# Patient Record
Sex: Female | Born: 1980 | Hispanic: No | Smoking: Current every day smoker
Health system: Southern US, Community
[De-identification: ages and names within clinical notes are randomized; demographics above are authoritative.]

## PROBLEM LIST (undated history)

## (undated) DIAGNOSIS — N6489 Other specified disorders of breast: Secondary | ICD-10-CM

## (undated) DIAGNOSIS — M5030 Other cervical disc degeneration, unspecified cervical region: Secondary | ICD-10-CM

## (undated) DIAGNOSIS — IMO0002 Reserved for concepts with insufficient information to code with codable children: Secondary | ICD-10-CM

## (undated) DIAGNOSIS — K219 Gastro-esophageal reflux disease without esophagitis: Secondary | ICD-10-CM

## (undated) DIAGNOSIS — B373 Candidiasis of vulva and vagina: Secondary | ICD-10-CM

## (undated) DIAGNOSIS — Z889 Allergy status to unspecified drugs, medicaments and biological substances status: Secondary | ICD-10-CM

## (undated) DIAGNOSIS — R519 Headache, unspecified: Secondary | ICD-10-CM

## (undated) DIAGNOSIS — N870 Mild cervical dysplasia: Secondary | ICD-10-CM

## (undated) DIAGNOSIS — R51 Headache: Secondary | ICD-10-CM

## (undated) DIAGNOSIS — Z87440 Personal history of urinary (tract) infections: Secondary | ICD-10-CM

## (undated) DIAGNOSIS — R638 Other symptoms and signs concerning food and fluid intake: Secondary | ICD-10-CM

## (undated) DIAGNOSIS — G35D Multiple sclerosis, unspecified: Secondary | ICD-10-CM

## (undated) DIAGNOSIS — G4730 Sleep apnea, unspecified: Secondary | ICD-10-CM

## (undated) DIAGNOSIS — N926 Irregular menstruation, unspecified: Secondary | ICD-10-CM

## (undated) DIAGNOSIS — E079 Disorder of thyroid, unspecified: Secondary | ICD-10-CM

## (undated) DIAGNOSIS — H539 Unspecified visual disturbance: Secondary | ICD-10-CM

## (undated) DIAGNOSIS — L732 Hidradenitis suppurativa: Secondary | ICD-10-CM

## (undated) DIAGNOSIS — F411 Generalized anxiety disorder: Secondary | ICD-10-CM

## (undated) DIAGNOSIS — F909 Attention-deficit hyperactivity disorder, unspecified type: Secondary | ICD-10-CM

## (undated) DIAGNOSIS — M51369 Other intervertebral disc degeneration, lumbar region without mention of lumbar back pain or lower extremity pain: Secondary | ICD-10-CM

## (undated) DIAGNOSIS — M459 Ankylosing spondylitis of unspecified sites in spine: Secondary | ICD-10-CM

## (undated) DIAGNOSIS — N9089 Other specified noninflammatory disorders of vulva and perineum: Secondary | ICD-10-CM

## (undated) DIAGNOSIS — G90A Postural orthostatic tachycardia syndrome (POTS): Secondary | ICD-10-CM

## (undated) DIAGNOSIS — T4145XA Adverse effect of unspecified anesthetic, initial encounter: Secondary | ICD-10-CM

## (undated) DIAGNOSIS — R5383 Other fatigue: Secondary | ICD-10-CM

## (undated) DIAGNOSIS — T8859XA Other complications of anesthesia, initial encounter: Secondary | ICD-10-CM

## (undated) DIAGNOSIS — E010 Iodine-deficiency related diffuse (endemic) goiter: Secondary | ICD-10-CM

## (undated) DIAGNOSIS — G35 Multiple sclerosis: Secondary | ICD-10-CM

## (undated) DIAGNOSIS — N6019 Diffuse cystic mastopathy of unspecified breast: Secondary | ICD-10-CM

## (undated) DIAGNOSIS — Z8619 Personal history of other infectious and parasitic diseases: Secondary | ICD-10-CM

## (undated) HISTORY — DX: Disorder of thyroid, unspecified: E07.9

## (undated) HISTORY — DX: Mild cervical dysplasia: N87.0

## (undated) HISTORY — DX: Personal history of urinary (tract) infections: Z87.440

## (undated) HISTORY — DX: Personal history of other infectious and parasitic diseases: Z86.19

## (undated) HISTORY — DX: Candidiasis of vulva and vagina: B37.3

## (undated) HISTORY — DX: Other specified noninflammatory disorders of vulva and perineum: N90.89

## (undated) HISTORY — DX: Irregular menstruation, unspecified: N92.6

## (undated) HISTORY — DX: Iodine-deficiency related diffuse (endemic) goiter: E01.0

## (undated) HISTORY — DX: Generalized anxiety disorder: F41.1

## (undated) HISTORY — DX: Ankylosing spondylitis of unspecified sites in spine: M45.9

## (undated) HISTORY — DX: Other cervical disc degeneration, unspecified cervical region: M50.30

## (undated) HISTORY — DX: Gastro-esophageal reflux disease without esophagitis: K21.9

## (undated) HISTORY — DX: Headache, unspecified: R51.9

## (undated) HISTORY — DX: Other fatigue: R53.83

## (undated) HISTORY — DX: Reserved for concepts with insufficient information to code with codable children: IMO0002

## (undated) HISTORY — DX: Other symptoms and signs concerning food and fluid intake: R63.8

## (undated) HISTORY — DX: Adverse effect of unspecified anesthetic, initial encounter: T41.45XA

## (undated) HISTORY — DX: Other complications of anesthesia, initial encounter: T88.59XA

## (undated) HISTORY — DX: Other intervertebral disc degeneration, lumbar region without mention of lumbar back pain or lower extremity pain: M51.369

## (undated) HISTORY — DX: Diffuse cystic mastopathy of unspecified breast: N60.19

## (undated) HISTORY — DX: Other specified disorders of breast: N64.89

## (undated) HISTORY — DX: Allergy status to unspecified drugs, medicaments and biological substances: Z88.9

## (undated) HISTORY — DX: Sleep apnea, unspecified: G47.30

## (undated) HISTORY — DX: Headache: R51

## (undated) HISTORY — DX: Hidradenitis suppurativa: L73.2

## (undated) HISTORY — DX: Attention-deficit hyperactivity disorder, unspecified type: F90.9

## (undated) HISTORY — DX: Postural orthostatic tachycardia syndrome (POTS): G90.A

## (undated) HISTORY — DX: Unspecified visual disturbance: H53.9

---
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
## 1999-04-12 HISTORY — PX: WISDOM TOOTH EXTRACTION: SHX21

## 2000-12-05 ENCOUNTER — Encounter: Payer: Self-pay | Admitting: Internal Medicine

## 2000-12-05 ENCOUNTER — Emergency Department (HOSPITAL_COMMUNITY): Admission: EM | Admit: 2000-12-05 | Discharge: 2000-12-05 | Payer: Self-pay | Admitting: Emergency Medicine

## 2001-08-07 ENCOUNTER — Emergency Department (HOSPITAL_COMMUNITY): Admission: EM | Admit: 2001-08-07 | Discharge: 2001-08-08 | Payer: Self-pay | Admitting: Emergency Medicine

## 2005-04-11 DIAGNOSIS — N6019 Diffuse cystic mastopathy of unspecified breast: Secondary | ICD-10-CM

## 2005-04-11 DIAGNOSIS — IMO0002 Reserved for concepts with insufficient information to code with codable children: Secondary | ICD-10-CM

## 2005-04-11 DIAGNOSIS — R87619 Unspecified abnormal cytological findings in specimens from cervix uteri: Secondary | ICD-10-CM

## 2005-04-11 HISTORY — DX: Unspecified abnormal cytological findings in specimens from cervix uteri: R87.619

## 2005-04-11 HISTORY — DX: Diffuse cystic mastopathy of unspecified breast: N60.19

## 2005-04-11 HISTORY — DX: Reserved for concepts with insufficient information to code with codable children: IMO0002

## 2005-05-09 ENCOUNTER — Other Ambulatory Visit: Admission: RE | Admit: 2005-05-09 | Discharge: 2005-05-09 | Payer: Self-pay | Admitting: Obstetrics and Gynecology

## 2005-05-12 DIAGNOSIS — IMO0002 Reserved for concepts with insufficient information to code with codable children: Secondary | ICD-10-CM

## 2005-05-12 HISTORY — DX: Reserved for concepts with insufficient information to code with codable children: IMO0002

## 2005-06-09 DIAGNOSIS — N870 Mild cervical dysplasia: Secondary | ICD-10-CM

## 2005-06-09 HISTORY — DX: Mild cervical dysplasia: N87.0

## 2005-08-24 ENCOUNTER — Encounter: Admission: RE | Admit: 2005-08-24 | Discharge: 2005-08-24 | Payer: Self-pay | Admitting: Gastroenterology

## 2005-08-31 ENCOUNTER — Encounter: Admission: RE | Admit: 2005-08-31 | Discharge: 2005-08-31 | Payer: Self-pay | Admitting: Gastroenterology

## 2005-10-09 DIAGNOSIS — N926 Irregular menstruation, unspecified: Secondary | ICD-10-CM

## 2005-10-09 HISTORY — DX: Irregular menstruation, unspecified: N92.6

## 2005-10-27 ENCOUNTER — Ambulatory Visit (HOSPITAL_COMMUNITY): Admission: RE | Admit: 2005-10-27 | Discharge: 2005-10-28 | Payer: Self-pay | Admitting: General Surgery

## 2005-10-27 ENCOUNTER — Encounter (INDEPENDENT_AMBULATORY_CARE_PROVIDER_SITE_OTHER): Payer: Self-pay | Admitting: Specialist

## 2005-11-09 DIAGNOSIS — E010 Iodine-deficiency related diffuse (endemic) goiter: Secondary | ICD-10-CM

## 2005-11-09 HISTORY — DX: Iodine-deficiency related diffuse (endemic) goiter: E01.0

## 2005-12-06 ENCOUNTER — Other Ambulatory Visit: Admission: RE | Admit: 2005-12-06 | Discharge: 2005-12-06 | Payer: Self-pay | Admitting: Obstetrics and Gynecology

## 2005-12-28 ENCOUNTER — Encounter: Admission: RE | Admit: 2005-12-28 | Discharge: 2005-12-28 | Payer: Self-pay | Admitting: Obstetrics and Gynecology

## 2006-05-22 ENCOUNTER — Encounter: Admission: RE | Admit: 2006-05-22 | Discharge: 2006-05-22 | Payer: Self-pay | Admitting: Endocrinology

## 2006-07-11 DIAGNOSIS — N9089 Other specified noninflammatory disorders of vulva and perineum: Secondary | ICD-10-CM

## 2006-07-11 DIAGNOSIS — B373 Candidiasis of vulva and vagina: Secondary | ICD-10-CM

## 2006-07-11 DIAGNOSIS — B3731 Acute candidiasis of vulva and vagina: Secondary | ICD-10-CM

## 2006-07-11 HISTORY — DX: Other specified noninflammatory disorders of vulva and perineum: N90.89

## 2006-07-11 HISTORY — DX: Candidiasis of vulva and vagina: B37.3

## 2006-07-11 HISTORY — DX: Acute candidiasis of vulva and vagina: B37.31

## 2007-01-10 DIAGNOSIS — N6489 Other specified disorders of breast: Secondary | ICD-10-CM

## 2007-01-10 HISTORY — DX: Other specified disorders of breast: N64.89

## 2007-01-17 ENCOUNTER — Encounter: Admission: RE | Admit: 2007-01-17 | Discharge: 2007-01-17 | Payer: Self-pay | Admitting: Obstetrics and Gynecology

## 2009-04-11 HISTORY — PX: DILATION AND CURETTAGE OF UTERUS: SHX78

## 2009-09-03 ENCOUNTER — Inpatient Hospital Stay (HOSPITAL_COMMUNITY): Admission: AD | Admit: 2009-09-03 | Discharge: 2009-09-06 | Payer: Self-pay | Admitting: Obstetrics and Gynecology

## 2009-09-23 ENCOUNTER — Ambulatory Visit (HOSPITAL_COMMUNITY): Admission: AD | Admit: 2009-09-23 | Discharge: 2009-09-24 | Payer: Self-pay | Admitting: Obstetrics and Gynecology

## 2009-09-23 ENCOUNTER — Encounter (INDEPENDENT_AMBULATORY_CARE_PROVIDER_SITE_OTHER): Payer: Self-pay | Admitting: Obstetrics and Gynecology

## 2010-05-02 ENCOUNTER — Encounter: Payer: Self-pay | Admitting: Endocrinology

## 2010-06-27 LAB — CBC
HCT: 31.2 % — ABNORMAL LOW (ref 36.0–46.0)
Hemoglobin: 10.9 g/dL — ABNORMAL LOW (ref 12.0–15.0)
MCHC: 34.1 g/dL (ref 30.0–36.0)
MCV: 93.1 fL (ref 78.0–100.0)
MCV: 93.6 fL (ref 78.0–100.0)
RBC: 3.35 MIL/uL — ABNORMAL LOW (ref 3.87–5.11)
WBC: 8 10*3/uL (ref 4.0–10.5)

## 2010-06-27 LAB — DIFFERENTIAL
Basophils Absolute: 0 10*3/uL (ref 0.0–0.1)
Basophils Absolute: 0 10*3/uL (ref 0.0–0.1)
Basophils Relative: 0 % (ref 0–1)
Basophils Relative: 0 % (ref 0–1)
Eosinophils Absolute: 0 10*3/uL (ref 0.0–0.7)
Eosinophils Absolute: 0 10*3/uL (ref 0.0–0.7)
Lymphocytes Relative: 10 % — ABNORMAL LOW (ref 12–46)
Lymphocytes Relative: 9 % — ABNORMAL LOW (ref 12–46)
Lymphs Abs: 1 10*3/uL (ref 0.7–4.0)
Neutro Abs: 7.2 10*3/uL (ref 1.7–7.7)
Neutro Abs: 9.2 10*3/uL — ABNORMAL HIGH (ref 1.7–7.7)
Neutrophils Relative %: 89 % — ABNORMAL HIGH (ref 43–77)

## 2010-06-27 LAB — URINE MICROSCOPIC-ADD ON

## 2010-06-27 LAB — URINALYSIS, ROUTINE W REFLEX MICROSCOPIC
Bilirubin Urine: NEGATIVE
Leukocytes, UA: NEGATIVE
Specific Gravity, Urine: >1.03 — ABNORMAL HIGH (ref 1.005–1.030)

## 2010-06-28 LAB — CBC
Hemoglobin: 10.5 g/dL — ABNORMAL LOW (ref 12.0–15.0)
Hemoglobin: 8.4 g/dL — ABNORMAL LOW (ref 12.0–15.0)
MCHC: 35.2 g/dL (ref 30.0–36.0)
MCHC: 35.5 g/dL (ref 30.0–36.0)
MCV: 94.1 fL (ref 78.0–100.0)
Platelets: 197 10*3/uL (ref 150–400)
Platelets: 240 10*3/uL (ref 150–400)
RBC: 2.53 MIL/uL — ABNORMAL LOW (ref 3.87–5.11)
RBC: 3.19 MIL/uL — ABNORMAL LOW (ref 3.87–5.11)
WBC: 18.1 10*3/uL — ABNORMAL HIGH (ref 4.0–10.5)
WBC: 26.2 10*3/uL — ABNORMAL HIGH (ref 4.0–10.5)

## 2010-08-27 NOTE — Op Note (Signed)
NAME:  Brittney Harrison, Brittney Harrison NO.:  [id number]   MEDICAL RECORD NO.:  [id number]          PATIENT TYPE:  OIB   LOCATION:  6735                         FACILITY:  [facility]   PHYSICIAN:  Cherylynn Ridges, M.D.    DATE OF BIRTH:  04/18/1980   DATE OF PROCEDURE:  10/27/2005  DATE OF DISCHARGE:                                 OPERATIVE REPORT   PREOPERATIVE DIAGNOSIS:  Symptomatic biliary dyskinesia.   POSTOPERATIVE DIAGNOSIS:  Symptomatic biliary dyskinesia.   PROCEDURE:  Laparoscopic cholecystectomy with cholangiogram.   SURGEON:  Dr. Lindie Spruce.   ANESTHESIA:  General endotracheal.   ESTIMATED BLOOD LOSS:  Less than 20 mL.   COMPLICATIONS:  None.   CONDITION:  Stable.   INDICATIONS FOR OPERATION:  The patient is a 30 year old with symptomatic  gallbladder disease and biliary dyskinesia, who now comes in for  laparoscopic cholecystectomy.   FINDINGS:  The patient had some adhesions to the gallbladder from the  omentum.  No evidence of acute cholecystitis with chronic cholecystitis.   OPERATION:  The patient was taken to the operating room, placed on table in  supine position.  After an adequate general anesthetic was administered, she  was prepped and draped in usual sterile manner, exposing the midline and the  right upper quadrant.   A supraumbilical curvilinear incision was made, using #11 blade, and taken  down to the midline fascia.  It was at this level that the fascia was  incised, using #15 blade, and a Kocher clamp placed on each edge of the  incised fascia.  We then bluntly dissected down into the peritoneal cavity.  Once this was done, pursestring suture of 0 Vicryl was passed into the  fascia.  This used to secure in the Emily cannula which was subsequent  passed into the peritoneal cavity.  We insufflated carbon dioxide gas up to  maximal intra-abdominal pressure of 15 mmHg, after the pursestring suture  was in place.  Two right costal margin 5-mm cannulas  in the subxiphoid, the  11/12 mm cannula passed under direct vision.  Once all cannulas were in  place, the patient was placed reverse Trendelenburg.  The gallbladder was  inspected.  We put her left-side down and the dissection begun.   A ratcheted grasper was passed on the gallbladder from the lateral-most  cannula site.  We used it to retract the gallbladder towards the anterior  abdominal wall and the right upper quadrant.  We placed the second grasper  on the infundibulum of the gallbladder and then dissected out the peritoneum  overlying the triangle of Calot and the hepatic duodenal triangle.  We  dissected out the cystic duct, placed a proximal clip along the gallbladder  side, and then made a cholecystodochotomy using laparoscopic scissors.  Once  cholecystodochotomy was made, a Cook catheter was passed through the  anterior abdominal wall and passed into the cholecystodochotomy where it was  secured in place with an Endoclip.  Cholangiogram showed good flow into the  duodenum, good proximal filling, no evidence of filling defects and no  dilatation, and there was  good flow into the duodenum.  Once this was  completed, we removed the clip, removed the cholangiocatheter, and then Endo  clipped the distal cystic duct with minimal difficulty.  We transected the  cystic duct, dissected out the cystic artery, clipped it proximally and  distally, and then transected it.  We dissected out the gallbladder from its  bed with minimal difficulty.  We retrieved it from the supraumbilical site  using a large grasper.  All counts were correct.  We aspirated and irrigated  with saline solution and then removed all cannulas.   The supraumbilical fascial site was closed using the pursestring suture  which was left in place.  The skin at all sites were closed using running  subcuticular stitch of 4-0 Vicryl. Steri-Strips were applied, along with  sterile dressings, and 0.25% Marcaine with epi was  injected at all sites.  All needle, sponge counts and instrument counts were correct.      Cherylynn Ridges, M.D.  Electronically Signed     JOW/MEDQ  D:  10/27/2005  T:  10/27/2005  Job:  [id number]

## 2011-04-12 NOTE — L&D Delivery Note (Signed)
Delivery Note At 4:09 AM a viable female, "Brittney Harrison", was delivered via Vaginal, Spontaneous Delivery (Presentation: ; Occiput Anterior).  APGAR: 8, 9; weight .   Placenta status: Intact, Spontaneous.  Cord: 3 vessels with the following complications: None.  Cord pH: NA Uterus boggy right after placental delivery.  Firmed up with massage, and Cytotech 1000 mcg placed in the rectum for prophylaxis. Methergine 0.2 mg IM given as adjunct treatment.  Uterus firm after that, and bleeding overall WNL.  Anesthesia: Local  Episiotomy: None Lacerations: 2nd degree Suture Repair: 3.0 monocryl Est. Blood Loss (mL): 400 cc  Mom to postpartum.  Baby to skin to skin.[name]  Nigel Bridgeman 11/19/2011, 4:52 AM

## 2011-04-20 LAB — OB RESULTS CONSOLE GC/CHLAMYDIA
Chlamydia: NEGATIVE
Gonorrhea: NEGATIVE

## 2011-06-07 ENCOUNTER — Encounter (INDEPENDENT_AMBULATORY_CARE_PROVIDER_SITE_OTHER): Payer: BC Managed Care – PPO | Admitting: Obstetrics and Gynecology

## 2011-06-07 DIAGNOSIS — Z331 Pregnant state, incidental: Secondary | ICD-10-CM

## 2011-06-29 ENCOUNTER — Other Ambulatory Visit: Payer: Self-pay | Admitting: Obstetrics and Gynecology

## 2011-06-29 ENCOUNTER — Encounter (INDEPENDENT_AMBULATORY_CARE_PROVIDER_SITE_OTHER): Payer: BC Managed Care – PPO | Admitting: Obstetrics and Gynecology

## 2011-06-29 ENCOUNTER — Other Ambulatory Visit: Payer: BC Managed Care – PPO

## 2011-06-29 DIAGNOSIS — Z1389 Encounter for screening for other disorder: Secondary | ICD-10-CM

## 2011-06-29 DIAGNOSIS — Z3480 Encounter for supervision of other normal pregnancy, unspecified trimester: Secondary | ICD-10-CM

## 2011-06-30 ENCOUNTER — Ambulatory Visit (HOSPITAL_COMMUNITY): Admission: RE | Admit: 2011-06-30 | Payer: Self-pay | Source: Ambulatory Visit

## 2011-07-01 ENCOUNTER — Ambulatory Visit (HOSPITAL_COMMUNITY)
Admission: RE | Admit: 2011-07-01 | Discharge: 2011-07-01 | Disposition: A | Discharge status: Home / Self Care | Payer: BC Managed Care – PPO | Source: Ambulatory Visit | Attending: Obstetrics and Gynecology | Admitting: Obstetrics and Gynecology

## 2011-07-01 DIAGNOSIS — O358XX0 Maternal care for other (suspected) fetal abnormality and damage, not applicable or unspecified: Secondary | ICD-10-CM | POA: Insufficient documentation

## 2011-07-01 DIAGNOSIS — Z1389 Encounter for screening for other disorder: Secondary | ICD-10-CM | POA: Insufficient documentation

## 2011-07-01 DIAGNOSIS — Z363 Encounter for antenatal screening for malformations: Secondary | ICD-10-CM | POA: Insufficient documentation

## 2011-07-20 LAB — THYROID PANEL WITH TSH
Free T4: 0.68
TSH: 0.43

## 2011-07-20 LAB — T4, FREE: T4,Free (Direct): 0.68

## 2011-07-21 ENCOUNTER — Other Ambulatory Visit: Payer: Self-pay | Admitting: Obstetrics and Gynecology

## 2011-07-22 LAB — GLUCOSE TOLERANCE, 3 HOURS
Glucose Tolerance, 1 hour: 193 mg/dL — ABNORMAL HIGH (ref 70–189)
Glucose, GTT - 3 Hour: 91 mg/dL (ref 70–144)

## 2011-07-22 NOTE — Progress Notes (Signed)
Quick Note:  Pt barely based her 3hr GTT. Please refer her for nutrition mgmt for diabetic diet and I rec u/s q 4wks in 3rd tri for EFW. Thanks ______

## 2011-07-27 ENCOUNTER — Encounter: Payer: Self-pay | Admitting: Obstetrics and Gynecology

## 2011-07-27 ENCOUNTER — Ambulatory Visit (INDEPENDENT_AMBULATORY_CARE_PROVIDER_SITE_OTHER): Payer: BC Managed Care – PPO | Admitting: Obstetrics and Gynecology

## 2011-07-27 ENCOUNTER — Encounter: Payer: BC Managed Care – PPO | Admitting: Obstetrics and Gynecology

## 2011-07-27 VITALS — BP 108/70 | Ht 68.0 in | Wt 223.0 lb

## 2011-07-27 DIAGNOSIS — G43909 Migraine, unspecified, not intractable, without status migrainosus: Secondary | ICD-10-CM

## 2011-07-27 DIAGNOSIS — K589 Irritable bowel syndrome without diarrhea: Secondary | ICD-10-CM | POA: Insufficient documentation

## 2011-07-27 DIAGNOSIS — Z720 Tobacco use: Secondary | ICD-10-CM | POA: Insufficient documentation

## 2011-07-27 DIAGNOSIS — K219 Gastro-esophageal reflux disease without esophagitis: Secondary | ICD-10-CM

## 2011-07-27 DIAGNOSIS — J45909 Unspecified asthma, uncomplicated: Secondary | ICD-10-CM | POA: Insufficient documentation

## 2011-07-27 DIAGNOSIS — E0590 Thyrotoxicosis, unspecified without thyrotoxic crisis or storm: Secondary | ICD-10-CM | POA: Insufficient documentation

## 2011-07-27 DIAGNOSIS — O09299 Supervision of pregnancy with other poor reproductive or obstetric history, unspecified trimester: Secondary | ICD-10-CM

## 2011-07-27 DIAGNOSIS — Z331 Pregnant state, incidental: Secondary | ICD-10-CM

## 2011-07-27 DIAGNOSIS — E079 Disorder of thyroid, unspecified: Secondary | ICD-10-CM

## 2011-07-27 MED ORDER — HYDROXYZINE PAMOATE 25 MG PO CAPS: 25.0000 mg | ORAL_CAPSULE | Freq: Every evening | ORAL | Status: AC | PRN

## 2011-07-27 NOTE — Patient Instructions (Signed)
Diabetes Meal Planning Guide The diabetes meal planning guide is a tool to help you plan your meals and snacks. It is important for people with diabetes to manage their blood glucose (sugar) levels. Choosing the right foods and the right amounts throughout your day will help control your blood glucose. Eating right can even help you improve your blood pressure and reach or maintain a healthy weight. CARBOHYDRATE COUNTING MADE EASY When you eat carbohydrates, they turn to sugar. This raises your blood glucose level. Counting carbohydrates can help you control this level so you feel better. When you plan your meals by counting carbohydrates, you can have more flexibility in what you eat and balance your medicine with your food intake. Carbohydrate counting simply means adding up the total amount of carbohydrate grams in your meals and snacks. Try to eat about the same amount at each meal. Foods with carbohydrates are listed below. Each portion below is 1 carbohydrate serving or 15 grams of carbohydrates. Ask your dietician how many grams of carbohydrates you should eat at each meal or snack. Grains and Starches  1 slice bread.    English muffin or hotdog/hamburger bun.    cup cold cereal (unsweetened).   ? cup cooked pasta or rice.    cup starchy vegetables (corn, potatoes, peas, beans, winter squash).   1 tortilla (6 inches).    bagel.   1 waffle or pancake (size of a CD).    cup cooked cereal.   4 to 6 small crackers.  *Whole grain is recommended. Fruit  1 cup fresh unsweetened berries, melon, papaya, pineapple.   1 small fresh fruit.    banana or mango.    cup fruit juice (4 oz unsweetened).    cup canned fruit in natural juice or water.   2 tbs dried fruit.   12 to 15 grapes or cherries.  Milk and Yogurt  1 cup fat-free or 1% milk.   1 cup soy milk.   6 oz light yogurt with sugar-free sweetener.   6 oz low-fat soy yogurt.   6 oz plain yogurt.    Vegetables  1 cup raw or  cup cooked is counted as 0 carbohydrates or a "free" food.   If you eat 3 or more servings at 1 meal, count them as 1 carbohydrate serving.  Other Carbohydrates   oz chips or pretzels.    cup ice cream or frozen yogurt.    cup sherbet or sorbet.   2 inch square cake, no frosting.   1 tbs honey, sugar, jam, jelly, or syrup.   2 small cookies.   3 squares of graham crackers.   3 cups popcorn.   6 crackers.   1 cup broth-based soup.   Count 1 cup casserole or other mixed foods as 2 carbohydrate servings.   Foods with less than 20 calories in a serving may be counted as 0 carbohydrates or a "free" food.  You may want to purchase a book or computer software that lists the carbohydrate gram counts of different foods. In addition, the nutrition facts panel on the labels of the foods you eat are a good source of this information. The label will tell you how big the serving size is and the total number of carbohydrate grams you will be eating per serving. Divide this number by 15 to obtain the number of carbohydrate servings in a portion. Remember, 1 carbohydrate serving equals 15 grams of carbohydrate. SERVING SIZES Measuring foods and serving sizes  helps you make sure you are getting the right amount of food. The list below tells how big or small some common serving sizes are.  1 oz.........4 stacked dice.   3 oz........Marland KitchenDeck of cards.   1 tsp.......Marland KitchenTip of little finger.   1 tbs......Marland KitchenMarland KitchenThumb.   2 tbs.......Marland KitchenGolf ball.    cup......Marland KitchenHalf of a fist.   1 cup.......Marland KitchenA fist.  SAMPLE DIABETES MEAL PLAN Below is a sample meal plan that includes foods from the grain and starches, dairy, vegetable, fruit, and meat groups. A dietician can individualize a meal plan to fit your calorie needs and tell you the number of servings needed from each food group. However, controlling the total amount of carbohydrates in your meal or snack is more important than  making sure you include all of the food groups at every meal. You may interchange carbohydrate containing foods (dairy, starches, and fruits). The meal plan below is an example of a 2000 calorie diet using carbohydrate counting. This meal plan has 17 carbohydrate servings. Breakfast  1 cup oatmeal (2 carb servings).    cup light yogurt (1 carb serving).   1 cup blueberries (1 carb serving).    cup almonds.  Snack  1 large apple (2 carb servings).   1 low-fat string cheese stick.  Lunch  Chicken breast salad.   1 cup spinach.    cup chopped tomatoes.   2 oz chicken breast, sliced.   2 tbs low-fat [place] dressing.   12 whole-wheat crackers (2 carb servings).   12 to 15 grapes (1 carb serving).   1 cup low-fat milk (1 carb serving).  Snack  1 cup carrots.    cup hummus (1 carb serving).  Dinner  3 oz broiled salmon.   1 cup brown rice (3 carb servings).  Snack  1  cups steamed broccoli (1 carb serving) drizzled with 1 tsp olive oil and lemon juice.   1 cup light pudding (2 carb servings).  DIABETES MEAL PLANNING WORKSHEET Your dietician can use this worksheet to help you decide how many servings of foods and what types of foods are right for you.  BREAKFAST Food Group and Servings / Carb Servings Grain/Starches __________________________________ Dairy __________________________________________ Vegetable ______________________________________ Fruit ___________________________________________ Meat __________________________________________ Fat ____________________________________________ LUNCH Food Group and Servings / Carb Servings Grain/Starches ___________________________________ Dairy ___________________________________________ Fruit ____________________________________________ Meat ___________________________________________ Fat _____________________________________________ Laural Golden Food Group and Servings / Carb Servings Grain/Starches  ___________________________________ Dairy ___________________________________________ Fruit ____________________________________________ Meat ___________________________________________ Fat _____________________________________________ SNACKS Food Group and Servings / Carb Servings Grain/Starches ___________________________________ Dairy ___________________________________________ Vegetable _______________________________________ Fruit ____________________________________________ Meat ___________________________________________ Fat _____________________________________________ DAILY TOTALS Starches _________________________ Vegetable ________________________ Fruit ____________________________ Dairy ____________________________ Meat ____________________________ Fat ______________________________ Document Released: 12/23/2004 Document Revised: 03/17/2011 Document Reviewed: 11/03/2008 [employer] Patient Information 2012 [place], [place].High Protein Diet A high protein diet means that high protein foods are added to your diet. Getting more protein in the diet is important for a number of reasons. Protein helps the body to build tissue, muscle, and to repair damage. People who have had surgery, injuries such as broken bones, infections, and burns, or illnesses such as cancer, may need more protein in their diet.  SERVING SIZES Measuring foods and serving sizes helps to make sure you are getting the right amount of food. The list below tells how big or small some common serving sizes are.   1 oz.........4 stacked dice.   3 oz........Marland KitchenDeck of cards.   1 tsp.......Marland KitchenTip of little finger.   1 tbs......Marland KitchenMarland KitchenThumb.   2 tbs.......Marland KitchenGolf ball.  cup......Marland KitchenHalf of a fist.   1 cup.......Marland KitchenA fist.  FOOD SOURCES OF PROTEIN Listed below are some food sources of protein and the amount of protein they contain. Your Registered Dietitian can calculate how many grams of protein you need for your medical  condition. High protein foods can be added to the diet at mealtime or as snacks. Be sure to have at least 1 protein-containing food at each meal and snack to ensure adequate intake.  Meats and Meat Substitutes / Protein (g)  3 oz poultry (chicken, [place]) / 26 g   3 oz tuna, canned in water / 26 g   3 oz fish (cod) / 21 g   3 oz red meat (beef, pork) / 21 g   4 oz tofu / 9 g   1 egg / 6 g    cup egg substitute / 5 g   1 cup dried beans / 15 g   1 cup soy milk / 4 g  Dairy / Protein (g)  1 cup milk (skim, 1%, 2%, whole) / 8 g    cup evaporated milk / 9 g   1 cup buttermilk / 8 g   1 cup low-fat plain yogurt / 11 g   1 cup regular plain yogurt / 9 g    cup cottage cheese / 14 g   1 oz cheddar cheese / 7 g  Nuts / Protein (g)  2 tbs peanut butter / 8 g   1 oz peanuts / 7 g   2 tbs cashews / 5 g   2 tbs almonds / 5 g  Document Released: 03/28/2005 Document Revised: 03/17/2011 Document Reviewed: 12/29/2006 [facility] Patient Information 2012 [place], [place].

## 2011-07-27 NOTE — Progress Notes (Signed)
Addended by: Malissa Hippo. on: 07/27/2011 03:14 PM   Modules accepted: Orders

## 2011-07-27 NOTE — Progress Notes (Signed)
Assessment and plan reviewed. Suggest referral to [facility] now.

## 2011-07-27 NOTE — Progress Notes (Signed)
Rv'd 3hr gtt results Fast - 104 1hr=193 elevated 2hr = 162 normal 3hr - 91 normal  Random CBG today=157   Discussed diabetic diet and increase exercise Will repeat 3 hr gtt at 28 weeks Pt tearful secondary to not sleeping well, increase stress from work   A: [redacted]w[redacted]d tra glucosuria  Hx LGA Elevated early 1hr gtt - 3hr -1 value elvated Per pt had thyroid labs done at [facility] endocrine Dr Lucianne Muss - will get results faxed  P: watch FH,  Repeat 3hr gtt at 28wks Pt to see Dr Lucianne Muss at 6wks PP  Rv'd PTL s/s  Rx vistaril

## 2011-07-28 ENCOUNTER — Telehealth: Payer: Self-pay | Admitting: Obstetrics and Gynecology

## 2011-07-28 NOTE — Telephone Encounter (Signed)
Message copied by Mason Jim on Thu Jul 28, 2011  8:45 AM ------      Message from: Malissa Hippo.      Created: Wed Jul 27, 2011  3:14 PM      Regarding: getting thyroid labs       Pt had labs drawn at [facility], Dr Lucianne Muss on 07-22-11      Please get results for chart

## 2011-07-28 NOTE — Telephone Encounter (Signed)
TC to Dr. Remus Blake office.  Requested results from most recent thyroid testing.  Will fax.

## 2011-08-05 ENCOUNTER — Telehealth: Payer: Self-pay

## 2011-08-05 ENCOUNTER — Other Ambulatory Visit: Payer: Self-pay

## 2011-08-05 DIAGNOSIS — O24419 Gestational diabetes mellitus in pregnancy, unspecified control: Secondary | ICD-10-CM

## 2011-08-05 NOTE — Telephone Encounter (Signed)
Order for Nutrition Diabetes diet management was sent thru epic. Pt will be contacted thru Nutrition center. PG CMA

## 2011-08-24 ENCOUNTER — Encounter: Payer: Self-pay | Admitting: Obstetrics and Gynecology

## 2011-08-24 ENCOUNTER — Ambulatory Visit (INDEPENDENT_AMBULATORY_CARE_PROVIDER_SITE_OTHER): Payer: BC Managed Care – PPO | Admitting: Obstetrics and Gynecology

## 2011-08-24 ENCOUNTER — Other Ambulatory Visit: Payer: Self-pay | Admitting: Obstetrics and Gynecology

## 2011-08-24 VITALS — BP 110/78 | Wt 230.0 lb

## 2011-08-24 DIAGNOSIS — Z3490 Encounter for supervision of normal pregnancy, unspecified, unspecified trimester: Secondary | ICD-10-CM

## 2011-08-24 DIAGNOSIS — O368190 Decreased fetal movements, unspecified trimester, not applicable or unspecified: Secondary | ICD-10-CM

## 2011-08-24 NOTE — Progress Notes (Signed)
C/o some days the baby doesn't move at all

## 2011-08-24 NOTE — Progress Notes (Signed)
Decreased FM some days. Repeated 3 hr GTT today--patient not willing to do diabetic testing, etc., unless verified as GDM by 3 hour GTT.   Reviewed recommendation for biweekly NST or weekly BPP for recurrent episodes of decreased FM.  Patient will review cost of each option and Decide which is more acceptable to her.  NST today = reactive, no contractions. After review, patient advises she has been taking Melatonin most nights to help with sleep, with decreased movement noted since beginning that regimen. Recommended to stop Melatonin and observe movement. If issue resolves, no antenatal testing needed. Patient will monitor movement and advise [place] if still notes decrease from previous level.

## 2011-08-25 LAB — GLUCOSE TOLERANCE, 3 HOURS: Glucose Tolerance, 2 hour: 149 mg/dL (ref 70–164)

## 2011-09-02 DIAGNOSIS — Z720 Tobacco use: Secondary | ICD-10-CM

## 2011-09-06 ENCOUNTER — Ambulatory Visit (INDEPENDENT_AMBULATORY_CARE_PROVIDER_SITE_OTHER): Payer: BC Managed Care – PPO | Admitting: Obstetrics and Gynecology

## 2011-09-06 ENCOUNTER — Encounter: Payer: Self-pay | Admitting: Obstetrics and Gynecology

## 2011-09-06 VITALS — BP 128/68 | Wt 230.0 lb

## 2011-09-06 DIAGNOSIS — Z331 Pregnant state, incidental: Secondary | ICD-10-CM

## 2011-09-06 NOTE — Progress Notes (Signed)
Pt has no complaints today / JM

## 2011-09-06 NOTE — Progress Notes (Signed)
Glucola, RPR, hemoglobin today. Return to office in 2 weeks. 3 hour GTT within normal limits. Lots of stress at work.  Support offered.

## 2011-09-13 ENCOUNTER — Other Ambulatory Visit: Payer: Self-pay | Admitting: Obstetrics and Gynecology

## 2011-09-13 ENCOUNTER — Inpatient Hospital Stay (HOSPITAL_COMMUNITY)
Admission: AD | Admit: 2011-09-13 | Discharge: 2011-09-13 | Disposition: A | Discharge status: Home / Self Care | Payer: BC Managed Care – PPO | Source: Ambulatory Visit | Attending: Obstetrics and Gynecology | Admitting: Obstetrics and Gynecology

## 2011-09-13 ENCOUNTER — Telehealth: Payer: Self-pay | Admitting: Obstetrics and Gynecology

## 2011-09-13 ENCOUNTER — Encounter (HOSPITAL_COMMUNITY): Payer: Self-pay | Admitting: *Deleted

## 2011-09-13 DIAGNOSIS — R0602 Shortness of breath: Secondary | ICD-10-CM | POA: Insufficient documentation

## 2011-09-13 DIAGNOSIS — O99891 Other specified diseases and conditions complicating pregnancy: Secondary | ICD-10-CM | POA: Insufficient documentation

## 2011-09-13 DIAGNOSIS — Z331 Pregnant state, incidental: Secondary | ICD-10-CM

## 2011-09-13 DIAGNOSIS — J45901 Unspecified asthma with (acute) exacerbation: Secondary | ICD-10-CM

## 2011-09-13 DIAGNOSIS — J45909 Unspecified asthma, uncomplicated: Secondary | ICD-10-CM | POA: Insufficient documentation

## 2011-09-13 MED ORDER — ALBUTEROL SULFATE (2.5 MG/3ML) 0.083% IN NEBU
2.5000 mg | INHALATION_SOLUTION | RESPIRATORY_TRACT | Status: DC | PRN
  Filled 2011-09-13: qty 0.5
  Filled 2011-09-13: qty 3

## 2011-09-13 MED ORDER — ALBUTEROL SULFATE (5 MG/ML) 0.5% IN NEBU
2.5000 mg | INHALATION_SOLUTION | Freq: Once | RESPIRATORY_TRACT | Status: AC
  Administered 2011-09-13: 2.5 mg via RESPIRATORY_TRACT

## 2011-09-13 MED ORDER — FLUTICASONE-SALMETEROL 100-50 MCG/DOSE IN AEPB: 1.0000 | INHALATION_SPRAY | Freq: Two times a day (BID) | RESPIRATORY_TRACT | Status: DC

## 2011-09-13 MED ORDER — IPRATROPIUM BROMIDE 0.02 % IN SOLN
0.5000 mg | RESPIRATORY_TRACT | Status: DC | PRN
  Administered 2011-09-13 (×2): 0.5 mg via RESPIRATORY_TRACT
  Filled 2011-09-13 (×2): qty 2.5

## 2011-09-13 MED ORDER — ALBUTEROL SULFATE (5 MG/ML) 0.5% IN NEBU
INHALATION_SOLUTION | RESPIRATORY_TRACT | Status: AC
  Filled 2011-09-13: qty 0.5

## 2011-09-13 MED ORDER — PREDNISONE 50 MG PO TABS
60.0000 mg | ORAL_TABLET | Freq: Once | ORAL | Status: AC
  Administered 2011-09-13: 60 mg via ORAL
  Filled 2011-09-13: qty 1

## 2011-09-13 NOTE — MAU Provider Note (Signed)
History     CSN: [id number]  Arrival date & time 09/13/11  1519   None     Chief Complaint  Patient presents with  . Shortness of Breath    HPI  15:32 pm Brittney Harrison is a 31 y.o. female @ [redacted]w[redacted]d gestation who presents to MAU for asthma attack. She contributes the attack this morning to dust where her husband was moving and cleaning things. The attack started approximately 12 noon. She used her albuterol inhaler 3 puffs and it helped the wheezing a little but continues to feel short of breath. The history was provided by the patient.   Past Medical History  Diagnosis Date  . Thyroid disease     Fluxuate between hyper to hypo  . Asthma   . H/O seasonal allergies   . Hx: UTI (urinary tract infection)   . GERD (gastroesophageal reflux disease)   . Increased BMI   . Headache     migraines   . Complication of anesthesia     severe vomiting  . H/O varicella   . Fibrocystic breast 2007  . Fatigue   . 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 Irregular bleeding 10/2005  . 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 Yeast vaginitis 07/2006  . Clitoral irritation 07/2006  . Fullness of breast 01/2007    right    Past Surgical History  Procedure Date  . Cholecystectomy 09/11/91  . Dilation and curettage of uterus   . Tonsillectomy 10/28/97    Family History  Problem Relation Age of Onset  . Heart disease Father   . Alcohol abuse Father   . Cancer Maternal Aunt     Thyroid & breast  . Cancer Paternal Uncle     Esophageal  . Diabetes Maternal Grandmother   . Cancer Maternal Grandmother     Kidney  . Cancer Paternal Grandmother     History  Substance Use Topics  . Smoking status: Former Smoker -- 1.0 packs/day for 10 years    Types: Cigarettes    Quit date: 02/13/2011  . Smokeless tobacco: Never Used  . Alcohol Use: No    OB History    Grav Para Term Preterm Abortions TAB SAB Ect Mult Living   3 1 1  1 1    1       Review of  Systems  Constitutional: Negative for fever, chills, diaphoresis and fatigue.  HENT: Negative for ear pain, congestion, sore throat, facial swelling, neck pain, neck stiffness, dental problem and sinus pressure.   Eyes: Negative for photophobia, pain and discharge.  Respiratory: Positive for cough, chest tightness, shortness of breath and wheezing.   Gastrointestinal: Negative for nausea, vomiting, abdominal pain, diarrhea, constipation and abdominal distention.  Genitourinary: Negative for dysuria, frequency, flank pain, vaginal bleeding, vaginal discharge, difficulty urinating and pelvic pain.  Musculoskeletal: Negative for myalgias, back pain and gait problem.  Skin: Negative for color change and rash.  Neurological: Negative for dizziness, speech difficulty, weakness, light-headedness, numbness and headaches.  Psychiatric/Behavioral: Negative for confusion and agitation. The patient is not nervous/anxious.     Allergies  Codeine and Eye-sed ophthalmic  Home Medications  No current outpatient prescriptions on file.  Breastfeeding? Unknown  Physical Exam  Nursing note and vitals reviewed. Constitutional: She is oriented to person, place, and time. She appears well-developed and well-nourished. No distress.  HENT:  Head: Normocephalic.  Eyes: EOM are normal.  Neck: Normal range of motion. Neck  supple.  Cardiovascular: Normal rate.   Pulmonary/Chest: No respiratory distress.       Breath sound slightly decreased bilateral, no wheezing hear. Prolonged expirations.  Abdominal: Soft. There is no tenderness.       Gravid consistent with dates.  Musculoskeletal: Normal range of motion.  Neurological: She is alert and oriented to person, place, and time. No cranial nerve deficit.  Skin: Skin is warm and dry.  Psychiatric: She has a normal mood and affect. Her behavior is normal. Judgment and thought content normal.   Albuterol and Atrovent neb treatment given by Respiratory  therapy  Nigel Bridgeman, CNM here to assume care and complete evaluation of the patient.    ED Course  Procedures  MDM  Nigel Bridgeman, CNM, assuming care of the patient at 4:20pm  Patient feeling better after initial nebulizer treatment with Albuterol and Ipatroprium, but still "tight" in chest. Breath sounds present bilaterally, but still slightly diminished.  No wheezes. FHR reactive, no decels No UCs.  Filed Vitals:   09/13/11 1536  BP: 125/72  Pulse: 98  Temp: 97.1 F (36.2 C)  TempSrc: Oral  Resp: 18  SpO2: 99%   Just received Prednisone 60 mg po per order.  Will repeat nebulizer treatment now. Patient has allergist, Dr. Merlyn Lot, who has treated her asthma prior to pregnancy--has not had any attack since before pregnancy, has been on no meds except allergy med and inhaler for prn use.  Was previously on inhaled steroid. Anticipate d/c home, with plan to follow-up with allergist ASAP for asthma management. Will start her on Advair for daily use in anticipation of her appointment with her allergist.  Nigel Bridgeman, CNM, [place] 09/13/11 4:50pm.  Addendum: Feeling much better after 2nd nebulizer treatment. Wants to go home. Will d/c home with Rx for Advair, and to follow-up ASAP with Dr. Merlyn Lot. Patient agreeable with plan.  Nigel Bridgeman, CNM, [place] 09/13/11 5:30pm

## 2011-09-13 NOTE — Telephone Encounter (Signed)
Pt called and stated that she had an asthma attack around 12:15 today. Pt has taken 3 puffs of her Albuterol inhaler and was slightly better yet she is gasping for air at the end of a sentence. Pt wanted to know if she could increase med or go to ER. [name] advised pt to got to ER. CNM [name] was on call and notified that pt was coming to MAU. Pt voice understanding. Brittney Harrison

## 2011-09-13 NOTE — Telephone Encounter (Signed)
Pam/epic 

## 2011-09-13 NOTE — MAU Note (Signed)
Respiratory therapy at  Bedside administering a treatment

## 2011-09-13 NOTE — MAU Note (Signed)
Pt states she felt like she was having an asthma attack 1215

## 2011-09-13 NOTE — Discharge Instructions (Signed)
Asthma Attack Prevention HOW CAN ASTHMA BE PREVENTED? Currently, there is no way to prevent asthma from starting. However, you can take steps to control the disease and prevent its symptoms after you have been diagnosed. Learn about your asthma and how to control it. Take an active role to control your asthma by working with your caregiver to create and follow an asthma action plan. An asthma action plan guides you in taking your medicines properly, avoiding factors that make your asthma worse, tracking your level of asthma control, responding to worsening asthma, and seeking emergency care when needed. To track your asthma, keep records of your symptoms, check your peak flow number using a peak flow meter (handheld device that shows how well air moves out of your lungs), and get regular asthma checkups.  Other ways to prevent asthma attacks include:  Use medicines as your caregiver directs.   Identify and avoid things that make your asthma worse (as much as you can).   Keep track of your asthma symptoms and level of control.   Get regular checkups for your asthma.   With your caregiver, write a detailed plan for taking medicines and managing an asthma attack. Then be sure to follow your action plan. Asthma is an ongoing condition that needs regular monitoring and treatment.   Identify and avoid asthma triggers. A number of outdoor allergens and irritants (pollen, mold, cold air, air pollution) can trigger asthma attacks. Find out what causes or makes your asthma worse, and take steps to avoid those triggers (see below).   Monitor your breathing. Learn to recognize warning signs of an attack, such as slight coughing, wheezing or shortness of breath. However, your lung function may already decrease before you notice any signs or symptoms, so regularly measure and record your peak airflow with a home peak flow meter.   Identify and treat attacks early. If you act quickly, you're less likely to have  a severe attack. You will also need less medicine to control your symptoms. When your peak flow measurements decrease and alert you to an upcoming attack, take your medicine as instructed, and immediately stop any activity that may have triggered the attack. If your symptoms do not improve, get medical help.   Pay attention to increasing quick-relief inhaler use. If you find yourself relying on your quick-relief inhaler (such as albuterol), your asthma is not under control. See your caregiver about adjusting your treatment.  IDENTIFY AND CONTROL FACTORS THAT MAKE YOUR ASTHMA WORSE A number of common things can set off or make your asthma symptoms worse (asthma triggers). Keep track of your asthma symptoms for several weeks, detailing all the environmental and emotional factors that are linked with your asthma. When you have an asthma attack, go back to your asthma diary to see which factor, or combination of factors, might have contributed to it. Once you know what these factors are, you can take steps to control many of them.  Allergies: If you have allergies and asthma, it is important to take asthma prevention steps at home. Asthma attacks (worsening of asthma symptoms) can be triggered by allergies, which can cause temporary increased inflammation of your airways. Minimizing contact with the substance to which you are allergic will help prevent an asthma attack. Animal Dander:   Some people are allergic to the flakes of skin or dried saliva from animals with fur or feathers. Keep these pets out of your home.   If you can't keep a pet outdoors, keep the   pet out of your bedroom and other sleeping areas at all times, and keep the door closed.   Remove carpets and furniture covered with cloth from your home. If that is not possible, keep the pet away from fabric-covered furniture and carpets.  Dust Mites:  Many people with asthma are allergic to dust mites. Dust mites are tiny bugs that are found in  every home, in mattresses, pillows, carpets, fabric-covered furniture, bedcovers, clothes, stuffed toys, fabric, and other fabric-covered items.   Cover your mattress in a special dust-proof cover.   Cover your pillow in a special dust-proof cover, or wash the pillow each week in hot water. Water must be hotter than 130 F to kill dust mites. Cold or warm water used with detergent and bleach can also be effective.   Wash the sheets and blankets on your bed each week in hot water.   Try not to sleep or lie on cloth-covered cushions.   Call ahead when traveling and ask for a smoke-free hotel room. Bring your own bedding and pillows, in case the hotel only supplies feather pillows and down comforters, which may contain dust mites and cause asthma symptoms.   Remove carpets from your bedroom and those laid on concrete, if you can.   Keep stuffed toys out of the bed, or wash the toys weekly in hot water or cooler water with detergent and bleach.  Cockroaches:  Many people with asthma are allergic to the droppings and remains of cockroaches.   Keep food and garbage in closed containers. Never leave food out.   Use poison baits, traps, powders, gels, or paste (for example, boric acid).   If a spray is used to kill cockroaches, stay out of the room until the odor goes away.  Indoor Mold:  Fix leaky faucets, pipes, or other sources of water that have mold around them.   Clean moldy surfaces with a cleaner that has bleach in it.  Pollen and Outdoor Mold:  When pollen or mold spore counts are high, try to keep your windows closed.   Stay indoors with windows closed from late morning to afternoon, if you can. Pollen and some mold spore counts are highest at that time.   Ask your caregiver whether you need to take or increase anti-inflammatory medicine before your allergy season starts.  Irritants:   Tobacco smoke is an irritant. If you smoke, ask your caregiver how you can quit. Ask family  members to quit smoking, too. Do not allow smoking in your home or car.   If possible, do not use a wood-burning stove, kerosene heater, or fireplace. Minimize exposure to all sources of smoke, including incense, candles, fires, and fireworks.   Try to stay away from strong odors and sprays, such as perfume, talcum powder, hair spray, and paints.   Decrease humidity in your home and use an indoor air cleaning device. Reduce indoor humidity to below 60 percent. Dehumidifiers or central air conditioners can do this.   Try to have someone else vacuum for you once or twice a week, if you can. Stay out of rooms while they are being vacuumed and for a short while afterward.   If you vacuum, use a dust mask from a hardware store, a double-layered or microfilter vacuum cleaner bag, or a vacuum cleaner with a HEPA filter.   Sulfites in foods and beverages can be irritants. Do not drink beer or wine, or eat dried fruit, processed potatoes, or shrimp if they cause asthma   symptoms.   Cold air can trigger an asthma attack. Cover your nose and mouth with a scarf on cold or windy days.   Several health conditions can make asthma more difficult to manage, including runny nose, sinus infections, reflux disease, psychological stress, and sleep apnea. Your caregiver will treat these conditions, as well.   Avoid close contact with people who have a cold or the flu, since your asthma symptoms may get worse if you catch the infection from them. Wash your hands thoroughly after touching items that may have been handled by people with a respiratory infection.   Get a flu shot every year to protect against the flu virus, which often makes asthma worse for days or weeks. Also get a pneumonia shot once every five to 10 years.  Drugs:  Aspirin and other painkillers can cause asthma attacks. 10% to 20% of people with asthma have sensitivity to aspirin or a group of painkillers called non-steroidal anti-inflammatory drugs  (NSAIDS), such as ibuprofen and naproxen. These drugs are used to treat pain and reduce fevers. Asthma attacks caused by any of these medicines can be severe and even fatal. These drugs must be avoided in people who have known aspirin sensitive asthma. Products with acetaminophen are considered safe for people who have asthma. It is important that people with aspirin sensitivity read labels of all over-the-counter drugs used to treat pain, colds, coughs, and fever.   Beta blockers and ACE inhibitors are other drugs which you should discuss with your caregiver, in relation to your asthma.  ALLERGY SKIN TESTING  Ask your asthma caregiver about allergy skin testing or blood testing (RAST test) to identify the allergens to which you are sensitive. If you are found to have allergies, allergy shots (immunotherapy) for asthma may help prevent future allergies and asthma. With allergy shots, small doses of allergens (substances to which you are allergic) are injected under your skin on a regular schedule. Over a period of time, your body may become used to the allergen and less responsive with asthma symptoms. You can also take measures to minimize your exposure to those allergens. EXERCISE  If you have exercise-induced asthma, or are planning vigorous exercise, or exercise in cold, humid, or dry environments, prevent exercise-induced asthma by following your caregiver's advice regarding asthma treatment before exercising. Document Released: 03/16/2009 Document Revised: 03/17/2011 Document Reviewed: 03/16/2009 [employer] Patient Information 2012 [employer]. 

## 2011-09-20 ENCOUNTER — Ambulatory Visit (INDEPENDENT_AMBULATORY_CARE_PROVIDER_SITE_OTHER): Payer: BC Managed Care – PPO | Admitting: Obstetrics and Gynecology

## 2011-09-20 VITALS — BP 116/70 | Wt 235.0 lb

## 2011-09-20 DIAGNOSIS — Z331 Pregnant state, incidental: Secondary | ICD-10-CM

## 2011-09-20 NOTE — Progress Notes (Signed)
Pt states she has no concerns ?

## 2011-09-20 NOTE — Progress Notes (Signed)
Patient ID: Brittney Harrison, female   DOB: 11-28-1980, 31 y.o.   MRN: [id number] Reviewed s/s preterm labor, srom, vag bleeding, kick counts to report, enc 8 water daily and frequent voids [name], CNM

## 2011-10-06 ENCOUNTER — Ambulatory Visit (INDEPENDENT_AMBULATORY_CARE_PROVIDER_SITE_OTHER): Payer: BC Managed Care – PPO | Admitting: Obstetrics and Gynecology

## 2011-10-06 ENCOUNTER — Encounter: Payer: Self-pay | Admitting: Obstetrics and Gynecology

## 2011-10-06 VITALS — BP 120/80 | Wt 238.0 lb

## 2011-10-06 DIAGNOSIS — E079 Disorder of thyroid, unspecified: Secondary | ICD-10-CM

## 2011-10-06 DIAGNOSIS — R7309 Other abnormal glucose: Secondary | ICD-10-CM

## 2011-10-06 DIAGNOSIS — Z331 Pregnant state, incidental: Secondary | ICD-10-CM

## 2011-10-06 NOTE — Progress Notes (Signed)
C/o vag pinching feeling

## 2011-10-06 NOTE — Patient Instructions (Signed)
Preventing Preterm Labor Preterm labor is when a pregnant woman has contractions that cause the cervix to open, shorten, and thin before 37 weeks of pregnancy. You will have regular contractions (tightening) 2 to 3 minutes apart. This usually causes discomfort or pain. HOME CARE  Eat a healthy diet.   Take your vitamins as told by your doctor.   Drink enough fluids to keep your pee (urine) clear or pale yellow every day.   Get rest and sleep.   Do not have sex if you are at high risk for preterm labor.   Follow your doctor's advice about activity, medicines, and tests.   Avoid stress.   Avoid hard labor or exercise that lasts for a long time.   Do not smoke.  GET HELP RIGHT AWAY IF:   You are having contractions.   You have belly (abdominal) pain.   You have bleeding from your vagina.   You have pain when you pee (urinate).   You have abnormal discharge from your vagina.   You have a temperature by mouth above 102 F (38.9 C).  MAKE SURE YOU:  Understand these instructions.   Will watch your condition.   Will get help if you are not doing well or get worse.  Document Released: 06/24/2008 Document Revised: 03/17/2011 Document Reviewed: 06/24/2008 [employer] Patient Information 2012 [employer].Fetal Movement Counts Patient Name: __________________________________________________ Patient Due Date: ____________________ Kick counts is highly recommended in high risk pregnancies, but it is a good idea for every pregnant woman to do. Start counting fetal movements at 28 weeks of the pregnancy. Fetal movements increase after eating a full meal or eating or drinking something sweet (the blood sugar is higher). It is also important to drink plenty of fluids (well hydrated) before doing the count. Lie on your left side because it helps with the circulation or you can sit in a comfortable chair with your arms over your belly (abdomen) with no distractions around you. DOING THE  COUNT  Try to do the count the same time of day each time you do it.   Mark the day and time, then see how long it takes for you to feel 10 movements (kicks, flutters, swishes, rolls). You should have at least 10 movements within 2 hours. You will most likely feel 10 movements in much less than 2 hours. If you do not, wait an hour and count again. After a couple of days you will see a pattern.   What you are looking for is a change in the pattern or not enough counts in 2 hours. Is it taking longer in time to reach 10 movements?  SEEK MEDICAL CARE IF:  You feel less than 10 counts in 2 hours. Tried twice.   No movement in one hour.   The pattern is changing or taking longer each day to reach 10 counts in 2 hours.   You feel the baby is not moving as it usually does.  Date: ____________ Movements: ____________ Start time: ____________ Finish time: ____________  Date: ____________ Movements: ____________ Start time: ____________ Finish time: ____________ Date: ____________ Movements: ____________ Start time: ____________ Finish time: ____________ Date: ____________ Movements: ____________ Start time: ____________ Finish time: ____________ Date: ____________ Movements: ____________ Start time: ____________ Finish time: ____________ Date: ____________ Movements: ____________ Start time: ____________ Finish time: ____________ Date: ____________ Movements: ____________ Start time: ____________ Finish time: ____________ Date: ____________ Movements: ____________ Start time: ____________ Finish time: ____________  Date: ____________ Movements: ____________ Start time: ____________ Finish time:   ____________ Date: ____________ Movements: ____________ Start time: ____________ Finish time: ____________ Date: ____________ Movements: ____________ Start time: ____________ Finish time: ____________ Date: ____________ Movements: ____________ Start time: ____________ Finish time: ____________ Date:  ____________ Movements: ____________ Start time: ____________ Finish time: ____________ Date: ____________ Movements: ____________ Start time: ____________ Finish time: ____________ Date: ____________ Movements: ____________ Start time: ____________ Finish time: ____________  Date: ____________ Movements: ____________ Start time: ____________ Finish time: ____________ Date: ____________ Movements: ____________ Start time: ____________ Finish time: ____________ Date: ____________ Movements: ____________ Start time: ____________ Finish time: ____________ Date: ____________ Movements: ____________ Start time: ____________ Finish time: ____________ Date: ____________ Movements: ____________ Start time: ____________ Finish time: ____________ Date: ____________ Movements: ____________ Start time: ____________ Finish time: ____________ Date: ____________ Movements: ____________ Start time: ____________ Finish time: ____________  Date: ____________ Movements: ____________ Start time: ____________ Finish time: ____________ Date: ____________ Movements: ____________ Start time: ____________ Finish time: ____________ Date: ____________ Movements: ____________ Start time: ____________ Finish time: ____________ Date: ____________ Movements: ____________ Start time: ____________ Finish time: ____________ Date: ____________ Movements: ____________ Start time: ____________ Finish time: ____________ Date: ____________ Movements: ____________ Start time: ____________ Finish time: ____________ Date: ____________ Movements: ____________ Start time: ____________ Finish time: ____________  Date: ____________ Movements: ____________ Start time: ____________ Finish time: ____________ Date: ____________ Movements: ____________ Start time: ____________ Finish time: ____________ Date: ____________ Movements: ____________ Start time: ____________ Finish time: ____________ Date: ____________ Movements: ____________ Start  time: ____________ Finish time: ____________ Date: ____________ Movements: ____________ Start time: ____________ Finish time: ____________ Date: ____________ Movements: ____________ Start time: ____________ Finish time: ____________ Date: ____________ Movements: ____________ Start time: ____________ Finish time: ____________  Date: ____________ Movements: ____________ Start time: ____________ Finish time: ____________ Date: ____________ Movements: ____________ Start time: ____________ Finish time: ____________ Date: ____________ Movements: ____________ Start time: ____________ Finish time: ____________ Date: ____________ Movements: ____________ Start time: ____________ Finish time: ____________ Date: ____________ Movements: ____________ Start time: ____________ Finish time: ____________ Date: ____________ Movements: ____________ Start time: ____________ Finish time: ____________ Date: ____________ Movements: ____________ Start time: ____________ Finish time: ____________  Date: ____________ Movements: ____________ Start time: ____________ Finish time: ____________ Date: ____________ Movements: ____________ Start time: ____________ Finish time: ____________ Date: ____________ Movements: ____________ Start time: ____________ Finish time: ____________ Date: ____________ Movements: ____________ Start time: ____________ Finish time: ____________ Date: ____________ Movements: ____________ Start time: ____________ Finish time: ____________ Date: ____________ Movements: ____________ Start time: ____________ Finish time: ____________ Date: ____________ Movements: ____________ Start time: ____________ Finish time: ____________  Date: ____________ Movements: ____________ Start time: ____________ Finish time: ____________ Date: ____________ Movements: ____________ Start time: ____________ Finish time: ____________ Date: ____________ Movements: ____________ Start time: ____________ Finish time:  ____________ Date: ____________ Movements: ____________ Start time: ____________ Finish time: ____________ Date: ____________ Movements: ____________ Start time: ____________ Finish time: ____________ Date: ____________ Movements: ____________ Start time: ____________ Finish time: ____________ Document Released: 04/27/2006 Document Revised: 03/17/2011 Document Reviewed: 10/28/2008 [employer] Patient Information 2012 [employer]. 

## 2011-10-06 NOTE — Progress Notes (Signed)
[redacted]w[redacted]d Consider growth [place] for hx LGA Doing well, GFM, c/o occ cervical cramping? VE cl/th/high vtx by leopolds GBS NV rvd PTL and FKC

## 2011-10-14 ENCOUNTER — Telehealth: Payer: Self-pay | Admitting: Obstetrics and Gynecology

## 2011-10-14 NOTE — Telephone Encounter (Signed)
TC from pt.   States had 4 contractions in 45 min, 8-12 minutes apart.  +FM although is usual less during the day.   No vag leakage. Has had 32 oz water.  Per SL, advised to do [facility] for 1 hour after eating and drinking cold liquid.  To call if not at least 6 FM in 1 hour.   To monitor cont.  If having > 6/hour x 2 hour to call or call with any concerns about FM, vag leakage. Pt instructed how to reach after hours. Pt verbalizes comprehension of instructions.

## 2011-10-19 ENCOUNTER — Ambulatory Visit (INDEPENDENT_AMBULATORY_CARE_PROVIDER_SITE_OTHER): Payer: BC Managed Care – PPO | Admitting: Obstetrics and Gynecology

## 2011-10-19 ENCOUNTER — Encounter: Payer: Self-pay | Admitting: Obstetrics and Gynecology

## 2011-10-19 VITALS — BP 120/82 | Wt 237.0 lb

## 2011-10-19 DIAGNOSIS — Z1389 Encounter for screening for other disorder: Secondary | ICD-10-CM

## 2011-10-19 DIAGNOSIS — Z3689 Encounter for other specified antenatal screening: Secondary | ICD-10-CM

## 2011-10-19 DIAGNOSIS — Z331 Pregnant state, incidental: Secondary | ICD-10-CM

## 2011-10-19 NOTE — Progress Notes (Signed)
GBS today No concerns per pt

## 2011-10-19 NOTE — Progress Notes (Signed)
Beta strep sent. Ultrasound next visit for presentation.  Patient has history of a large for gestational age infant. Return office in 1 week. Dr. Stefano Gaul

## 2011-10-24 ENCOUNTER — Telehealth: Payer: Self-pay | Admitting: Obstetrics and Gynecology

## 2011-10-24 NOTE — Telephone Encounter (Signed)
TC from patient--37 weeks, yeast infection.  Can she use Monistat? Seen last week by AVS, reports cervix "starting to open".  Notes from AVS=--1/2 cm. OK to use Monistat, insert applicator just inside the vagina.  Recommend the 7 day course. +FM, no significant contractions, no leaking or bleeding.  CTO, call prn.

## 2011-10-27 ENCOUNTER — Ambulatory Visit (INDEPENDENT_AMBULATORY_CARE_PROVIDER_SITE_OTHER): Payer: BC Managed Care – PPO

## 2011-10-27 ENCOUNTER — Ambulatory Visit (INDEPENDENT_AMBULATORY_CARE_PROVIDER_SITE_OTHER): Payer: BC Managed Care – PPO | Admitting: Obstetrics and Gynecology

## 2011-10-27 ENCOUNTER — Other Ambulatory Visit: Payer: Self-pay | Admitting: Obstetrics and Gynecology

## 2011-10-27 ENCOUNTER — Encounter: Payer: Self-pay | Admitting: Obstetrics and Gynecology

## 2011-10-27 VITALS — BP 116/72 | Wt 238.0 lb

## 2011-10-27 DIAGNOSIS — Z3689 Encounter for other specified antenatal screening: Secondary | ICD-10-CM

## 2011-10-27 DIAGNOSIS — O09299 Supervision of pregnancy with other poor reproductive or obstetric history, unspecified trimester: Secondary | ICD-10-CM

## 2011-10-27 DIAGNOSIS — Z1389 Encounter for screening for other disorder: Secondary | ICD-10-CM

## 2011-10-27 NOTE — Progress Notes (Signed)
Pt states no concerns today, requests cervix check.  Ultrasound EFW: 8LB 7OZ >90TH %ILE Fluid:  Nl  GBS neg

## 2011-10-28 LAB — US OB FOLLOW UP

## 2011-11-01 ENCOUNTER — Ambulatory Visit (INDEPENDENT_AMBULATORY_CARE_PROVIDER_SITE_OTHER): Payer: BC Managed Care – PPO | Admitting: Obstetrics and Gynecology

## 2011-11-01 VITALS — BP 120/72 | Wt 241.0 lb

## 2011-11-01 DIAGNOSIS — Z3490 Encounter for supervision of normal pregnancy, unspecified, unspecified trimester: Secondary | ICD-10-CM

## 2011-11-01 DIAGNOSIS — Z331 Pregnant state, incidental: Secondary | ICD-10-CM

## 2011-11-01 NOTE — Progress Notes (Signed)
C/O Back pain, pt declines cervix check today.  Pt has no other concerns today.

## 2011-11-01 NOTE — Progress Notes (Signed)
[redacted]w[redacted]d No change in vaginal secretions, no contractions. Coping well. ROB x 1 wk

## 2011-11-09 ENCOUNTER — Encounter: Payer: Self-pay | Admitting: Obstetrics and Gynecology

## 2011-11-09 ENCOUNTER — Ambulatory Visit (INDEPENDENT_AMBULATORY_CARE_PROVIDER_SITE_OTHER): Payer: BC Managed Care – PPO | Admitting: Obstetrics and Gynecology

## 2011-11-09 VITALS — BP 140/84 | Wt 236.0 lb

## 2011-11-09 DIAGNOSIS — Z331 Pregnant state, incidental: Secondary | ICD-10-CM

## 2011-11-09 NOTE — Progress Notes (Signed)
No complaints. Cx check. 

## 2011-11-10 ENCOUNTER — Encounter: Payer: Self-pay | Admitting: Obstetrics and Gynecology

## 2011-11-10 NOTE — Progress Notes (Signed)
[redacted]w[redacted]d B/P 140/84. Denies any visual changes, H/A's or epigastric pain. Reviewed s/s'x of labor.  UC, SROM, vag bleeding, kick counts daily, Denies headaches, visual spots or blurring, no epigastric pain, no swelling hands, face, ankles.t. F/U 1 week.  [name], CNM

## 2011-11-14 ENCOUNTER — Telehealth: Payer: Self-pay | Admitting: Obstetrics and Gynecology

## 2011-11-14 NOTE — Telephone Encounter (Signed)
TC to pt. States broke her toe last PM.  Has appt with PCP today. +FM.  To call with any concerns.

## 2011-11-14 NOTE — Telephone Encounter (Signed)
TRIAGE/GEN.QUEST. °

## 2011-11-16 ENCOUNTER — Encounter: Payer: Self-pay | Admitting: Obstetrics and Gynecology

## 2011-11-16 ENCOUNTER — Ambulatory Visit (INDEPENDENT_AMBULATORY_CARE_PROVIDER_SITE_OTHER): Payer: BC Managed Care – PPO | Admitting: Obstetrics and Gynecology

## 2011-11-16 VITALS — BP 138/70 | Wt 246.0 lb

## 2011-11-16 DIAGNOSIS — Z331 Pregnant state, incidental: Secondary | ICD-10-CM

## 2011-11-16 NOTE — Progress Notes (Signed)
Patient ID: Brittney Harrison, female   DOB: Apr 23, 1980, 31 y.o.   MRN: [id number] Reviewed s/s uc, srom, vag bleeding, daily fetal kick counts to report, comfort measures. Encouragged 8 water daily and frequent voids. [name], CNM

## 2011-11-16 NOTE — Progress Notes (Signed)
No complaints. cx check. 

## 2011-11-18 ENCOUNTER — Inpatient Hospital Stay (HOSPITAL_COMMUNITY)
Admission: AD | Admit: 2011-11-18 | Discharge: 2011-11-21 | DRG: 374 | Disposition: A | Discharge status: Home / Self Care | Payer: BC Managed Care – PPO | Source: Ambulatory Visit | Attending: Obstetrics and Gynecology | Admitting: Obstetrics and Gynecology

## 2011-11-18 ENCOUNTER — Telehealth: Payer: Self-pay | Admitting: Obstetrics and Gynecology

## 2011-11-18 ENCOUNTER — Encounter (HOSPITAL_COMMUNITY): Payer: Self-pay | Admitting: *Deleted

## 2011-11-18 DIAGNOSIS — O99892 Other specified diseases and conditions complicating childbirth: Secondary | ICD-10-CM | POA: Diagnosis present

## 2011-11-18 DIAGNOSIS — O09299 Supervision of pregnancy with other poor reproductive or obstetric history, unspecified trimester: Secondary | ICD-10-CM

## 2011-11-18 DIAGNOSIS — O3660X0 Maternal care for excessive fetal growth, unspecified trimester, not applicable or unspecified: Secondary | ICD-10-CM | POA: Diagnosis present

## 2011-11-18 DIAGNOSIS — S92919A Unspecified fracture of unspecified toe(s), initial encounter for closed fracture: Secondary | ICD-10-CM | POA: Diagnosis present

## 2011-11-18 DIAGNOSIS — D649 Anemia, unspecified: Secondary | ICD-10-CM | POA: Diagnosis not present

## 2011-11-18 DIAGNOSIS — R7309 Other abnormal glucose: Secondary | ICD-10-CM

## 2011-11-18 DIAGNOSIS — K589 Irritable bowel syndrome without diarrhea: Secondary | ICD-10-CM

## 2011-11-18 DIAGNOSIS — O9903 Anemia complicating the puerperium: Secondary | ICD-10-CM | POA: Diagnosis not present

## 2011-11-18 DIAGNOSIS — G43909 Migraine, unspecified, not intractable, without status migrainosus: Secondary | ICD-10-CM

## 2011-11-18 DIAGNOSIS — D62 Acute posthemorrhagic anemia: Secondary | ICD-10-CM | POA: Diagnosis not present

## 2011-11-18 DIAGNOSIS — O2650 Maternal hypotension syndrome, unspecified trimester: Secondary | ICD-10-CM | POA: Diagnosis not present

## 2011-11-18 DIAGNOSIS — E079 Disorder of thyroid, unspecified: Secondary | ICD-10-CM

## 2011-11-18 DIAGNOSIS — K219 Gastro-esophageal reflux disease without esophagitis: Secondary | ICD-10-CM

## 2011-11-18 LAB — URINALYSIS, ROUTINE W REFLEX MICROSCOPIC
Bilirubin Urine: NEGATIVE
Ketones, ur: NEGATIVE mg/dL
Nitrite: NEGATIVE
Protein, ur: NEGATIVE mg/dL
pH: 6 (ref 5.0–8.0)

## 2011-11-18 LAB — CBC
Hemoglobin: 13.8 g/dL (ref 12.0–15.0)
MCV: 89.2 fL (ref 78.0–100.0)
Platelets: 199 10*3/uL (ref 150–400)
RBC: 4.54 MIL/uL (ref 3.87–5.11)
WBC: 11.5 10*3/uL — ABNORMAL HIGH (ref 4.0–10.5)

## 2011-11-18 LAB — OB RESULTS CONSOLE HEPATITIS B SURFACE ANTIGEN: Hepatitis B Surface Ag: NEGATIVE

## 2011-11-18 LAB — OB RESULTS CONSOLE RUBELLA ANTIBODY, IGM: Rubella: IMMUNE

## 2011-11-18 LAB — URINE MICROSCOPIC-ADD ON

## 2011-11-18 MED ORDER — ONDANSETRON HCL 4 MG/2ML IJ SOLN: 4.0000 mg | Freq: Four times a day (QID) | INTRAMUSCULAR | Status: DC | PRN

## 2011-11-18 MED ORDER — OXYTOCIN 40 UNITS IN LACTATED RINGERS INFUSION - SIMPLE MED
62.5000 mL/h | Freq: Once | INTRAVENOUS | Status: AC
  Administered 2011-11-19: 62.5 mL/h via INTRAVENOUS

## 2011-11-18 MED ORDER — IBUPROFEN 600 MG PO TABS
600.0000 mg | ORAL_TABLET | Freq: Four times a day (QID) | ORAL | Status: DC | PRN
  Administered 2011-11-19: 600 mg via ORAL
  Filled 2011-11-18: qty 1

## 2011-11-18 MED ORDER — TERBUTALINE SULFATE 1 MG/ML IJ SOLN: 0.2500 mg | Freq: Once | INTRAMUSCULAR | Status: AC | PRN

## 2011-11-18 MED ORDER — OXYTOCIN BOLUS FROM INFUSION
250.0000 mL | Freq: Once | INTRAVENOUS | Status: AC
  Administered 2011-11-19: 250 mL via INTRAVENOUS
  Filled 2011-11-18: qty 500

## 2011-11-18 MED ORDER — LACTATED RINGERS IV SOLN
INTRAVENOUS | Status: DC
  Administered 2011-11-18 (×2): via INTRAVENOUS

## 2011-11-18 MED ORDER — LACTATED RINGERS IV SOLN
500.0000 mL | INTRAVENOUS | Status: DC | PRN
  Administered 2011-11-19: 1000 mL via INTRAVENOUS

## 2011-11-18 MED ORDER — FLEET ENEMA 7-19 GM/118ML RE ENEM: 1.0000 | ENEMA | RECTAL | Status: DC | PRN

## 2011-11-18 MED ORDER — OXYTOCIN 40 UNITS IN LACTATED RINGERS INFUSION - SIMPLE MED
1.0000 m[IU]/min | INTRAVENOUS | Status: DC
  Administered 2011-11-18: 1 m[IU]/min via INTRAVENOUS
  Administered 2011-11-19: 666 m[IU]/min via INTRAVENOUS
  Administered 2011-11-19: 83.333 m[IU]/min via INTRAVENOUS
  Filled 2011-11-18 (×3): qty 1000

## 2011-11-18 MED ORDER — CITRIC ACID-SODIUM CITRATE 334-500 MG/5ML PO SOLN
30.0000 mL | ORAL | Status: DC | PRN
  Administered 2011-11-19: 30 mL via ORAL
  Filled 2011-11-18: qty 15

## 2011-11-18 MED ORDER — LIDOCAINE HCL (PF) 1 % IJ SOLN
30.0000 mL | INTRAMUSCULAR | Status: DC | PRN
  Administered 2011-11-19: 30 mL via SUBCUTANEOUS
  Filled 2011-11-18: qty 30

## 2011-11-18 NOTE — Telephone Encounter (Signed)
Pt called, is 40 wks 4 days, states had awakened @ 0700 today feeling wet.  Went to [facility] noticed mucusy d/c and spotting and had some diarrhea and noticed water in her underwear that was not soaked in.  Ctxs are not regular at this time.  Per SL have pt go to [facility] for AmniSure test, pt voices understanding, MK made aware.

## 2011-11-18 NOTE — H&P (Signed)
Brittney Harrison is a 31 y.o. female presenting for srom leaking started at 1200, with uc, no vag bleeding, with diarrhea x 5 then took imodium, no N,V. Plans natural labor agrees to IV pitocin. History OB History    Grav Para Term Preterm Abortions TAB SAB Ect Mult Living   3 1 1  1 1    1     08/2009 38 6/7 week IUP SVD 9#10 9 hour labor retained placenta Past Medical History  Diagnosis Date  . Thyroid disease     Fluxuate between hyper to hypo  . Asthma   . H/O seasonal allergies   . Hx: UTI (urinary tract infection)   . GERD (gastroesophageal reflux disease)   . Increased BMI   . Headache     migraines   . Complication of anesthesia     severe vomiting  . H/O varicella   . Fibrocystic breast 2007  . Fatigue   . 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 Irregular bleeding 10/2005  . 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 Yeast vaginitis 07/2006  . Clitoral irritation 07/2006  . Fullness of breast 01/2007    right   Past Surgical History  Procedure Date  . Cholecystectomy 09/11/91  . Dilation and curettage of uterus   . Tonsillectomy 10/28/97  . Wisdom tooth extraction 2001   Family History: family history includes Alcohol abuse in her father; Cancer in her maternal aunt, maternal grandmother, paternal grandmother, and paternal uncle; Diabetes in her maternal grandmother; Heart disease in her father; and Hypothyroidism in her mother. Social History:  reports that she quit smoking about 9 months ago. Her smoking use included Cigarettes. She has a 10 pack-year smoking history. She has never used smokeless tobacco. She reports that she does not drink alcohol or use illicit drugs.   Prenatal Transfer Tool  Maternal Diabetes: No Genetic Screening: Declined Maternal Ultrasounds/Referrals: Normal US anatomy 20 weeks WNL anterior placenta Fetal Ultrasounds or other Referrals:  None Maternal Substance Abuse:  No Significant Maternal Medications:   Meds include: Other: see prenatal record for asthma Significant Maternal Lab Results:  None Other Comments:  None  ROS Vag ballotable, bedside US VTX Dilation: 3 Effacement (%): 90 Exam by:: M. Jacquelynn Cree, CNM Blood pressure 101/53, pulse 87, temperature 97.6 F (36.4 C), temperature source Oral, resp. rate 18, height 5\' 8"  (1.727 m), weight 246 lb (111.585 kg), SpO2 99.00%. Exam Physical Exam Calm, no distress, HEENT WNL grossly, lungs clear bilaterally, AP RRR, abd soft nt,no masses, not tympanic bowel sounds active, abdomen nontender, Normal hair distrubition mons pubis,  EGBUS WNL,  sterile speculum exam positive pool clear, +fern vagina pink, moist normal rugae,  normal appearing cervix without discharge or lesions LTC, no cervical motion tenderness, No adnexal masses or tenderness trace edema to lower extremities Fhts 130s LTV mod  uc q 2-6 to irregular mild   Prenatal labs: ABO, Rh:  O positive Antibody:  neg Rubella:  Immune RPR:   NR HBsAg:   Neg HIV:   Neg GBS: NEGATIVE (07/10 1800)   Assessment/Plan: [redacted]w[redacted]d SROM  Routine admission, IV pitocin, collaboration with Dr. Pennie Rushing.   Brittney Harrison 11/18/2011, 3:21 PM

## 2011-11-18 NOTE — Progress Notes (Signed)
  Subjective: Comfortable, aware of contractions but no significant pain.  Objective: BP 128/76  Pulse 82  Temp 98.3 F (36.8 C) (Oral)  Resp 19  Ht 5\' 8"  (1.727 m)  Wt 246 lb (111.585 kg)  BMI 37.40 kg/m2  SpO2 99%      FHT:  Category 1 UC:   q 5 min, mild SVE:   Dilation: 4 Effacement (%): 90 Station: -2;-1 Exam by:: R Simpson RN at 5:40pm Pitocin on 7 mu/min  Labs: Lab Results  Component Value Date   WBC 11.5* 11/18/2011   HGB 13.8 11/18/2011   HCT 40.5 11/18/2011   MCV 89.2 11/18/2011   PLT 199 11/18/2011    Assessment / Plan: SROM at noon, minimal labor GBS negative Possible LGA  Plan: Continue pitocin augmentation Plans non-medicated labor   Honorio Devol 11/18/2011, 8:50 PM

## 2011-11-18 NOTE — Telephone Encounter (Signed)
TRIAGE/OB/WATER BROKE

## 2011-11-18 NOTE — Telephone Encounter (Signed)
Pt called is 40wks 4 days, states

## 2011-11-18 NOTE — MAU Note (Signed)
Pt started having U/C's this am at 0700 and then they stopped.  U/C's restarted at noon with lots of cramping pain, bloody show.  Pt stated she had some clear fluid in her panties requiring to change x2.  Good fetal movement.  Last VE 3/80 per pt. X 2 weeks.

## 2011-11-19 ENCOUNTER — Inpatient Hospital Stay (HOSPITAL_COMMUNITY): Payer: BC Managed Care – PPO | Admitting: Anesthesiology

## 2011-11-19 ENCOUNTER — Encounter (HOSPITAL_COMMUNITY): Payer: Self-pay | Admitting: Anesthesiology

## 2011-11-19 ENCOUNTER — Encounter (HOSPITAL_COMMUNITY): Payer: Self-pay | Admitting: *Deleted

## 2011-11-19 ENCOUNTER — Encounter (HOSPITAL_COMMUNITY)
Admission: AD | Disposition: A | Discharge status: Home / Self Care | Payer: Self-pay | Source: Ambulatory Visit | Attending: Obstetrics and Gynecology

## 2011-11-19 HISTORY — PX: DILATION AND CURETTAGE OF UTERUS: SHX78

## 2011-11-19 LAB — CBC
HCT: 28.8 % — ABNORMAL LOW (ref 36.0–46.0)
Hemoglobin: 10.6 g/dL — ABNORMAL LOW (ref 12.0–15.0)
Hemoglobin: 9.9 g/dL — ABNORMAL LOW (ref 12.0–15.0)
Hemoglobin: 8.7 g/dL — ABNORMAL LOW (ref 12.0–15.0)
MCH: 30.5 pg (ref 26.0–34.0)
MCH: 31.2 pg (ref 26.0–34.0)
MCHC: 33.9 g/dL (ref 30.0–36.0)
MCV: 89.9 fL (ref 78.0–100.0)
MCV: 89.2 fL (ref 78.0–100.0)
RBC: 3.24 MIL/uL — ABNORMAL LOW (ref 3.87–5.11)
RBC: 2.79 MIL/uL — ABNORMAL LOW (ref 3.87–5.11)
WBC: 22.1 10*3/uL — ABNORMAL HIGH (ref 4.0–10.5)
WBC: 21 10*3/uL — ABNORMAL HIGH (ref 4.0–10.5)

## 2011-11-19 LAB — RPR: RPR Ser Ql: NONREACTIVE

## 2011-11-19 SURGERY — DILATION AND CURETTAGE
Anesthesia: Choice | Site: Vagina | Wound class: Clean Contaminated

## 2011-11-19 MED ORDER — CARBOPROST TROMETHAMINE 250 MCG/ML IM SOLN
INTRAMUSCULAR | Status: AC
  Filled 2011-11-19: qty 1

## 2011-11-19 MED ORDER — SIMETHICONE 80 MG PO CHEW: 80.0000 mg | CHEWABLE_TABLET | ORAL | Status: DC | PRN

## 2011-11-19 MED ORDER — DIBUCAINE 1 % RE OINT
1.0000 "application " | TOPICAL_OINTMENT | RECTAL | Status: DC | PRN
  Filled 2011-11-19: qty 28

## 2011-11-19 MED ORDER — PRENATAL MULTIVITAMIN CH
1.0000 | ORAL_TABLET | Freq: Every day | ORAL | Status: DC
  Administered 2011-11-20 – 2011-11-21 (×2): 1 via ORAL
  Filled 2011-11-19 (×2): qty 1

## 2011-11-19 MED ORDER — MIDAZOLAM HCL 2 MG/2ML IJ SOLN
INTRAMUSCULAR | Status: AC
  Filled 2011-11-19: qty 2

## 2011-11-19 MED ORDER — CARBOPROST TROMETHAMINE 250 MCG/ML IM SOLN
250.0000 ug | Freq: Once | INTRAMUSCULAR | Status: AC
  Administered 2011-11-19: 250 ug via INTRAMUSCULAR

## 2011-11-19 MED ORDER — ACETAMINOPHEN 325 MG PO TABS
650.0000 mg | ORAL_TABLET | Freq: Once | ORAL | Status: AC
  Administered 2011-11-19: 650 mg via ORAL

## 2011-11-19 MED ORDER — FENTANYL CITRATE 0.05 MG/ML IJ SOLN: 25.0000 ug | INTRAMUSCULAR | Status: DC | PRN

## 2011-11-19 MED ORDER — METHYLERGONOVINE MALEATE 0.2 MG PO TABS: 0.2000 mg | ORAL_TABLET | Freq: Four times a day (QID) | ORAL | Status: DC

## 2011-11-19 MED ORDER — DIPHENHYDRAMINE HCL 25 MG PO CAPS: 25.0000 mg | ORAL_CAPSULE | Freq: Four times a day (QID) | ORAL | Status: DC | PRN

## 2011-11-19 MED ORDER — MIDAZOLAM HCL 2 MG/2ML IJ SOLN: 0.5000 mg | Freq: Once | INTRAMUSCULAR | Status: DC | PRN

## 2011-11-19 MED ORDER — LOPERAMIDE HCL 2 MG PO CAPS
2.0000 mg | ORAL_CAPSULE | Freq: Four times a day (QID) | ORAL | Status: DC | PRN
  Filled 2011-11-19: qty 1

## 2011-11-19 MED ORDER — MIDAZOLAM HCL 5 MG/5ML IJ SOLN
INTRAMUSCULAR | Status: DC | PRN
  Administered 2011-11-19: 1 mg via INTRAVENOUS

## 2011-11-19 MED ORDER — METHYLERGONOVINE MALEATE 0.2 MG/ML IJ SOLN
0.2000 mg | Freq: Once | INTRAMUSCULAR | Status: AC
  Administered 2011-11-19: 0.2 mg via INTRAMUSCULAR

## 2011-11-19 MED ORDER — PHENYLEPHRINE HCL 10 MG/ML IJ SOLN
INTRAMUSCULAR | Status: DC | PRN
  Administered 2011-11-19: 80 ug via INTRAVENOUS

## 2011-11-19 MED ORDER — ALBUTEROL SULFATE HFA 108 (90 BASE) MCG/ACT IN AERS
2.0000 | INHALATION_SPRAY | RESPIRATORY_TRACT | Status: DC
  Administered 2011-11-19 – 2011-11-21 (×2): 2 via RESPIRATORY_TRACT
  Filled 2011-11-19: qty 6.7

## 2011-11-19 MED ORDER — KETOROLAC TROMETHAMINE 30 MG/ML IJ SOLN: 15.0000 mg | Freq: Once | INTRAMUSCULAR | Status: DC | PRN

## 2011-11-19 MED ORDER — FERROUS SULFATE 325 (65 FE) MG PO TABS
325.0000 mg | ORAL_TABLET | Freq: Two times a day (BID) | ORAL | Status: DC
  Administered 2011-11-20 – 2011-11-21 (×4): 325 mg via ORAL
  Filled 2011-11-19 (×4): qty 1

## 2011-11-19 MED ORDER — DIPHENOXYLATE-ATROPINE 2.5-0.025 MG PO TABS
2.0000 | ORAL_TABLET | Freq: Four times a day (QID) | ORAL | Status: DC | PRN
  Administered 2011-11-19 (×2): 2 via ORAL
  Filled 2011-11-19: qty 2

## 2011-11-19 MED ORDER — ONDANSETRON HCL 4 MG/2ML IJ SOLN: 4.0000 mg | INTRAMUSCULAR | Status: DC | PRN

## 2011-11-19 MED ORDER — METHYLERGONOVINE MALEATE 0.2 MG/ML IJ SOLN
INTRAMUSCULAR | Status: AC
  Filled 2011-11-19: qty 1

## 2011-11-19 MED ORDER — SODIUM CHLORIDE 0.9 % IR SOLN
Status: DC | PRN
  Administered 2011-11-19: 1000 mL

## 2011-11-19 MED ORDER — METHYLERGONOVINE MALEATE 0.2 MG PO TABS
0.2000 mg | ORAL_TABLET | Freq: Four times a day (QID) | ORAL | Status: AC
  Administered 2011-11-19 – 2011-11-20 (×6): 0.2 mg via ORAL
  Filled 2011-11-19 (×7): qty 1

## 2011-11-19 MED ORDER — DIPHENOXYLATE-ATROPINE 2.5-0.025 MG PO TABS
2.0000 | ORAL_TABLET | Freq: Four times a day (QID) | ORAL | Status: DC | PRN
  Filled 2011-11-19: qty 2

## 2011-11-19 MED ORDER — LACTATED RINGERS IV BOLUS (SEPSIS): 500.0000 mL | Freq: Once | INTRAVENOUS | Status: DC

## 2011-11-19 MED ORDER — CEFOTETAN DISODIUM 2 G IJ SOLR
2.0000 g | Freq: Once | INTRAMUSCULAR | Status: DC
  Filled 2011-11-19: qty 2

## 2011-11-19 MED ORDER — OXYTOCIN 10 UNIT/ML IJ SOLN
INTRAMUSCULAR | Status: AC
  Filled 2011-11-19: qty 2

## 2011-11-19 MED ORDER — OXYTOCIN 40 UNITS IN LACTATED RINGERS INFUSION - SIMPLE MED
150.0000 mL/h | INTRAVENOUS | Status: DC
  Administered 2011-11-19: 150 mL/h via INTRAVENOUS

## 2011-11-19 MED ORDER — PROMETHAZINE HCL 25 MG/ML IJ SOLN: 12.5000 mg | INTRAMUSCULAR | Status: DC | PRN

## 2011-11-19 MED ORDER — ONDANSETRON HCL 4 MG PO TABS: 4.0000 mg | ORAL_TABLET | ORAL | Status: DC | PRN

## 2011-11-19 MED ORDER — PHENYLEPHRINE 40 MCG/ML (10ML) SYRINGE FOR IV PUSH (FOR BLOOD PRESSURE SUPPORT)
PREFILLED_SYRINGE | INTRAVENOUS | Status: AC
  Filled 2011-11-19: qty 5

## 2011-11-19 MED ORDER — LACTATED RINGERS IV SOLN
INTRAVENOUS | Status: DC | PRN
  Administered 2011-11-19 (×2): via INTRAVENOUS

## 2011-11-19 MED ORDER — ZOLPIDEM TARTRATE 5 MG PO TABS: 5.0000 mg | ORAL_TABLET | Freq: Every evening | ORAL | Status: DC | PRN

## 2011-11-19 MED ORDER — MISOPROSTOL 200 MCG PO TABS
1000.0000 ug | ORAL_TABLET | Freq: Once | ORAL | Status: AC
  Administered 2011-11-19: 1000 ug via VAGINAL

## 2011-11-19 MED ORDER — BENZOCAINE-MENTHOL 20-0.5 % EX AERO
1.0000 "application " | INHALATION_SPRAY | CUTANEOUS | Status: DC | PRN
  Administered 2011-11-20: 1 via TOPICAL
  Filled 2011-11-19 (×2): qty 56

## 2011-11-19 MED ORDER — HYDROMORPHONE HCL 2 MG PO TABS
2.0000 mg | ORAL_TABLET | ORAL | Status: DC | PRN
  Filled 2011-11-19: qty 1

## 2011-11-19 MED ORDER — IBUPROFEN 600 MG PO TABS
600.0000 mg | ORAL_TABLET | Freq: Four times a day (QID) | ORAL | Status: DC
  Administered 2011-11-19 – 2011-11-21 (×8): 600 mg via ORAL
  Filled 2011-11-19 (×10): qty 1

## 2011-11-19 MED ORDER — ONDANSETRON HCL 4 MG/2ML IJ SOLN
INTRAMUSCULAR | Status: AC
  Filled 2011-11-19: qty 2

## 2011-11-19 MED ORDER — WITCH HAZEL-GLYCERIN EX PADS: 1.0000 "application " | MEDICATED_PAD | CUTANEOUS | Status: DC | PRN

## 2011-11-19 MED ORDER — HYDROMORPHONE HCL PF 1 MG/ML IJ SOLN: 1.0000 mg | INTRAMUSCULAR | Status: DC | PRN

## 2011-11-19 MED ORDER — OXYTOCIN 10 UNIT/ML IJ SOLN
40.0000 [IU] | INTRAVENOUS | Status: DC | PRN
  Administered 2011-11-19: 20 [IU] via INTRAVENOUS

## 2011-11-19 MED ORDER — FENTANYL CITRATE 0.05 MG/ML IJ SOLN
100.0000 ug | Freq: Once | INTRAMUSCULAR | Status: AC
  Administered 2011-11-19: 100 ug via INTRAVENOUS

## 2011-11-19 MED ORDER — PROMETHAZINE HCL 25 MG/ML IJ SOLN: 6.2500 mg | INTRAMUSCULAR | Status: DC | PRN

## 2011-11-19 MED ORDER — SENNOSIDES-DOCUSATE SODIUM 8.6-50 MG PO TABS: 2.0000 | ORAL_TABLET | Freq: Every day | ORAL | Status: DC

## 2011-11-19 MED ORDER — LANOLIN HYDROUS EX OINT: TOPICAL_OINTMENT | CUTANEOUS | Status: DC | PRN

## 2011-11-19 MED ORDER — FENTANYL CITRATE 0.05 MG/ML IJ SOLN
INTRAMUSCULAR | Status: AC
  Filled 2011-11-19: qty 2

## 2011-11-19 MED ORDER — LACTATED RINGERS IV SOLN: INTRAVENOUS | Status: AC

## 2011-11-19 MED ORDER — METHYLERGONOVINE MALEATE 0.2 MG/ML IJ SOLN
INTRAMUSCULAR | Status: DC | PRN
  Administered 2011-11-19: 0.2 mg via INTRAMUSCULAR

## 2011-11-19 MED ORDER — LACTATED RINGERS IV SOLN
INTRAVENOUS | Status: DC
  Administered 2011-11-19: 75 mL/h via INTRAVENOUS

## 2011-11-19 MED ORDER — OXYTOCIN 40 UNITS IN LACTATED RINGERS INFUSION - SIMPLE MED
INTRAVENOUS | Status: AC
  Administered 2011-11-19: 62.5 mL/h via INTRAVENOUS
  Filled 2011-11-19: qty 1000

## 2011-11-19 MED ORDER — FLUTICASONE PROPIONATE HFA 44 MCG/ACT IN AERO
2.0000 | INHALATION_SPRAY | Freq: Two times a day (BID) | RESPIRATORY_TRACT | Status: DC
  Filled 2011-11-19: qty 10.6

## 2011-11-19 MED ORDER — DEXTROSE 5 % IV SOLN
2.0000 g | INTRAVENOUS | Status: DC | PRN
  Administered 2011-11-19: 2 g via INTRAVENOUS

## 2011-11-19 MED ORDER — OXYCODONE-ACETAMINOPHEN 5-325 MG PO TABS: 1.0000 | ORAL_TABLET | ORAL | Status: DC | PRN

## 2011-11-19 MED ORDER — OXYTOCIN 40 UNITS IN LACTATED RINGERS INFUSION - SIMPLE MED
62.5000 mL/h | INTRAVENOUS | Status: DC
  Administered 2011-11-19: 62.5 mL/h via INTRAVENOUS

## 2011-11-19 MED ORDER — ONDANSETRON HCL 4 MG/2ML IJ SOLN
INTRAMUSCULAR | Status: DC | PRN
  Administered 2011-11-19: 4 mg via INTRAVENOUS

## 2011-11-19 MED ORDER — LORATADINE 10 MG PO TABS
10.0000 mg | ORAL_TABLET | Freq: Every day | ORAL | Status: DC
  Administered 2011-11-21: 10 mg via ORAL
  Filled 2011-11-19 (×4): qty 1

## 2011-11-19 MED ORDER — MISOPROSTOL 200 MCG PO TABS
ORAL_TABLET | ORAL | Status: AC
  Filled 2011-11-19: qty 5

## 2011-11-19 MED ORDER — PROMETHAZINE HCL 25 MG/ML IJ SOLN
12.5000 mg | Freq: Once | INTRAMUSCULAR | Status: AC
  Administered 2011-11-19: 12.5 mg via INTRAVENOUS
  Filled 2011-11-19: qty 1

## 2011-11-19 MED ORDER — MEPERIDINE HCL 25 MG/ML IJ SOLN: 6.2500 mg | INTRAMUSCULAR | Status: DC | PRN

## 2011-11-19 MED ORDER — TETANUS-DIPHTH-ACELL PERTUSSIS 5-2.5-18.5 LF-MCG/0.5 IM SUSP
0.5000 mL | Freq: Once | INTRAMUSCULAR | Status: AC
  Administered 2011-11-21: 0.5 mL via INTRAMUSCULAR
  Filled 2011-11-19 (×2): qty 0.5

## 2011-11-19 MED ORDER — BUPIVACAINE IN DEXTROSE 0.75-8.25 % IT SOLN
INTRATHECAL | Status: DC | PRN
  Administered 2011-11-19 (×2): 1 mL via INTRATHECAL

## 2011-11-19 SURGICAL SUPPLY — 14 items
CATH ROBINSON RED A/P 16FR (CATHETERS) ×2 IMPLANT
CLOTH BEACON ORANGE TIMEOUT ST (SAFETY) ×2 IMPLANT
CONTAINER PREFILL 10% NBF 60ML (FORM) ×4 IMPLANT
DILATOR CANAL MILEX (MISCELLANEOUS) IMPLANT
GLOVE SURG SS PI 6.5 STRL IVOR (GLOVE) ×4 IMPLANT
GOWN PREVENTION PLUS LG XLONG (DISPOSABLE) ×4 IMPLANT
NEEDLE SPNL 22GX3.5 QUINCKE BK (NEEDLE) IMPLANT
PACK VAGINAL MINOR WOMEN LF (CUSTOM PROCEDURE TRAY) ×2 IMPLANT
PAD PREP 24X48 CUFFED NSTRL (MISCELLANEOUS) ×2 IMPLANT
SUT VIC AB 2-0 CT1 27 (SUTURE) ×1
SUT VIC AB 2-0 CT1 TAPERPNT 27 (SUTURE) ×1 IMPLANT
SYR CONTROL 10ML LL (SYRINGE) IMPLANT
TOWEL OR 17X24 6PK STRL BLUE (TOWEL DISPOSABLE) ×4 IMPLANT
WATER STERILE IRR 1000ML POUR (IV SOLUTION) ×2 IMPLANT

## 2011-11-19 NOTE — Progress Notes (Addendum)
SUBJECTIVE:I was called to evaluate this postpartum patient at approximately 9 AM.  She delivered at approximately 4 AM and had an immediate postpartum hemorrhage requiring the use of IM Methergine and Cytotec 1000 mcg per rectum.  Her bleeding had stabilized by report and the patient had been able to ambulate to the bathroom without difficulty. Subsequent to that and the nurse midwife had expressed a large amount of clots but felt that she was unable to adequately assess the patient because of the patient's discomfort. She provided the patient analgesia and I proceeded to her room to evaluate her. When I entered the room the patient was sleeping soundly.  OBJECTIVE:at that time her blood pressure was 110/78 pulse was in the 118 to 1:30 range and respirations were 16. O2 saturation was 96-99%. The perineum was covered with blood and there was a large amount of blood on the pad which had been placed only 15-20 minutes previously.  On bimanual examination there was a large clot in the cervix.  The patient clearly had significant discomfort.  I asked permission to extract the clot, but also  gave the patient the option of going to the operating room for additional anesthesia in order to be able to adequately extract the clot and do a curettage. She opted for clot removal in the labor and delivery room I was able to remove a 10 x 10 cm clot from the cervix with minimal bleeding subsequent to that. Hemoglobin at 7:30 was 10 g  ASSESSMENT: Postpartum hemorrhage after delivery of a macrosomic infant with history of the same in previous pregnancy Currently has satisfactory hemoglobin   RECOMMENDATION: Continue IV Pitocin Give a single dose of Hemabate 250 mcg now Recheck hemoglobin in another hour Observe carefully for further bleeding The postpartum hemorrhage protocol has been instituted and the patient has 2 units of packed red blood cells ready in the blood bank. Will reassess for further management in  several hours or as needed

## 2011-11-19 NOTE — Progress Notes (Signed)
  Subjective: Resting quietly on side, trying to sleep.  Aware of contractions, with some slightly more uncomfortable.  Objective: BP 134/80  Pulse 89  Temp 98.2 F (36.8 C) (Oral)  Resp 18  Ht 5\' 8"  (1.727 m)  Wt 246 lb (111.585 kg)  BMI 37.40 kg/m2  SpO2 99%      FHT: Category 1 UC:  Irregular, mild SVE:  Deferred at present Pitocin on 15 mu/min.  Labs: Lab Results  Component Value Date   WBC 11.5* 11/18/2011   HGB 13.8 11/18/2011   HCT 40.5 11/18/2011   MCV 89.2 11/18/2011   PLT 199 11/18/2011    Assessment / Plan: SROM at 12 noon Augmentation in process Continue pitocin Re-evaluate cervix as contractions warrant.   Romona Murdy 11/19/2011, 1:01 AM

## 2011-11-19 NOTE — Progress Notes (Signed)
To OR #4 via stretcher for D+C

## 2011-11-19 NOTE — Progress Notes (Signed)
Called to see patient with boggy uterus and moderate bleeding. Cytotech 1000 mg per rectum had been placed just after delivery of placenta at 4:19am, and Methergine IM given just after that.  Patient reports more uterine cramping now.  Patient alert and oriented. BP 122/78. P 115. Fundus--slightly boggy on palpation.  Firmed up with massage.  Approx 200 cc blood and clots expelled with massage.  Bleeding minimal after that, and uterus firm.  Will continue IV bolus of Pitocin 40 units. Maintain patient in L&D for next hour for frequent assessment. Re-evaluate around 6am for status or prn.

## 2011-11-19 NOTE — Progress Notes (Signed)
Pt up to bathroom; feeling lightheaded and dizzy after voiding. Called for help to assist pt back to bed via wheelchair. Pt became pale and diaphoretic; bleeding moderate amount, no clots expressed. Manfred Arch called to bedside.

## 2011-11-19 NOTE — Progress Notes (Signed)
Called to [facility] to evaluate pt bleeding,  Had rcv'd full bag of 40units pitocin, now just LR running rcv'd IM dose of methergine and cytotec PR Pt pale but alert, c/o some cramping  O: BP 90's/70's, MHR 130's, 02 sat=97% Fundus firm 2-U, mod amt of continuous flow, laceration hemostatic  No clots expressed w bimanual, tho pt did not tolerate exam   A: PPH  900cc EBL at this point @0730  - hgb/hct = 10.6/31.3 plt=174  P: d/w Dr Pennie Rushing Will hang another bag and bolus 40units of pitocin Repeat methergine IM Will give fentanyl and phenergan for pain control and attempt more aggressive bimanual exam CTO in birthing suites at this time

## 2011-11-19 NOTE — Transfer of Care (Signed)
Immediate Anesthesia Transfer of Care Note  Patient: Brittney Harrison  Procedure(s) Performed: Procedure(s) (LRB): DILATATION AND CURETTAGE (N/A)  Patient Location: PACU  Anesthesia Type: Spinal  Level of Consciousness: awake, alert  and oriented  Airway & Oxygen Therapy: Patient Spontanous Breathing  Post-op Assessment: Report given to PACU RN  Post vital signs: Reviewed and stable  Complications: No apparent anesthesia complications

## 2011-11-19 NOTE — Op Note (Signed)
11/18/2011 - 11/19/2011  4:00 PM on 11/19/11  PATIENT:  Brittney Harrison  31 y.o. female  PRE-OPERATIVE DIAGNOSIS:  post partum hemorrhage  POST-OPERATIVE DIAGNOSIS:  post partum hemorrhage  PROCEDURE:  Procedure(s): DILATATION AND CURETTAGE  SURGEON:  Surgeon(s): Hal Morales, MD  ASSISTANTS: none   ANESTHESIA:   spinal  ESTIMATED BLOOD LOSS:<100cc  COMPLICATIONS:none   FINDINGS:the uterus was enlarged to a postpartum size of approximately 20 weeks. The midline perineal laceration remained well repaired from the patient's delivery. There was only a small amount of bleeding from the cervix at the time of initial evaluation under anesthesia appeared a small amount of retained placental tissue was obtained at the time of curettage  BLOOD ADMINISTERED:none  LOCAL MEDICATIONS USED:  NONE  SPECIMEN:  Source of Specimen:  uterine contents products of conception  DISPOSITION OF SPECIMEN:  PATHOLOGY  COUNTS:  YES  DESCRIPTION OF PROCEDURE:  The patient was taken the operating room after appropriate identification placed on the operating table. After the attainment of adequate spinal anesthesia she was placed in the lithotomy position. Perineum and vagina were prepped with multiple areas of Betadine. The indwelling Foley catheter from berthing suites remained in place. The perineum was draped as a sterile field. A graves speculum was placed in the posterior vagina . A single-tooth tenaculum was placed on the anterior cervix, but soon pulled through the anterior cervical tissue and a sponge stick was placed instead.. The cervix was repaired with a running interlocking suture of 0 Vicryl. A large banjo curet was used to curet all quadrants of the uterus yielding a small amount of placental tissue in chunks. A sharp curet was used to remove the dislodged tissue until all products of conception had been removed. All instruments were then removed from the vagina and the patient  was taken to the  recovery room in satisfactory condition having tolerated the procedure well sponge and instrument counts correct. A dose of Methergine 0.2 mg IM was given intraoperatively.  PLAN OF CARE: postpartum recovery on mother-baby unit  PATIENT DISPOSITION:  PACU - hemodynamically stable.   Delay start of Pharmacological VTE agent (>24hrs) due to surgical blood loss or risk of bleeding:  not applicable since patient wore ScD hose during the procedure.    Hal Morales, MD 4:00 PM

## 2011-11-19 NOTE — Anesthesia Preprocedure Evaluation (Signed)
Anesthesia Evaluation  Patient identified by MRN, date of birth, ID band Patient awake    Reviewed: Allergy & Precautions, H&P , Patient's Chart, lab work & pertinent test results  Airway Mallampati: III TM Distance: >3 FB   Mouth opening: Limited Mouth Opening  Dental No notable dental hx.    Pulmonary neg pulmonary ROS, asthma ,  breath sounds clear to auscultation  Pulmonary exam normal       Cardiovascular Exercise Tolerance: Good negative cardio ROS  Rhythm:regular Rate:Normal     Neuro/Psych  Headaches, negative neurological ROS  negative psych ROS   GI/Hepatic negative GI ROS, Neg liver ROS, GERD-  ,  Endo/Other  negative endocrine ROS  Renal/GU negative Renal ROS  negative genitourinary   Musculoskeletal   Abdominal Normal abdominal exam  (+)   Peds  Hematology negative hematology ROS (+)   Anesthesia Other Findings   Reproductive/Obstetrics negative OB ROS                           Anesthesia Physical  Anesthesia Plan  ASA: III and Emergent  Anesthesia Plan: Spinal   Post-op Pain Management:    Induction:   Airway Management Planned:   Additional Equipment:   Intra-op Plan:   Post-operative Plan:   Informed Consent: I have reviewed the patients History and Physical, chart, labs and discussed the procedure including the risks, benefits and alternatives for the proposed anesthesia with the patient or authorized representative who has indicated his/her understanding and acceptance.     Plan Discussed with: Anesthesiologist, CRNA and Surgeon  Anesthesia Plan Comments:         Anesthesia Quick Evaluation

## 2011-11-19 NOTE — Progress Notes (Signed)
Discussed POC for D+C.  Answered questions.  Consent signed for D+C/Possible Bakri Balloon placement.

## 2011-11-19 NOTE — Anesthesia Postprocedure Evaluation (Signed)
Anesthesia Post Note  Patient: Brittney Harrison  Procedure(s) Performed: Procedure(s) (LRB): DILATATION AND CURETTAGE (N/A)  Anesthesia type: Spinal  Patient location: PACU  Post pain: Pain level controlled  Post assessment: Post-op Vital signs reviewed  Last Vitals:  Filed Vitals:   11/19/11 1630  BP: 118/65  Pulse: 100  Temp:   Resp: 19    Post vital signs: Reviewed  Level of consciousness: awake  Complications: No apparent anesthesia complications

## 2011-11-19 NOTE — Progress Notes (Signed)
Called to see patient with syncopal episode while on 2nd trip to [place]. Moderate bleeding noted at that time. Returned to bed, appears pale, but alert and oriented. BP 122/77, P 122.  Chest clear. Heart RRR without murmur Abd soft, NT Uterus--fundus 2 below, firm Lochia--small amount, no clots expressed with fundal massage. Perineum--laceration well-approximated Ext WNL  Plan: Restart running IV--had been saline locked--with LR 300 cc bolus, then 125 cc/hr x 24 hours. CBC now. Maintain in Birthing Suite at present until patient eats and receives IV hydration. Sanda Klein, CNM, will f/u.  Nigel Bridgeman, CNM

## 2011-11-19 NOTE — Anesthesia Preprocedure Evaluation (Deleted)
Anesthesia Evaluation  Patient identified by MRN, date of birth, ID band Patient awake    Reviewed: Allergy & Precautions, H&P , Patient's Chart, lab work & pertinent test results  Airway Mallampati: III TM Distance: >3 FB   Mouth opening: Limited Mouth Opening  Dental No notable dental hx.    Pulmonary neg pulmonary ROS, asthma ,  breath sounds clear to auscultation  Pulmonary exam normal       Cardiovascular Exercise Tolerance: Good negative cardio ROS  Rhythm:regular Rate:Normal     Neuro/Psych  Headaches, negative neurological ROS  negative psych ROS   GI/Hepatic negative GI ROS, Neg liver ROS, GERD-  ,  Endo/Other  negative endocrine ROS  Renal/GU negative Renal ROS  negative genitourinary   Musculoskeletal   Abdominal Normal abdominal exam  (+)   Peds  Hematology negative hematology ROS (+)   Anesthesia Other Findings   Reproductive/Obstetrics negative OB ROS                           Anesthesia Physical  Anesthesia Plan  ASA: III and Emergent  Anesthesia Plan: Spinal   Post-op Pain Management:    Induction:   Airway Management Planned:   Additional Equipment:   Intra-op Plan:   Post-operative Plan:   Informed Consent: I have reviewed the patients History and Physical, chart, labs and discussed the procedure including the risks, benefits and alternatives for the proposed anesthesia with the patient or authorized representative who has indicated his/her understanding and acceptance.     Plan Discussed with: Anesthesiologist, CRNA and Surgeon  Anesthesia Plan Comments:         Anesthesia Quick Evaluation  

## 2011-11-19 NOTE — Anesthesia Procedure Notes (Signed)
Spinal  Patient location during procedure: OR Start time: 11/19/2011 3:23 PM Staffing Anesthesiologist: Brayton Caves R Performed by: anesthesiologist  Preanesthetic Checklist Completed: patient identified, site marked, surgical consent, pre-op evaluation, timeout performed, IV checked, risks and benefits discussed and monitors and equipment checked Spinal Block Patient position: sitting Prep: DuraPrep Patient monitoring: heart rate, cardiac monitor, continuous pulse ox and blood pressure Approach: midline Location: L3-4 Injection technique: single-shot Needle Needle type: Sprotte  Needle gauge: 24 G Needle length: 9 cm Assessment Sensory level: T4 Additional Notes Patient identified.  Risk benefits discussed including failed block, incomplete pain control, headache, nerve damage, paralysis, blood pressure changes, nausea, vomiting, reactions to medication both toxic or allergic, and postpartum back pain.  Patient expressed understanding and wished to proceed.  All questions were answered.  Sterile technique used throughout procedure.  CSF was clear.  No parasthesia or other complications.  Please see nursing notes for vital signs.

## 2011-11-19 NOTE — Progress Notes (Addendum)
Anesthesia at bedside for consult.  S Lillard CNM will be back in 15 minutes to reassess bleeding.

## 2011-11-19 NOTE — Progress Notes (Signed)
Dr Pennie Rushing requested to evaluate pt.

## 2011-11-19 NOTE — OR Nursing (Signed)
Foley catheter in place upon arrival to OR. Urine color-slightly concentrated.

## 2011-11-19 NOTE — Progress Notes (Addendum)
Pt up to bathroom for extreme diarrhea.  Pt refused bedpan and requested to get up to bathroom.  Pt with steady gait.  Pt felt dizzy after using bathroom.  Lowered to floor.  Requested assistance to assist pt to Steady.  Pt back to bed.  VSS. S Lillard CNM updated.

## 2011-11-19 NOTE — Progress Notes (Signed)
Gentle vaginal exam completed.  No clots noted at ext os.  Bleeding has slowed down.  Pt feeling "much better".

## 2011-11-19 NOTE — Progress Notes (Addendum)
SUBJECTIVE: patient complains of feeling lightheaded even with sitting up in bed.  She denies nausea and is very hungry. She ate a small amount of breakfast at around 7:30. She states that she feels worse now than with the postpartum hemorrhage that she had after her first delivery.  She did have a syncopal episode after having an episode of diarrhea earlier this morning. This wasn't witnessed by the nurse and no fall or injury was sustained.  OBJECTIVE:BP 124/73  Pulse 122  Temp 99 F (37.2 C) (Oral)  Resp 16  Ht 5\' 8"  (1.727 m)  Wt 246 lb (111.585 kg)  BMI 37.40 kg/m2  SpO2 99%  Breastfeeding? Unknown Nailbeds lips and ocular beds are pale. The abdomen is soft and the uterus is firm. There is not a significant amount of bleeding on the pad however patient declines internal examination for assessment for clots. Hemoglobin: 13.8 on admission   10.6 at 7:30 AM   9.9 at 10:30 AM   8.7 at 1:30 PM  ASSESSMENT:   Postpartum hemorrhage with a 2 point hemoglobin drop since 7:30 AM Symptomatic anemia History of postpartum hemorrhage with prior pregnancy requiring delayed D&C at 2 weeks postpartum  RECOMMENDATION: I have offered the patient D&C to allow removal of all clots and a possible retained products of conception with the goal of allowing the oxytocic medications to work better. The risks of anesthesia bleeding infection and damage to adjacent organs and uterine perforation were explained in detail to the patient and her husband. She acknowledged understanding as she has had a D&C in the past. She originally had some hesitation at the need to have a regional anesthetic however she now wishes to proceed with D&C. I also gave her the option of delaying D&C until 24 hours after delivery when she would be a candidate for a general anesthetic. She wishes to proceed at this time with D&C. I have also reviewed the principles of a Bakri balloon however I do not feel it is likely this apparatus will  be required

## 2011-11-20 DIAGNOSIS — D62 Acute posthemorrhagic anemia: Secondary | ICD-10-CM | POA: Diagnosis not present

## 2011-11-20 LAB — CBC
HCT: 22.3 % — ABNORMAL LOW (ref 36.0–46.0)
Hemoglobin: 5.6 g/dL — CL (ref 12.0–15.0)
Hemoglobin: 7.5 g/dL — ABNORMAL LOW (ref 12.0–15.0)
MCH: 30.3 pg (ref 26.0–34.0)
MCH: 30 pg (ref 26.0–34.0)
MCHC: 33.6 g/dL (ref 30.0–36.0)
MCV: 90.3 fL (ref 78.0–100.0)
MCV: 89.2 fL (ref 78.0–100.0)
RBC: 1.85 MIL/uL — ABNORMAL LOW (ref 3.87–5.11)

## 2011-11-20 MED ORDER — DIPHENHYDRAMINE HCL 25 MG PO CAPS
25.0000 mg | ORAL_CAPSULE | Freq: Once | ORAL | Status: AC
  Administered 2011-11-20: 25 mg via ORAL
  Filled 2011-11-20: qty 1

## 2011-11-20 MED ORDER — ACETAMINOPHEN 325 MG PO TABS
650.0000 mg | ORAL_TABLET | Freq: Once | ORAL | Status: AC
  Administered 2011-11-20: 650 mg via ORAL
  Filled 2011-11-20: qty 2

## 2011-11-20 NOTE — Addendum Note (Signed)
Addendum  created 11/20/11 0955 by Earmon Phoenix, CRNA   Modules edited:Notes Section

## 2011-11-20 NOTE — Progress Notes (Addendum)
Post Partum Day 1:S/P SVB, with pp hemorrhage, D&C for retained products, LGA infant  Subjective: Up with assistance for pericare--tolerated without syncope, just feels tired.  Foley still in place. Feeding: Breastfeeding going well.   Objective: Blood pressure 125/67, pulse 107, temperature 98.3 F (36.8 C), temperature source Oral, resp. rate 20, height 5\' 8"  (1.727 m), weight 246 lb (111.585 kg), SpO2 99.00%, unknown if currently breastfeeding.  Urine output 3950 since delivery. Foley draining clear urine.  Physical Exam:  General: alert Lochia: appropriate, small amount now, no clots during night. Uterine Fundus: firm, no clots expressed. Incision: 2nd degree perineal laceration healing well DVT Evaluation: No evidence of DVT seen on physical exam. Negative Homan's sign.  SCDs in place.  Hgb this am 5.6, platelets 122. (Hgb 8.7 yesterday).   Filed Vitals:   11/19/11 2143 11/19/11 2146 11/20/11 0230 11/20/11 0615  BP: 117/70 103/70 99/64 125/67  Pulse: 120 128 110 107  Temp:   98.3 F (36.8 C) 98.3 F (36.8 C)  TempSrc:   Oral Oral  Resp: 20 20 20 20   Height:      Weight:      SpO2:   97% 99%     Basename 11/20/11 0520 11/19/11 1320  HGB 5.6* 8.7*  HCT 16.7* 24.9*     Assessment/Plan: Consulted with Dr. Pennie Rushing. Plan transfusion of 2 u PRBC--Dr. Pennie Rushing had discussed this with patient yesterday, with patient agreeable with anticipated plan for transfusion. Has current T&S for 2 units. Reviewed again with patient R&B of transfusion, with patient remaining agreeable with plan. CBC 4 hours after last unit infused.    LOS: 2 days   Nigel Bridgeman 11/20/2011, 7:29 AM

## 2011-11-20 NOTE — Addendum Note (Signed)
Addendum  created 11/20/11 0955 by Drayson Dorko P Irving Lubbers, CRNA   Modules edited:Notes Section    

## 2011-11-20 NOTE — Progress Notes (Signed)
Manfred Arch CNM called and updated on patient's  CBC results this AM, vitals, and condition.  No new orders received at this time.

## 2011-11-20 NOTE — Anesthesia Postprocedure Evaluation (Signed)
  Anesthesia Post-op Note  Patient: Brittney Harrison  Procedure(s) Performed: Procedure(s) (LRB): DILATATION AND CURETTAGE (N/A)  Patient Location: Mother/Baby  Anesthesia Type: Spinal  Level of Consciousness: awake, alert  and oriented  Airway and Oxygen Therapy: Patient Spontanous Breathing  Post-op Pain: mild  Post-op Assessment: Patient's Cardiovascular Status Stable and Respiratory Function Stable  Post-op Vital Signs: stable, PRBC infulsing.  Hgb 5.6 this AM  Complications: No apparent anesthesia complications

## 2011-11-20 NOTE — Progress Notes (Signed)
Post Partum Day 1 Subjective: tolerating PO, + flatus and still feels "exhausted" despite post-transfusion.  Has slept soundly for about an hour.  Still dizzy when tries to get up.  VB stable--has had same pad on since last night.  BF'ng going great.  Foley still in place.  Objective: Blood pressure 123/88, pulse 112, temperature 99 F (37.2 C), temperature source Oral, resp. rate 20, height 5\' 8"  (1.727 m), weight 246 lb (111.585 kg), SpO2 98.00%, unknown if currently breastfeeding. .. Filed Vitals:   11/20/11 1300 11/20/11 1400 11/20/11 1412 11/20/11 1530  BP: 118/80 122/84 116/76 123/88  Pulse: 106 102 108 112  Temp: 98.7 F (37.1 C) 98.7 F (37.1 C) 98.7 F (37.1 C) 99 F (37.2 C)  TempSrc: Oral Oral Oral Oral  Resp: 18 18 18 20   Height:      Weight:      SpO2:    98%   Physical Exam:  General: cooperative, appears stated age, fatigued, no distress and mildly obese Lochia: appropriate, rubra, small amt Uterine Fundus: firm; u/-3 Incision: n/a DVT Evaluation: No evidence of DVT seen on physical exam. Negative Homan's sign.   Basename 11/20/11 1624 11/20/11 0520  HGB 7.5* 5.6*  HCT 22.3* 16.7*    Assessment/Plan: Plan for discharge tomorrow and Breastfeeding Post-transfusion Hgb=7.5.  Pt still symptomatic--rec'd two additional units of PRBC's and pt agreeable to proceed.  R/b/a rev'd.  Pt already on po iron supplement bid.  Has visitors coming and wants to visit w/ them and eat supper before next transfusion started--will plan to begin between 2100-2200 tonight.  Check CBC at 0500 tomorrow AM. Orthostatic VS ordered.  LOS: 2 days   Lashun Ramseyer H 11/20/2011, 6:24 PM

## 2011-11-21 ENCOUNTER — Encounter: Payer: BC Managed Care – PPO | Admitting: Obstetrics and Gynecology

## 2011-11-21 ENCOUNTER — Inpatient Hospital Stay (HOSPITAL_COMMUNITY): Payer: BC Managed Care – PPO

## 2011-11-21 ENCOUNTER — Encounter (HOSPITAL_COMMUNITY): Payer: Self-pay | Admitting: Obstetrics and Gynecology

## 2011-11-21 LAB — CBC
HCT: 25.2 % — ABNORMAL LOW (ref 36.0–46.0)
MCH: 30 pg (ref 26.0–34.0)
MCHC: 34.1 g/dL (ref 30.0–36.0)
MCV: 87.8 fL (ref 78.0–100.0)
RDW: 15.8 % — ABNORMAL HIGH (ref 11.5–15.5)

## 2011-11-21 MED ORDER — MEDROXYPROGESTERONE ACETATE 150 MG/ML IM SUSP
150.0000 mg | Freq: Once | INTRAMUSCULAR | Status: AC
  Administered 2011-11-21: 150 mg via INTRAMUSCULAR
  Filled 2011-11-21: qty 1

## 2011-11-21 MED ORDER — IBUPROFEN 600 MG PO TABS: 600.0000 mg | ORAL_TABLET | Freq: Four times a day (QID) | ORAL | Status: AC | PRN

## 2011-11-21 MED ORDER — FERROUS SULFATE 325 (65 FE) MG PO TABS: 325.0000 mg | ORAL_TABLET | Freq: Two times a day (BID) | ORAL | Status: DC

## 2011-11-21 MED ORDER — MEDROXYPROGESTERONE ACETATE 150 MG/ML IM SUSP: 150.0000 mg | Freq: Once | INTRAMUSCULAR | Status: DC

## 2011-11-21 MED ORDER — MEDROXYPROGESTERONE ACETATE 150 MG/ML IM SUSP: 150.0000 mg | INTRAMUSCULAR | Status: DC

## 2011-11-21 NOTE — Progress Notes (Signed)
Stood pt. At bedside.  She tolerated about two minutes of standing independently.  Pt. Complains of nausea/dizziness with standing.

## 2011-11-21 NOTE — Progress Notes (Signed)
Spoke with pt. During set-up of 4th unit of blood.  She states that she feels significantly better after the infusion of the third bag.  Pt. Appears more alert, speaking at a faster rate.

## 2011-11-21 NOTE — Progress Notes (Signed)
Blood administration not complete 2 hrs. Prior to CBC time entered.  Modified time to be consistent with the 2 hr post-transfusion standard. Entered time for 0700.

## 2011-11-21 NOTE — Discharge Summary (Signed)
Obstetric Discharge Summary Reason for Admission: rupture of membranes Prenatal Procedures: NST and ultrasound Intrapartum Procedures: spontaneous vaginal delivery Postpartum Procedures: transfusion 4 units PRBC and curettage Complications-Operative and Postpartum: 2nd  degree perineal laceration, hemorrhage and anemia Hemoglobin  Date Value Range Status  11/21/2011 8.6* 12.0 - 15.0 g/dL Final     HCT  Date Value Range Status  11/21/2011 25.2* 36.0 - 46.0 % Final   Hospital Course: Admitted on 11/18/11 with SROM. Negative GBS. Progressed after pitocin initiated, with no medication for pain. Delivery was performed by Nigel Bridgeman, CNM, with moderate bleeding just after delivery of the 10 lb 2 oz infant.  A  2nd degree perineal laceration was noted and repaired without difficulty.  Due to moderate bleeding just after delivery, and in light of the patient's previous hx of heavy pp bleeding, Cytotech 1000 mcg and Methergine 0.2 mg IM were administered in the immediate pp course.  She then had a syncopal episode upon initial void attempt, with pp hemorrhage.  She was monitored in L&D for an additional time.  She continued to have heavy bleeding, with a drop in her Hgb to 8.7 from 13.8, and was consented by Dr. Pennie Rushing for a D&E. She tolerated the procedure, done under spinal anesthesia, with a small amount of retained material noted.  Post-procedure, her Hgb reached a nadir of 5.6.  She was subsequently consented for transfusion of 2 units PRBC.  Her Hgb resolved to 7.5, but she remained syncopal and weak.  She received an additional 2 units of PRBC, with Hgb 8.6 on 8/12.   The foley cath was removed at mid-day on 8/12, with patient feeling much better after removal.  She was subsequently able to ambulate to BR without syncope--she still fatigued easily, but felt able to go home and was desirous of d/c on 11/21/11.  She had a follow-up xray of her right foot--she had broken her right little toe the week  prior to her admission for delivery, and her primary MD at [facility] requested she have a follow-up Xray after delivery to ensure no metatarsal involvement.  This was done on the day of discharge to prevent the patient from having to follow-up in the immediate days after d/c.  X ray showed the pre-existing fx of the right little toe, but no extension, and the patient was instructed to follow-up with her primary as needed.  By 11/21/11, patient's physical exam was WNL, and her infant was doing well.  Breastfeeding was going well.  Patient was using Motrin only for pain, was on Fe BID, and wanted DepoProvera before d/c (and planned to continue this after d/c).  She was discharged home in stable condition, with instructions to monitor her status and follow-up with [place] for any concerns or worsening symptoms of fatigue, dizziness, etc.     Physical Exam:  General: alert Lochia: appropriate Uterine Fundus: firm Incision: healing well DVT Evaluation: No evidence of DVT seen on physical exam. Negative Homan's sign.  Discharge Diagnoses: Term Pregnancy-delivered and pp hemorrhage, anemia  Discharge Information: Date: 11/21/2011 Activity: Per [facility] handout Diet: routine Medications: Ibuprofen, Iron and DepoProvera at d/c and q 12 weeks  Condition: stable Instructions: refer to practice specific booklet Discharge to: home Contraception:  DepoProvera at discharge, and q 12 weeks after that Follow-up Information    Follow up with [facility] in 6 weeks. (Call to schedule, and call for any concerns or questions)          Xray report: RADIOLOGY REPORT*  Clinical Data: Injury 6 days ago. Pain along the fifth metatarsal.  RIGHT FOOT COMPLETE - 3+ VIEW  Comparison: None.  Findings: The patient has a fracture through the base of the  proximal phalanx of the little toe. The fracture does not appear  to extend to the articular surface. There is a mild medial  displacement. No other acute bony or joint  abnormality is  identified. Soft tissues are unremarkable.  IMPRESSION:  Acute fracture through the base of the proximal phalanx of the  little toe as described.  Original Report Authenticated By: Bernadene Bell. D'ALESSIO, M.D.   Newborn Data: Live born female  Birth Weight: 10 lb 2.3 oz (4601 g) APGAR: 8, 9  Home with mother.  Nigel Bridgeman 11/21/2011, 2:17 PM

## 2011-11-21 NOTE — Progress Notes (Signed)
Spoke with Erin Sons CNM, pt. Wanting F/C out. Pt. Gets slight nausea when attempting to ambulate. Vickie wants pt. To ambulate to evaluate status. Orders received.

## 2011-11-22 ENCOUNTER — Telehealth: Payer: Self-pay

## 2011-11-22 LAB — TYPE AND SCREEN
Antibody Screen: NEGATIVE
Unit division: 0
Unit division: 0

## 2011-11-22 NOTE — Telephone Encounter (Signed)
Message copied by Janeece Agee on Tue Nov 22, 2011  4:26 PM ------      Message from: Cornelius Moras      Created: Mon Nov 21, 2011  3:58 PM      Regarding: Faxing D/C summary to [place] [facility] [place]       Please print the d/c summary for Brittney Harrison and fax to [facility] at [place].  She needs to follow-up with them regarding her little toe fracture, and I want them to have the official report of the xray I did today prior to her d/c.[name]            Thanks!      VL

## 2011-11-22 NOTE — Telephone Encounter (Signed)
Faxed discharge summary & Xray notes of toe to [facility] [phone] per VL.

## 2011-11-23 ENCOUNTER — Ambulatory Visit (HOSPITAL_COMMUNITY)
Admission: RE | Admit: 2011-11-23 | Discharge: 2011-11-23 | Disposition: A | Discharge status: Home / Self Care | Payer: BC Managed Care – PPO | Source: Ambulatory Visit | Attending: Obstetrics and Gynecology | Admitting: Obstetrics and Gynecology

## 2011-11-23 NOTE — Progress Notes (Signed)
Infant Lactation Consultation Outpatient Visit Note  Patient Name: Eugenia Eldredge Date of Birth: [birth year] Birth Weight:   Gestational Age at Delivery: Gestational Age: <None> Type of Delivery: Vag  Breastfeeding History Frequency of Breastfeeding:  Length of Feeding:  Voids: 4 Stools: 2-3  Supplementing / Method: Pumping:  Type of Pump:   Frequency:  Volume:    Comments:    Consultation Evaluation:  Initial Feeding Assessment: Pre-feed [id number] Post-feed [id number] Amount Transferred 88ml  Comments: Baby is very hungry but mother is having difficulty latching Jocelyn to the breast.  She is frantic and difficult to settle.  Parents report that she has been gassy and won't latch related to this.  LC suspects that mom's breasts are full and therefore challenging to latch onto.  We tried to settle her by offering gloved finger which she would not take, then an sns with small amount of expressed BM was added for a wet suckle on the finger.  She was a bit more enticed and after slowly pushing 2 ml into her mouth allowing time for suck and swallow she settled down.  While Dad and I were working on this Deyani was hand expressing to soften her breast.  When a latch was attempted after these measures she finally did latch and transferred 88 ml from the Lt breast!  She was quite content after this.   Additional Feeding Assessment: Pre-feed Weight: Post-feed Weight: Amount Transferred: Comments:We did position on the Rt breast so that mom could practice.  Total Breast milk Transferred this Visit: 88 ml Total Supplement Given: 2 ml  Additional Interventions:   Follow-Up   PRN   Soyla Dryer 11/23/2011, 4:16 PM

## 2011-12-30 ENCOUNTER — Ambulatory Visit (INDEPENDENT_AMBULATORY_CARE_PROVIDER_SITE_OTHER): Payer: BC Managed Care – PPO | Admitting: Obstetrics and Gynecology

## 2011-12-30 ENCOUNTER — Encounter: Payer: Self-pay | Admitting: Obstetrics and Gynecology

## 2011-12-30 VITALS — BP 122/62 | Ht 68.0 in | Wt 216.0 lb

## 2011-12-30 DIAGNOSIS — D649 Anemia, unspecified: Secondary | ICD-10-CM

## 2011-12-30 LAB — CBC
MCH: 29.7 pg (ref 26.0–34.0)
MCHC: 33.4 g/dL (ref 30.0–36.0)
MCV: 89 fL (ref 78.0–100.0)
Platelets: 235 10*3/uL (ref 150–400)
RBC: 4.71 MIL/uL (ref 3.87–5.11)

## 2011-12-30 NOTE — Progress Notes (Signed)
Brittney Harrison  is 6 weeks postpartum following a spontaneous vaginal delivery at 40 gestational weeks Date: 11/19/2011 female baby named Jocelyn delivered by Nigel Bridgeman.  Breastfeeding: yes Bottlefeeding:  no  Post-partum blues / depression:  no  EPDS score: 8 History of abnormal Pap:  yes  Last Pap: Date  05/09/2011 Gestational diabetes:  no  Contraception:  Desires Depo-Provera  Normal urinary function:  yes Normal GI function:  no Returning to work:  yes

## 2011-12-30 NOTE — Progress Notes (Signed)
Brittney Harrison is a 31 y.o. female who presents for a postpartum visit.  SVB, 10 lb infant, with pph.  Received blood transfusion after delivery and D&C.  Hgb to nadir of 5.6 prior to initial transfusion of 2 u PRBC.  Hgb rose to 7.5, with patient still symptomatic of anemia. 2 additional units were transfused.  Hgb on d/c was 8.6.  Patient reports still having some fatigue--anticipating starting back to work around 10/14.  Concerned about level of iron.  Hx remarkable for: Patient Active Problem List  Diagnosis  . History of macrosomia in infant in prior pregnancy, currently pregnant  . Thyroid disease  . Asthma  . GERD (gastroesophageal reflux disease)  . IBS (irritable bowel syndrome)  . Migraines  . Tobacco abuse  . History of Large for gestational age (LGA)  . Elevated glucose - 3hr w abnl value at 24wks, repeat 3hr at 28wks nl  . Vaginal delivery  . Perineal laceration with delivery, second degree  . PPH (postpartum hemorrhage)  . Anemia due to blood loss, acute--pp hemorrhage      PPDS = 8--denies pp depression, no SI/HI.  Working on relationship with husband.    Contraception plan:  Received Depo at d/c, plans Nexplanon when Depo due for repeat.   I have fully reviewed the prenatal and intrapartum course   Patient has not been sexually active since delivery.   The following portions of the patient's history were reviewed and updated as appropriate: allergies, current medications, past family history, past medical history, past social history, past surgical history and problem list.  Review of Systems Pertinent items are noted in HPI.   Objective:    There were no vitals taken for this visit.  General:  alert, cooperative and no distress     Lungs: clear to auscultation bilaterally  Heart:  regular rate and rhythm, S1, S2 normal, no murmur  Abdomen: soft, non-tender; bowel sounds normal; no masses,  no organomegaly   Vulva:  normal  Vagina: normal vagina  Cervix:   normal  Uterus: normal size, contour, position, consistency, mobility, non-tender, well-involuted  Adnexa:  normal adnexa             Assessment:     Normal postpartum exam.  Hx LGA infant, pph, anemia, transfusion, D&E pp. Pap smear not done at today's visit. Due 1/14.  Plan:  Follow-up in November for Nexplanon insertion prior to when Depo runs out--patient will call to schedule. Check CBC today.  Nigel Bridgeman CNM, [place] 12/30/2011 1:55 PM

## 2012-01-02 ENCOUNTER — Telehealth: Payer: Self-pay | Admitting: Obstetrics and Gynecology

## 2012-01-02 NOTE — Telephone Encounter (Signed)
TC to pt. Informed of normal Hg.  And was much improved. Pt verbalizes comprehension.

## 2012-01-02 NOTE — Telephone Encounter (Signed)
Message copied by Mason Jim on Mon Jan 02, 2012  9:13 AM ------      Message from: Cornelius Moras      Created: Sun Jan 01, 2012 11:28 PM      Regarding: lab results       Please notify patient of normal Hgb (see result).  Much improved!      VL

## 2012-01-16 ENCOUNTER — Telehealth: Payer: Self-pay | Admitting: Obstetrics and Gynecology

## 2012-01-16 NOTE — Telephone Encounter (Signed)
TC TO PT REGARDING MESSAGE. PT STATES THAT SHE IS 8 WEEK POST PAR DUM AND BF AND SHE IS HAVING SOME BLEEDING AND CRAMPING.  PT STATES THAT SHE DID PASS A LARGE CLOT AND PT JUST WANT EVAL TO MAKE SURE EVERYTHING IS OKAY. PT DID RECEIVE A TRANSFUSION WHILE IN THE HOSPITAL AND SHE DID HAVE DEPO PROVERA UPON D/C. SCHEDULE PT FOR EVAL WITH [facility] BECAUSE PT STATES SHE KNOW WHAT HAS ALREADY TAKEN PLACE WHILE IN THE HOSPITAL WITH THE PT. PT VOICED UNDERSTANDING.

## 2012-01-17 ENCOUNTER — Ambulatory Visit (INDEPENDENT_AMBULATORY_CARE_PROVIDER_SITE_OTHER): Payer: BC Managed Care – PPO | Admitting: Obstetrics and Gynecology

## 2012-01-17 ENCOUNTER — Encounter: Payer: Self-pay | Admitting: Obstetrics and Gynecology

## 2012-01-17 VITALS — BP 110/68 | Temp 98.9°F | Wt 212.0 lb

## 2012-01-17 DIAGNOSIS — N949 Unspecified condition associated with female genital organs and menstrual cycle: Secondary | ICD-10-CM

## 2012-01-17 DIAGNOSIS — N926 Irregular menstruation, unspecified: Secondary | ICD-10-CM

## 2012-01-17 DIAGNOSIS — R102 Pelvic and perineal pain: Secondary | ICD-10-CM

## 2012-01-17 LAB — POCT URINALYSIS DIPSTICK
Blood, UA: 2
Glucose, UA: NEGATIVE
Spec Grav, UA: 1.01
Urobilinogen, UA: NEGATIVE

## 2012-01-17 MED ORDER — KETOROLAC TROMETHAMINE 10 MG PO TABS: 10.0000 mg | ORAL_TABLET | Freq: Four times a day (QID) | ORAL | Status: DC | PRN

## 2012-01-17 NOTE — Progress Notes (Signed)
GYN PROBLEM VISIT  Brittney Harrison is a 31 y.o. year old female,G3P2012, who presents for a problem visit.     Subjective:   Had nl postpartum exam, then the following week, had the start of bleeding like menses which became heavier with time through 01/12/12. Pt with severe cramping beginning on 01/13/12. Pt states,"I felt like I was in labor all over again". Pt passed a large clot on 01/15/12. After that menses became lighter,and she has almost no pain or bleeding now. Pt with irregular bldg s/p delivery complicated by pph and postpartum D&C.[name] Pt is on Depo Provera for contraception. CBC at pp visit was nl.  Has had no documented fever.  No pelvic tenderness  Objective:  Wt 212 lb (96.163 kg)  Breastfeeding? Yes  Orthostatic pulse: Lying=72 bpm Sitting=80 bpm Standing=88 bpm  General: alert, cooperative, appears stated age and no distress GI: soft, non-tender; bowel sounds normal; no masses,  no organomegaly  External genitalia: normal general appearance Vaginal: normal mucosa without prolapse or lesions and no blood in vault Cervix: normal appearance Adnexa: normal bimanual exam Uterus: normal single, nontender Rectal: deferred  Assessment:  Single episode of heavy bleeding most likely due to combination of resumed menses with effects of Depo Provera.  Severe pain associated with passage of clot. Will r/o GTN  Plan:  Quantitative HCG.  If positive, will do U/S and workup for GTN If neg and cramps recur will give Toradol Return to office prn if symptoms worsen or fail to improve. Has scheduled Nexplanon and aex   Dierdre Forth, MD  01/17/2012 4:23 PM

## 2012-01-18 ENCOUNTER — Telehealth: Payer: Self-pay | Admitting: Obstetrics and Gynecology

## 2012-01-18 NOTE — Telephone Encounter (Signed)
Spoke with pt rgd lab results. Advised pt that her HCG was Neg . Pt's voice understanding. bt cma

## 2012-02-20 ENCOUNTER — Ambulatory Visit (INDEPENDENT_AMBULATORY_CARE_PROVIDER_SITE_OTHER): Payer: BC Managed Care – PPO | Admitting: Obstetrics and Gynecology

## 2012-02-20 VITALS — BP 108/60 | Temp 99.1°F | Ht 68.0 in | Wt 211.0 lb

## 2012-02-20 DIAGNOSIS — Z309 Encounter for contraceptive management, unspecified: Secondary | ICD-10-CM

## 2012-02-20 DIAGNOSIS — Z30017 Encounter for initial prescription of implantable subdermal contraceptive: Secondary | ICD-10-CM

## 2012-02-20 DIAGNOSIS — R32 Unspecified urinary incontinence: Secondary | ICD-10-CM

## 2012-02-20 LAB — POCT URINALYSIS DIPSTICK
Ketones, UA: NEGATIVE
Leukocytes, UA: NEGATIVE
pH, UA: 6

## 2012-02-20 MED ORDER — ETONOGESTREL 68 MG ~~LOC~~ IMPL
68.0000 mg | DRUG_IMPLANT | Freq: Once | SUBCUTANEOUS | Status: AC
  Administered 2012-02-20: 68 mg via SUBCUTANEOUS

## 2012-02-20 NOTE — Patient Instructions (Signed)
Call [facility] [phone]:  -for temperature of 100.4 degrees Fahrenheit or more -pain not improved with over the counter pain medications (Ibuprofen, Advil, Aleve,     Tylenol or acetaminophen) -for excessive bleeding from insertion site -for excessive swelling redness or green drainage from your insertion site -for any other concerns -keep insertion site clean, dry and covered  for 24 hours -you may remove pressure bandage in 1-4 hours  Use a back-up method of birth control for the next 4 weeks  

## 2012-02-20 NOTE — Progress Notes (Deleted)
LMP: 02/13/2012 INSERTION DATE: 02/20/2012 REMOVAL DATE: 02/20/2015 INSERTION ARM: *** PALPATED AFTER INSERT: {yes no:314532} IS PT SWITCHING FROM HORMONAL BC: {yes no:314532} LOT #: *** EXP: ***

## 2012-02-20 NOTE — Progress Notes (Signed)
31 YO Breastfeeding female for Nexplanon insertion.  Has read the brochure and has reviewed with her PCP and wants to proceed. Patient denies unprotected intercourse in the past 14 days.  States that she continues to have urinary incontinence that is worse than with her previous pregnancy.  States she will leak with cough, laugh or sneeze but also without warning.  Kegel exercises have worsened rather than helped her symptoms.  Reports that with previous pregnancy her symptoms improved after 6 months (so far only 3 months post partum) but the symptoms were not as bad as they are now.  O: Nexplanon inserted per protocol in medial left upper arm without difficulty, device palpated by clinician; dressed with sterile band-aids and 4 x 4 gauze pressure dressing with Kling  Lot #: [id number] UPT: negative U/A- pH-6.0,  SG- 1.005 otherwise negative  A: Nexplanon Insertion     Urinary Incontinence  P:  Reviewed signs and symptoms of infection and wound care        Urine sent for culture       To consult Dr. Pennie Rushing, for patient as to whether she needs to wait until 6 months to have        her incontinence issue evaluated.  Advised her that Dr. Pennie Rushing was not in the office this week.        RTO-1 week follow up on Nexplanon insertion  Aharon Carriere, PA-C

## 2012-02-22 LAB — URINE CULTURE: Colony Count: NO GROWTH

## 2012-02-27 ENCOUNTER — Telehealth: Payer: Self-pay | Admitting: Obstetrics and Gynecology

## 2012-02-27 NOTE — Telephone Encounter (Signed)
Call to patient after consult with Dr. Pennie Rushing about patient's incontinence symptoms.  She cited a second delivery, (as this changes the anatomy) along with the hypoestrogenic state of breast feeding as being factors in her worsening symptoms.  Advised physical therapy as a management option for now. Safety of anticholinergics are not known in breastfeeding so not advisable in this setting and surgery (this close to delivery)  would not be an option.  Left message for patient to call should she have other questions or if she decides to proceed with physical therapy.  Letrell Attwood, PA-C

## 2012-02-28 ENCOUNTER — Ambulatory Visit: Payer: BC Managed Care – PPO | Admitting: Obstetrics and Gynecology

## 2012-03-01 ENCOUNTER — Encounter: Payer: BC Managed Care – PPO | Admitting: Obstetrics and Gynecology

## 2012-03-07 ENCOUNTER — Encounter: Payer: BC Managed Care – PPO | Admitting: Obstetrics and Gynecology

## 2012-06-26 ENCOUNTER — Other Ambulatory Visit: Payer: Self-pay | Admitting: Neurology

## 2012-06-26 DIAGNOSIS — R51 Headache: Secondary | ICD-10-CM

## 2012-06-26 DIAGNOSIS — H4710 Unspecified papilledema: Secondary | ICD-10-CM

## 2012-07-05 ENCOUNTER — Ambulatory Visit
Admission: RE | Admit: 2012-07-05 | Discharge: 2012-07-05 | Disposition: A | Discharge status: Home / Self Care | Payer: BC Managed Care – PPO | Source: Ambulatory Visit | Attending: Neurology | Admitting: Neurology

## 2012-07-05 DIAGNOSIS — R51 Headache: Secondary | ICD-10-CM

## 2012-07-05 DIAGNOSIS — H4710 Unspecified papilledema: Secondary | ICD-10-CM

## 2012-07-05 MED ORDER — GADOBENATE DIMEGLUMINE 529 MG/ML IV SOLN
18.0000 mL | Freq: Once | INTRAVENOUS | Status: AC | PRN
  Administered 2012-07-05: 18 mL via INTRAVENOUS

## 2012-07-06 ENCOUNTER — Other Ambulatory Visit: Payer: Self-pay | Admitting: Neurology

## 2012-07-06 DIAGNOSIS — H4710 Unspecified papilledema: Secondary | ICD-10-CM

## 2012-07-06 DIAGNOSIS — G932 Benign intracranial hypertension: Secondary | ICD-10-CM

## 2012-07-06 DIAGNOSIS — R9089 Other abnormal findings on diagnostic imaging of central nervous system: Secondary | ICD-10-CM

## 2012-07-06 DIAGNOSIS — G35 Multiple sclerosis: Secondary | ICD-10-CM

## 2012-07-13 ENCOUNTER — Ambulatory Visit
Admission: RE | Admit: 2012-07-13 | Discharge: 2012-07-13 | Disposition: A | Discharge status: Home / Self Care | Payer: BC Managed Care – PPO | Source: Ambulatory Visit | Attending: Neurology | Admitting: Neurology

## 2012-07-13 VITALS — BP 107/68 | HR 73

## 2012-07-13 DIAGNOSIS — G932 Benign intracranial hypertension: Secondary | ICD-10-CM

## 2012-07-13 DIAGNOSIS — R51 Headache: Secondary | ICD-10-CM

## 2012-07-13 DIAGNOSIS — H4710 Unspecified papilledema: Secondary | ICD-10-CM

## 2012-07-13 DIAGNOSIS — G43909 Migraine, unspecified, not intractable, without status migrainosus: Secondary | ICD-10-CM

## 2012-07-13 DIAGNOSIS — R9089 Other abnormal findings on diagnostic imaging of central nervous system: Secondary | ICD-10-CM

## 2012-07-13 DIAGNOSIS — G35 Multiple sclerosis: Secondary | ICD-10-CM

## 2012-07-13 DIAGNOSIS — K219 Gastro-esophageal reflux disease without esophagitis: Secondary | ICD-10-CM

## 2012-07-13 DIAGNOSIS — Z720 Tobacco use: Secondary | ICD-10-CM

## 2012-07-13 DIAGNOSIS — E079 Disorder of thyroid, unspecified: Secondary | ICD-10-CM

## 2012-07-13 DIAGNOSIS — K589 Irritable bowel syndrome without diarrhea: Secondary | ICD-10-CM

## 2012-07-13 LAB — CSF CELL COUNT WITH DIFFERENTIAL: Tube #: 4

## 2012-07-13 LAB — PROTEIN, CSF: Total Protein, CSF: 30 mg/dL (ref 15–45)

## 2012-07-13 NOTE — Progress Notes (Addendum)
One tiger-topped gel tube blood drawn for procedure from left [facility] space without difficulty; site unremarkable.  Donell Sievert, RN

## 2012-07-16 ENCOUNTER — Other Ambulatory Visit: Payer: Self-pay | Admitting: Neurology

## 2012-07-16 ENCOUNTER — Telehealth: Payer: Self-pay | Admitting: Radiology

## 2012-07-16 ENCOUNTER — Ambulatory Visit
Admission: RE | Admit: 2012-07-16 | Discharge: 2012-07-16 | Disposition: A | Discharge status: Home / Self Care | Payer: BC Managed Care – PPO | Source: Ambulatory Visit | Attending: Neurology | Admitting: Neurology

## 2012-07-16 DIAGNOSIS — G971 Other reaction to spinal and lumbar puncture: Secondary | ICD-10-CM

## 2012-07-16 LAB — CSF CULTURE W GRAM STAIN: Organism ID, Bacteria: NO GROWTH

## 2012-07-16 MED ORDER — IOHEXOL 180 MG/ML  SOLN
1.0000 mL | Freq: Once | INTRAMUSCULAR | Status: AC | PRN
  Administered 2012-07-16: 1 mL via EPIDURAL

## 2012-07-16 NOTE — Telephone Encounter (Signed)
Called dr. lewit's office, pt is scheduled to see pt this am. Receptionist will talk to Dr. Clarisse Gouge about blood patch sometime today.

## 2012-07-16 NOTE — Progress Notes (Signed)
Discharge instructions explained, work excuse written. Sister Tresa Endo to bedside.

## 2012-07-16 NOTE — Telephone Encounter (Signed)
Pt has positional headache and has been on bedrest all weekend. Pt talked to Dr. Deanne Coffer over the weekend and asked [place] to get her in for blood patch. Dr. lewit's office called re: blood patch.

## 2012-07-17 LAB — MULTIPLE SCLEROSIS PANEL 2
IgA CSF: 0.15 mg/dL (ref 0.15–0.60)
IgG Total CSF: 2.4 mg/dL (ref 0.5–6.1)
IgG Total: 957 mg/dL (ref 694–1618)
IgG-Index: 0.73 — ABNORMAL HIGH (ref <0.70)
IgG: 0.15 mg/dL — ABNORMAL HIGH (ref <0.10)
IgM Total: 102 mg/dL (ref 48–271)

## 2013-07-27 IMAGING — CR DG FOOT COMPLETE 3+V*R*
3 series · 3 of 3 positions shown · non-contrast
Comparison: None.

CLINICAL DATA: Injury 6 days ago.  Pain along the fifth metatarsal.

RIGHT FOOT COMPLETE - 3+ VIEW

[view not recorded (1 of 3)]
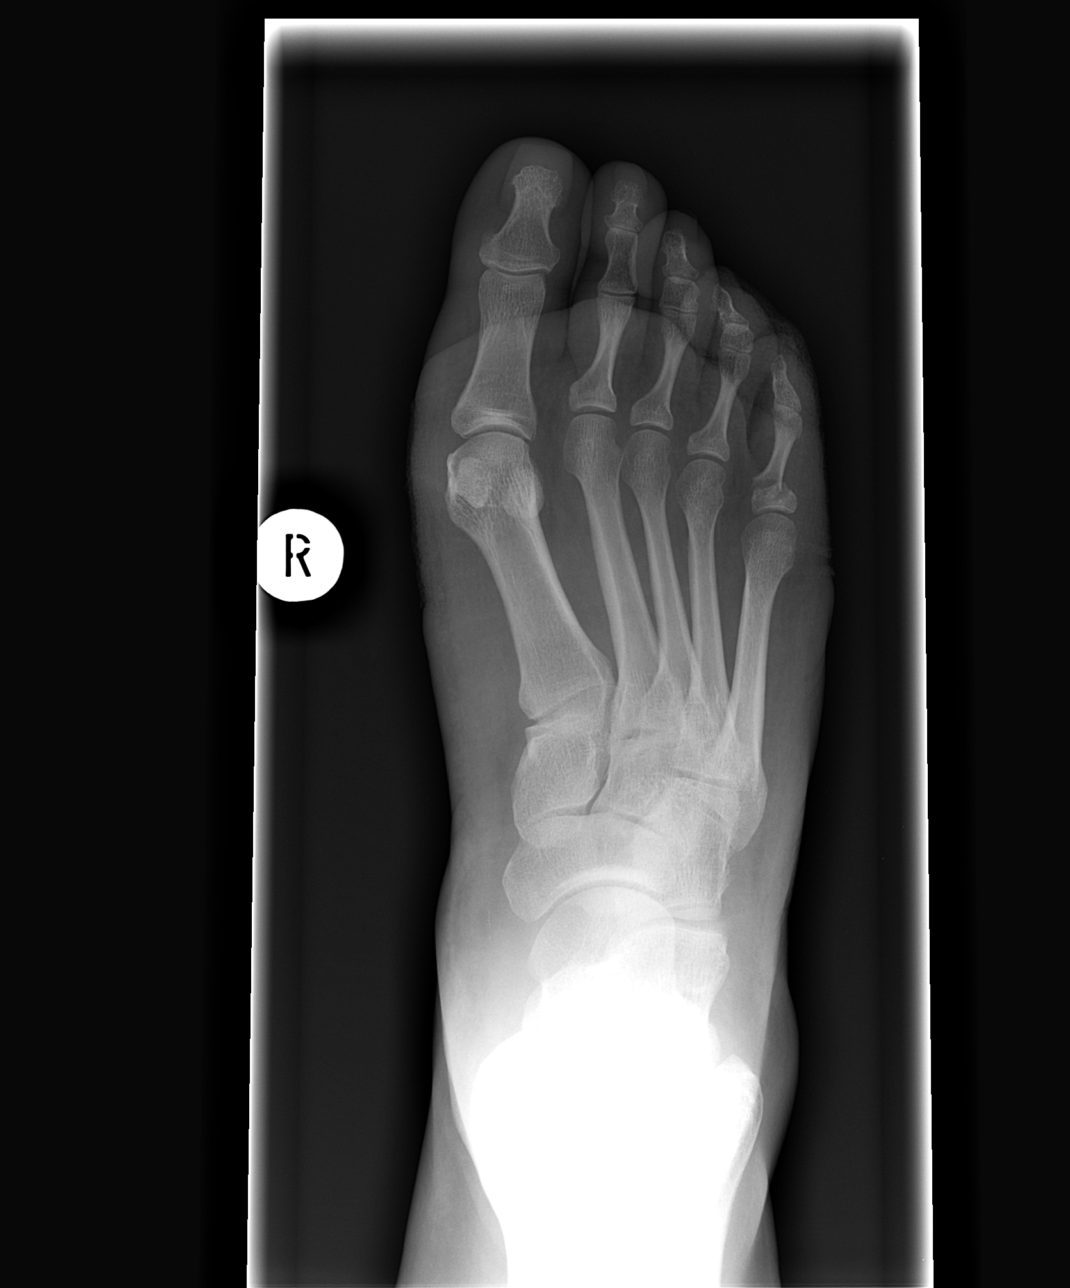

[view not recorded (2 of 3)]
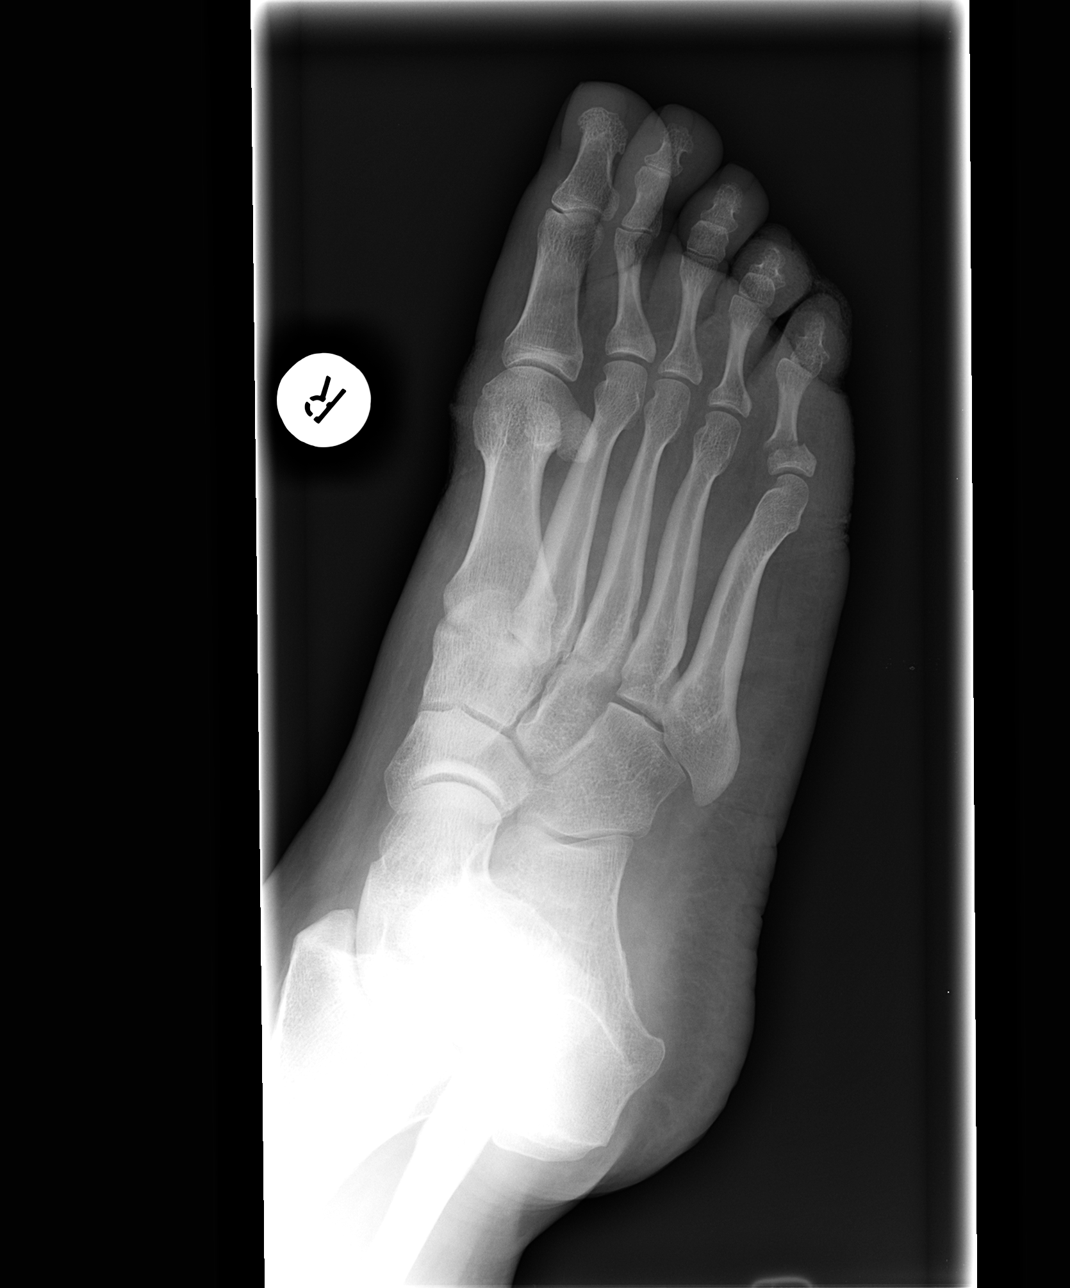

[view not recorded (3 of 3)]
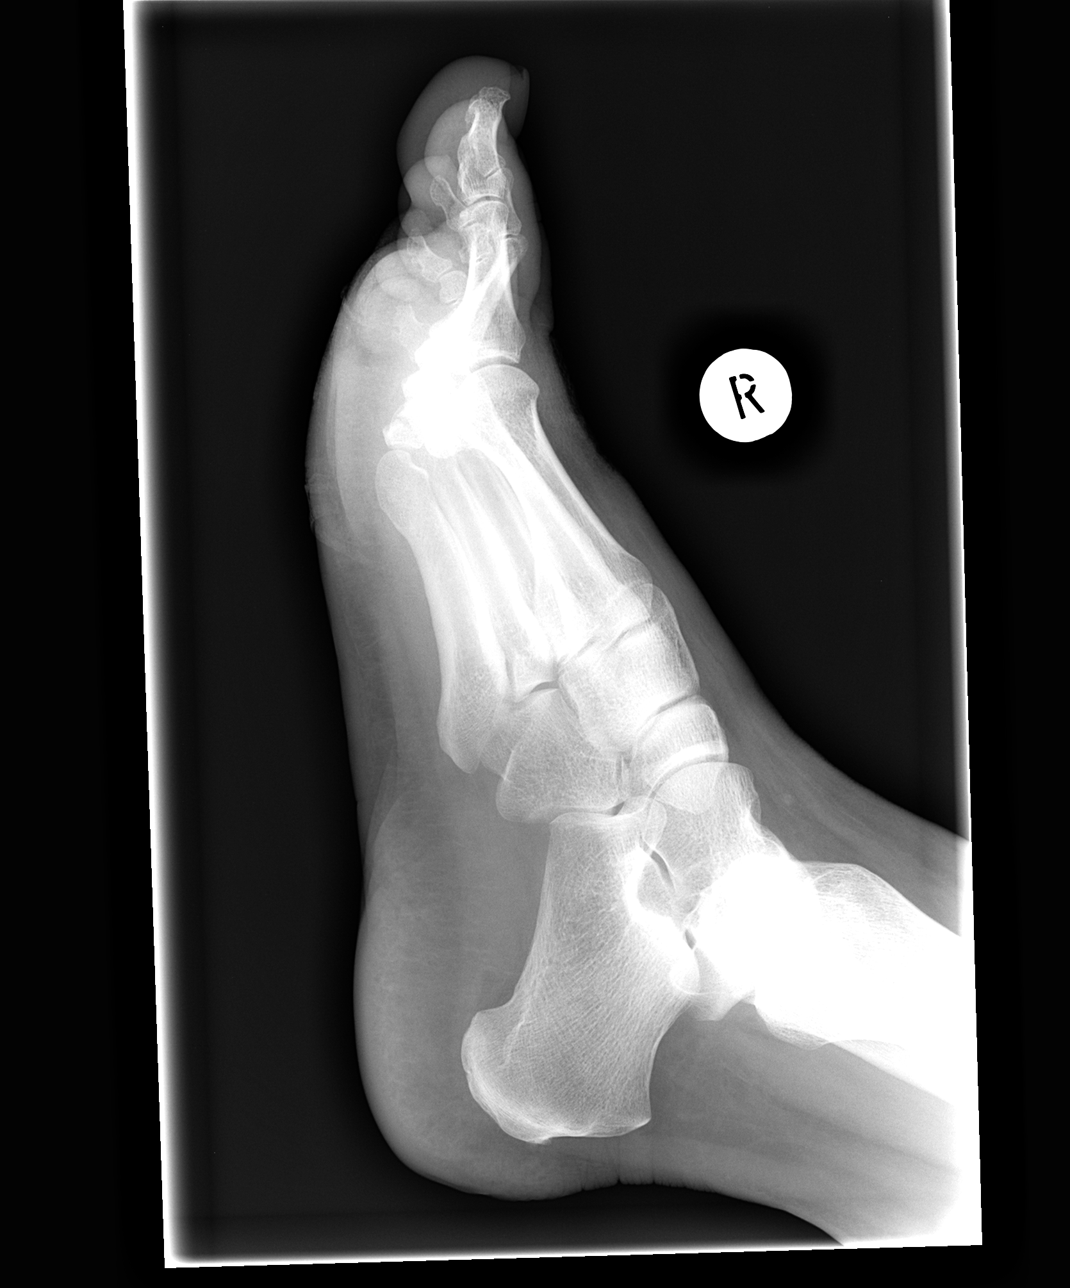

[3 of 3 positions shown; findings below may reference images not displayed]

FINDINGS: The patient has a fracture through the base of the
proximal phalanx of the little toe.  The fracture does not appear
to extend to the articular surface.  There is a mild medial
displacement.  No other acute bony or joint abnormality is
identified.  Soft tissues are unremarkable.
IMPRESSION: Acute fracture through the base of the proximal phalanx of the
little toe as described.

## 2014-01-02 ENCOUNTER — Other Ambulatory Visit: Payer: Self-pay | Admitting: Obstetrics and Gynecology

## 2014-02-25 ENCOUNTER — Encounter (HOSPITAL_COMMUNITY): Payer: Self-pay

## 2014-02-25 ENCOUNTER — Ambulatory Visit (HOSPITAL_COMMUNITY)
Admission: RE | Admit: 2014-02-25 | Discharge: 2014-02-25 | Disposition: A | Discharge status: Home / Self Care | Payer: BC Managed Care – PPO | Source: Ambulatory Visit | Attending: Obstetrics and Gynecology | Admitting: Obstetrics and Gynecology

## 2014-02-25 ENCOUNTER — Other Ambulatory Visit (HOSPITAL_COMMUNITY): Payer: Self-pay

## 2014-02-25 VITALS — BP 124/71 | HR 78 | Wt 181.2 lb

## 2014-02-25 DIAGNOSIS — Z87891 Personal history of nicotine dependence: Secondary | ICD-10-CM | POA: Diagnosis not present

## 2014-02-25 DIAGNOSIS — O09521 Supervision of elderly multigravida, first trimester: Secondary | ICD-10-CM | POA: Insufficient documentation

## 2014-02-25 DIAGNOSIS — O361910 Maternal care for other isoimmunization, first trimester, not applicable or unspecified: Secondary | ICD-10-CM | POA: Diagnosis not present

## 2014-02-25 DIAGNOSIS — Z3A01 Less than 8 weeks gestation of pregnancy: Secondary | ICD-10-CM | POA: Diagnosis not present

## 2014-02-25 NOTE — Addendum Note (Signed)
Encounter addended by: Ty Hilts, RN on: 02/25/2014  2:06 PM<BR>     Documentation filed: Episodes, OB Dating, Flowsheet VN, OB Prenatal Vitals

## 2014-02-25 NOTE — Progress Notes (Signed)
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 Normand Sloop.  The patient is a 33 y.o. 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  She has no previously affected pregnancies.  She did receive 4 units PRBCs after a postpartum hemorrhage in 2013 which is the likely source of her antibodies.  She denies any bleeding or cramping, + nausea.  Review of Systems Pertinent items are noted in HPI.  Patient History OB History  Gravida Para Term Preterm AB SAB TAB Ectopic Multiple Living  3 2 2  1  1   2     # Outcome Date GA Lbr Len/2nd Weight Sex Delivery Anes PTL Lv  3 Term 11/19/11 [redacted]w[redacted]d 15:55 / 00:14 10 lb 2.3 oz (4.601 kg) F Vag-Spont Local  Y  2 Term 09/04/09 [redacted]w[redacted]d 09:10 9 lb 10 oz (4.366 kg) F Vag-Spont None  Y  1 TAB 1998    U    N      Past Medical History  Diagnosis Date  . Thyroid disease     Fluxuate between hyper to hypo  . Asthma   . H/O seasonal allergies   . Hx: UTI (urinary tract infection)   . GERD (gastroesophageal reflux disease)   . Increased BMI   . Headache(784.0)     migraines   . Complication of anesthesia     severe vomiting  . H/O varicella   . Fibrocystic breast 2007  . Fatigue   . 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 Irregular bleeding 10/2005  . 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 Yeast vaginitis 07/2006  . Clitoral irritation 07/2006  . Fullness of breast 01/2007    right  . Abnormal Pap smear 2007    CIN-1    Past Surgical History  Procedure Laterality Date  . Cholecystectomy  09/11/91  . Dilation and curettage of  uterus  2011    endometritis  . Tonsillectomy  10/28/97  . Wisdom tooth extraction  2001  . Dilation and curettage of uterus  11/19/2011    Procedure: DILATATION AND CURETTAGE;  Surgeon: Hal Morales, MD;  Location: [facility];  Service: Gynecology;  Laterality: N/A;  dilitation and currettage with repair of intraoperative cervical laceration.    History   Social History  . Marital Status: Married    Spouse Name: N/A    Number of Children: N/A  . Years of Education: N/A   Social History Main Topics  . Smoking status: Former Smoker -- 1.00 packs/day for 10 years    Types: Cigarettes    Quit date: 02/13/2011  . Smokeless tobacco: Never Used  . Alcohol Use: No  . Drug Use: No  . Sexual Activity: Not Currently   Other Topics Concern  . None   Social History Narrative    Family History  Problem Relation Age of Onset  . Heart disease Father   . Alcohol abuse Father   . Cancer Maternal Aunt     Thyroid & breast  . Cancer Paternal Uncle     Esophageal  . Diabetes Maternal Grandmother   . Cancer  Maternal Grandmother     Kidney  . Cancer Paternal Grandmother   . Hypothyroidism Mother    In addition, the patient has no family history of mental retardation, birth defects, or genetic diseases.  Physical Examination Wt 181 lbs  BP 124/71  P 78 General appearance - alert, well appearing, and in no distress  Assessment and Recommendations 1. 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 Lukacs received after her postpartum hemorrhage after the birth of her second child.  PRBCs are not routinely scanned in the [place] for kell status.  I discussed with her that only ~10% of Americans are Kell antigen positive so in all likelihood the babies father is negative. He is the father of all of her children.  I recommend following this algorithm  1.  Test for Kell antigen on the fathers RBCs.  This was performed in our office today.   A. This is  negative and no further evaluation is necessary and the pregnancy can be managed per normal parameters.  B. The fathers red blood cells are positive for the Kell antigen.  If this is the case then   2. Test the father for RBC genotype of kell.    A.  He is homozygous, baby is an obligate carrier and pregnancy should be managed as potentially affected (see #3 below)  B.  He is heterozygous they can choose an amniocentesis to assess fetal antigen status and manage as such or choose to empirically manage pregnancy as potentially affected  3. If pregnancy at risk for isoimmunization  MCA Dopplers need to be performed weekly beginning at [redacted] weeks gestation.  Periumbilical blood sampling and intrauterine transfusion may become necessary if signs of fetal anemia (hydrops/ MCA >1.5 multiples of the median) occur  Subsequent management dependent on ultrasound findings.    I discussed this plan with both Mr. And Mrs Cathey EndowBowen and they state understanding and agreement with the above plan.    50 minutes was spent with Sheliah MendsKatie Rye >50% of which was devoted to face to face counseling on the above.   Molly MaduroSTREET, Dymir Neeson, MD

## 2014-02-28 ENCOUNTER — Telehealth (HOSPITAL_COMMUNITY): Payer: Self-pay | Admitting: *Deleted

## 2014-02-28 NOTE — Telephone Encounter (Signed)
Message left for patient to call back about lab results.

## 2014-03-03 ENCOUNTER — Telehealth (HOSPITAL_COMMUNITY): Payer: Self-pay | Admitting: *Deleted

## 2014-03-03 NOTE — Telephone Encounter (Signed)
Spoke with pt regarding FOB K+ lab results.  Pt to have him call for lab appt for genotyping and phenotyping.  Informed pt that she will begin MCA doppler studies at 18wks.  Pt voices understanding.

## 2014-03-11 ENCOUNTER — Telehealth (HOSPITAL_COMMUNITY): Payer: Self-pay | Admitting: *Deleted

## 2014-03-11 NOTE — Telephone Encounter (Signed)
Called Brittney Harrison with the results of her husbands Kell genotype K/k.  Informed patient that based on this lab work there is a 50% chance that baby could be affected.  [name] has an appointment with her OB on Thursday.  I will fax these results along with the consult note to her OB office to have available at that appointment.  Pt verbalized understanding.

## 2014-04-11 NOTE — L&D Delivery Note (Signed)
Delivery Note  At 10:03 AM a viable and healthy female Brittney Harrison) was delivered via Vaginal, Spontaneous Delivery (Presentation: Left Occiput Anterior).  APGAR: 8, 9; weight 9 lb 7 oz (4281 g).   Delayed cord clamping for 1 minute. Active management of the third stage of labor. Placenta status: Intact, Spontaneous.  Cord: 3 vessels with the following complications: None. No immediate hemorrhage. Pharmacy called. Cytotec 600 mg by mouth given.  Anesthesia: None  Episiotomy: None Lacerations: 2nd degree;Perineal Suture Repair: 3.0 vicryl Est. Blood Loss (mL): 398   Mom to postpartum.   Baby A to Couplet care / Skin to Skin.     Henok Heacock V 10/05/2014, 11:26 AM

## 2014-05-22 ENCOUNTER — Other Ambulatory Visit (HOSPITAL_COMMUNITY): Payer: Self-pay | Admitting: Obstetrics and Gynecology

## 2014-05-22 DIAGNOSIS — R760 Raised antibody titer: Secondary | ICD-10-CM

## 2014-05-29 ENCOUNTER — Other Ambulatory Visit (HOSPITAL_COMMUNITY): Payer: Self-pay | Admitting: Maternal and Fetal Medicine

## 2014-05-29 ENCOUNTER — Other Ambulatory Visit (HOSPITAL_COMMUNITY): Payer: Self-pay

## 2014-05-29 ENCOUNTER — Encounter (HOSPITAL_COMMUNITY): Payer: Self-pay

## 2014-05-29 ENCOUNTER — Ambulatory Visit (HOSPITAL_COMMUNITY)
Admission: RE | Admit: 2014-05-29 | Discharge: 2014-05-29 | Disposition: A | Discharge status: Home / Self Care | Payer: BLUE CROSS/BLUE SHIELD | Source: Ambulatory Visit | Attending: Obstetrics and Gynecology | Admitting: Obstetrics and Gynecology

## 2014-05-29 DIAGNOSIS — O99352 Diseases of the nervous system complicating pregnancy, second trimester: Secondary | ICD-10-CM

## 2014-05-29 DIAGNOSIS — Z3A20 20 weeks gestation of pregnancy: Secondary | ICD-10-CM | POA: Diagnosis not present

## 2014-05-29 DIAGNOSIS — O09529 Supervision of elderly multigravida, unspecified trimester: Secondary | ICD-10-CM | POA: Insufficient documentation

## 2014-05-29 DIAGNOSIS — R760 Raised antibody titer: Secondary | ICD-10-CM

## 2014-05-29 DIAGNOSIS — O09522 Supervision of elderly multigravida, second trimester: Secondary | ICD-10-CM | POA: Insufficient documentation

## 2014-05-29 DIAGNOSIS — G35 Multiple sclerosis: Secondary | ICD-10-CM

## 2014-05-29 DIAGNOSIS — O99350 Diseases of the nervous system complicating pregnancy, unspecified trimester: Secondary | ICD-10-CM | POA: Diagnosis not present

## 2014-05-29 HISTORY — DX: Multiple sclerosis, unspecified: G35.D

## 2014-05-29 HISTORY — DX: Multiple sclerosis: G35

## 2014-06-06 ENCOUNTER — Encounter (HOSPITAL_COMMUNITY): Payer: Self-pay

## 2014-06-06 ENCOUNTER — Ambulatory Visit (HOSPITAL_COMMUNITY)
Admission: RE | Admit: 2014-06-06 | Discharge: 2014-06-06 | Disposition: A | Discharge status: Home / Self Care | Payer: BLUE CROSS/BLUE SHIELD | Source: Ambulatory Visit | Attending: Obstetrics and Gynecology | Admitting: Obstetrics and Gynecology

## 2014-06-06 DIAGNOSIS — O361920 Maternal care for other isoimmunization, second trimester, not applicable or unspecified: Secondary | ICD-10-CM | POA: Diagnosis not present

## 2014-06-06 DIAGNOSIS — O09522 Supervision of elderly multigravida, second trimester: Secondary | ICD-10-CM | POA: Diagnosis not present

## 2014-06-06 DIAGNOSIS — O99352 Diseases of the nervous system complicating pregnancy, second trimester: Secondary | ICD-10-CM | POA: Diagnosis present

## 2014-06-06 DIAGNOSIS — Z3A21 21 weeks gestation of pregnancy: Secondary | ICD-10-CM | POA: Insufficient documentation

## 2014-06-06 DIAGNOSIS — G35 Multiple sclerosis: Secondary | ICD-10-CM | POA: Insufficient documentation

## 2014-06-13 ENCOUNTER — Encounter (HOSPITAL_COMMUNITY): Payer: Self-pay

## 2014-06-13 ENCOUNTER — Ambulatory Visit (HOSPITAL_COMMUNITY)
Admission: RE | Admit: 2014-06-13 | Discharge: 2014-06-13 | Disposition: A | Discharge status: Home / Self Care | Payer: BLUE CROSS/BLUE SHIELD | Source: Ambulatory Visit | Attending: Obstetrics and Gynecology | Admitting: Obstetrics and Gynecology

## 2014-06-13 DIAGNOSIS — O99350 Diseases of the nervous system complicating pregnancy, unspecified trimester: Secondary | ICD-10-CM

## 2014-06-13 DIAGNOSIS — O361920 Maternal care for other isoimmunization, second trimester, not applicable or unspecified: Secondary | ICD-10-CM | POA: Insufficient documentation

## 2014-06-13 DIAGNOSIS — O09529 Supervision of elderly multigravida, unspecified trimester: Secondary | ICD-10-CM | POA: Insufficient documentation

## 2014-06-13 DIAGNOSIS — O09292 Supervision of pregnancy with other poor reproductive or obstetric history, second trimester: Secondary | ICD-10-CM | POA: Insufficient documentation

## 2014-06-13 DIAGNOSIS — Z3A22 22 weeks gestation of pregnancy: Secondary | ICD-10-CM | POA: Insufficient documentation

## 2014-06-13 DIAGNOSIS — O09522 Supervision of elderly multigravida, second trimester: Secondary | ICD-10-CM | POA: Insufficient documentation

## 2014-06-13 DIAGNOSIS — O99352 Diseases of the nervous system complicating pregnancy, second trimester: Secondary | ICD-10-CM

## 2014-06-13 DIAGNOSIS — G35 Multiple sclerosis: Secondary | ICD-10-CM | POA: Insufficient documentation

## 2014-06-20 ENCOUNTER — Ambulatory Visit (HOSPITAL_COMMUNITY)
Admission: RE | Admit: 2014-06-20 | Discharge: 2014-06-20 | Disposition: A | Discharge status: Home / Self Care | Payer: BLUE CROSS/BLUE SHIELD | Source: Ambulatory Visit | Attending: Obstetrics and Gynecology | Admitting: Obstetrics and Gynecology

## 2014-06-20 ENCOUNTER — Encounter (HOSPITAL_COMMUNITY): Payer: Self-pay

## 2014-06-20 DIAGNOSIS — Z3A23 23 weeks gestation of pregnancy: Secondary | ICD-10-CM | POA: Insufficient documentation

## 2014-06-20 DIAGNOSIS — O09522 Supervision of elderly multigravida, second trimester: Secondary | ICD-10-CM | POA: Diagnosis not present

## 2014-06-20 DIAGNOSIS — G35 Multiple sclerosis: Secondary | ICD-10-CM | POA: Insufficient documentation

## 2014-06-20 DIAGNOSIS — O99352 Diseases of the nervous system complicating pregnancy, second trimester: Secondary | ICD-10-CM | POA: Diagnosis not present

## 2014-06-20 DIAGNOSIS — O361920 Maternal care for other isoimmunization, second trimester, not applicable or unspecified: Secondary | ICD-10-CM | POA: Insufficient documentation

## 2014-06-27 ENCOUNTER — Ambulatory Visit (HOSPITAL_COMMUNITY)
Admission: RE | Admit: 2014-06-27 | Discharge: 2014-06-27 | Disposition: A | Discharge status: Home / Self Care | Payer: BLUE CROSS/BLUE SHIELD | Source: Ambulatory Visit | Attending: Obstetrics and Gynecology | Admitting: Obstetrics and Gynecology

## 2014-06-27 ENCOUNTER — Encounter (HOSPITAL_COMMUNITY): Payer: Self-pay

## 2014-06-27 DIAGNOSIS — O09522 Supervision of elderly multigravida, second trimester: Secondary | ICD-10-CM | POA: Diagnosis not present

## 2014-06-27 DIAGNOSIS — Z3A24 24 weeks gestation of pregnancy: Secondary | ICD-10-CM | POA: Diagnosis not present

## 2014-06-27 DIAGNOSIS — G35 Multiple sclerosis: Secondary | ICD-10-CM | POA: Diagnosis not present

## 2014-06-27 DIAGNOSIS — O99352 Diseases of the nervous system complicating pregnancy, second trimester: Secondary | ICD-10-CM | POA: Diagnosis not present

## 2014-06-27 DIAGNOSIS — O361920 Maternal care for other isoimmunization, second trimester, not applicable or unspecified: Secondary | ICD-10-CM | POA: Insufficient documentation

## 2014-06-27 DIAGNOSIS — O361990 Maternal care for other isoimmunization, unspecified trimester, not applicable or unspecified: Secondary | ICD-10-CM | POA: Insufficient documentation

## 2014-07-04 ENCOUNTER — Encounter (HOSPITAL_COMMUNITY): Payer: Self-pay

## 2014-07-04 ENCOUNTER — Ambulatory Visit (HOSPITAL_COMMUNITY)
Admission: RE | Admit: 2014-07-04 | Discharge: 2014-07-04 | Disposition: A | Discharge status: Home / Self Care | Payer: BLUE CROSS/BLUE SHIELD | Source: Ambulatory Visit | Attending: Obstetrics and Gynecology | Admitting: Obstetrics and Gynecology

## 2014-07-04 ENCOUNTER — Other Ambulatory Visit (HOSPITAL_COMMUNITY): Payer: Self-pay | Admitting: Maternal and Fetal Medicine

## 2014-07-04 DIAGNOSIS — O09522 Supervision of elderly multigravida, second trimester: Secondary | ICD-10-CM | POA: Diagnosis not present

## 2014-07-04 DIAGNOSIS — G35 Multiple sclerosis: Secondary | ICD-10-CM

## 2014-07-04 DIAGNOSIS — O99352 Diseases of the nervous system complicating pregnancy, second trimester: Secondary | ICD-10-CM | POA: Insufficient documentation

## 2014-07-11 ENCOUNTER — Encounter (HOSPITAL_COMMUNITY): Payer: Self-pay

## 2014-07-11 ENCOUNTER — Ambulatory Visit (HOSPITAL_COMMUNITY)
Admission: RE | Admit: 2014-07-11 | Discharge: 2014-07-11 | Disposition: A | Discharge status: Home / Self Care | Payer: BLUE CROSS/BLUE SHIELD | Source: Ambulatory Visit | Attending: Obstetrics and Gynecology | Admitting: Obstetrics and Gynecology

## 2014-07-11 DIAGNOSIS — G35 Multiple sclerosis: Secondary | ICD-10-CM | POA: Insufficient documentation

## 2014-07-11 DIAGNOSIS — O09522 Supervision of elderly multigravida, second trimester: Secondary | ICD-10-CM | POA: Insufficient documentation

## 2014-07-11 DIAGNOSIS — O99352 Diseases of the nervous system complicating pregnancy, second trimester: Secondary | ICD-10-CM | POA: Diagnosis not present

## 2014-07-18 ENCOUNTER — Ambulatory Visit (HOSPITAL_COMMUNITY)
Admission: RE | Admit: 2014-07-18 | Discharge: 2014-07-18 | Disposition: A | Discharge status: Home / Self Care | Payer: BLUE CROSS/BLUE SHIELD | Source: Ambulatory Visit | Attending: Obstetrics and Gynecology | Admitting: Obstetrics and Gynecology

## 2014-07-18 DIAGNOSIS — O09522 Supervision of elderly multigravida, second trimester: Secondary | ICD-10-CM | POA: Diagnosis present

## 2014-07-18 DIAGNOSIS — G35 Multiple sclerosis: Secondary | ICD-10-CM | POA: Diagnosis not present

## 2014-07-18 DIAGNOSIS — O99352 Diseases of the nervous system complicating pregnancy, second trimester: Secondary | ICD-10-CM | POA: Insufficient documentation

## 2014-07-25 ENCOUNTER — Ambulatory Visit (HOSPITAL_COMMUNITY)
Admission: RE | Admit: 2014-07-25 | Discharge: 2014-07-25 | Disposition: A | Discharge status: Home / Self Care | Payer: BLUE CROSS/BLUE SHIELD | Source: Ambulatory Visit | Attending: Obstetrics and Gynecology | Admitting: Obstetrics and Gynecology

## 2014-07-25 ENCOUNTER — Encounter (HOSPITAL_COMMUNITY): Payer: Self-pay

## 2014-07-25 DIAGNOSIS — Z36 Encounter for antenatal screening of mother: Secondary | ICD-10-CM | POA: Diagnosis present

## 2014-07-25 DIAGNOSIS — O361930 Maternal care for other isoimmunization, third trimester, not applicable or unspecified: Secondary | ICD-10-CM | POA: Diagnosis not present

## 2014-07-25 DIAGNOSIS — O99353 Diseases of the nervous system complicating pregnancy, third trimester: Secondary | ICD-10-CM | POA: Insufficient documentation

## 2014-07-25 DIAGNOSIS — Z3A28 28 weeks gestation of pregnancy: Secondary | ICD-10-CM | POA: Insufficient documentation

## 2014-07-25 DIAGNOSIS — G35 Multiple sclerosis: Secondary | ICD-10-CM | POA: Diagnosis not present

## 2014-07-25 DIAGNOSIS — O99352 Diseases of the nervous system complicating pregnancy, second trimester: Secondary | ICD-10-CM

## 2014-07-25 DIAGNOSIS — O09522 Supervision of elderly multigravida, second trimester: Secondary | ICD-10-CM

## 2014-08-01 ENCOUNTER — Ambulatory Visit (HOSPITAL_COMMUNITY)
Admission: RE | Admit: 2014-08-01 | Discharge: 2014-08-01 | Disposition: A | Discharge status: Home / Self Care | Payer: BLUE CROSS/BLUE SHIELD | Source: Ambulatory Visit | Attending: Obstetrics and Gynecology | Admitting: Obstetrics and Gynecology

## 2014-08-01 DIAGNOSIS — O09523 Supervision of elderly multigravida, third trimester: Secondary | ICD-10-CM | POA: Insufficient documentation

## 2014-08-01 DIAGNOSIS — O99352 Diseases of the nervous system complicating pregnancy, second trimester: Secondary | ICD-10-CM

## 2014-08-01 DIAGNOSIS — Z3A29 29 weeks gestation of pregnancy: Secondary | ICD-10-CM | POA: Diagnosis not present

## 2014-08-01 DIAGNOSIS — O09522 Supervision of elderly multigravida, second trimester: Secondary | ICD-10-CM

## 2014-08-01 DIAGNOSIS — G35 Multiple sclerosis: Secondary | ICD-10-CM

## 2014-08-01 DIAGNOSIS — O09293 Supervision of pregnancy with other poor reproductive or obstetric history, third trimester: Secondary | ICD-10-CM | POA: Diagnosis not present

## 2014-08-08 ENCOUNTER — Ambulatory Visit (HOSPITAL_COMMUNITY)
Admission: RE | Admit: 2014-08-08 | Discharge: 2014-08-08 | Disposition: A | Discharge status: Home / Self Care | Payer: BLUE CROSS/BLUE SHIELD | Source: Ambulatory Visit | Attending: Obstetrics and Gynecology | Admitting: Obstetrics and Gynecology

## 2014-08-08 DIAGNOSIS — O99352 Diseases of the nervous system complicating pregnancy, second trimester: Secondary | ICD-10-CM

## 2014-08-08 DIAGNOSIS — Z36 Encounter for antenatal screening of mother: Secondary | ICD-10-CM | POA: Diagnosis not present

## 2014-08-08 DIAGNOSIS — O09522 Supervision of elderly multigravida, second trimester: Secondary | ICD-10-CM

## 2014-08-08 DIAGNOSIS — Z3A30 30 weeks gestation of pregnancy: Secondary | ICD-10-CM | POA: Insufficient documentation

## 2014-08-08 DIAGNOSIS — O361910 Maternal care for other isoimmunization, first trimester, not applicable or unspecified: Secondary | ICD-10-CM | POA: Diagnosis not present

## 2014-08-08 DIAGNOSIS — G35 Multiple sclerosis: Secondary | ICD-10-CM

## 2014-08-08 DIAGNOSIS — O09523 Supervision of elderly multigravida, third trimester: Secondary | ICD-10-CM | POA: Insufficient documentation

## 2014-08-15 ENCOUNTER — Encounter (HOSPITAL_COMMUNITY): Payer: Self-pay

## 2014-08-15 ENCOUNTER — Ambulatory Visit (HOSPITAL_COMMUNITY)
Admission: RE | Admit: 2014-08-15 | Discharge: 2014-08-15 | Disposition: A | Discharge status: Home / Self Care | Payer: BLUE CROSS/BLUE SHIELD | Source: Ambulatory Visit | Attending: Obstetrics and Gynecology | Admitting: Obstetrics and Gynecology

## 2014-08-15 DIAGNOSIS — Z3A31 31 weeks gestation of pregnancy: Secondary | ICD-10-CM | POA: Diagnosis not present

## 2014-08-15 DIAGNOSIS — O403XX0 Polyhydramnios, third trimester, not applicable or unspecified: Secondary | ICD-10-CM | POA: Diagnosis present

## 2014-08-15 DIAGNOSIS — G35 Multiple sclerosis: Secondary | ICD-10-CM | POA: Insufficient documentation

## 2014-08-15 DIAGNOSIS — O09523 Supervision of elderly multigravida, third trimester: Secondary | ICD-10-CM | POA: Insufficient documentation

## 2014-08-15 DIAGNOSIS — O99352 Diseases of the nervous system complicating pregnancy, second trimester: Secondary | ICD-10-CM

## 2014-08-15 DIAGNOSIS — O99353 Diseases of the nervous system complicating pregnancy, third trimester: Secondary | ICD-10-CM | POA: Diagnosis not present

## 2014-08-15 DIAGNOSIS — O09522 Supervision of elderly multigravida, second trimester: Secondary | ICD-10-CM

## 2014-08-22 ENCOUNTER — Ambulatory Visit (HOSPITAL_COMMUNITY)
Admission: RE | Admit: 2014-08-22 | Discharge: 2014-08-22 | Disposition: A | Discharge status: Home / Self Care | Payer: BLUE CROSS/BLUE SHIELD | Source: Ambulatory Visit | Attending: Obstetrics and Gynecology | Admitting: Obstetrics and Gynecology

## 2014-08-22 ENCOUNTER — Other Ambulatory Visit (HOSPITAL_COMMUNITY): Payer: Self-pay | Admitting: Maternal and Fetal Medicine

## 2014-08-22 ENCOUNTER — Encounter (HOSPITAL_COMMUNITY): Payer: Self-pay

## 2014-08-22 DIAGNOSIS — O99352 Diseases of the nervous system complicating pregnancy, second trimester: Secondary | ICD-10-CM | POA: Insufficient documentation

## 2014-08-22 DIAGNOSIS — O09522 Supervision of elderly multigravida, second trimester: Secondary | ICD-10-CM

## 2014-08-22 DIAGNOSIS — G35 Multiple sclerosis: Secondary | ICD-10-CM

## 2014-08-22 DIAGNOSIS — Z3A00 Weeks of gestation of pregnancy not specified: Secondary | ICD-10-CM | POA: Diagnosis not present

## 2014-08-29 ENCOUNTER — Ambulatory Visit (HOSPITAL_COMMUNITY): Admission: RE | Admit: 2014-08-29 | Payer: BLUE CROSS/BLUE SHIELD | Source: Ambulatory Visit

## 2014-08-29 ENCOUNTER — Other Ambulatory Visit (HOSPITAL_COMMUNITY): Payer: Self-pay | Admitting: Maternal and Fetal Medicine

## 2014-08-29 ENCOUNTER — Ambulatory Visit (HOSPITAL_COMMUNITY)
Admission: RE | Admit: 2014-08-29 | Discharge: 2014-08-29 | Disposition: A | Discharge status: Home / Self Care | Payer: BLUE CROSS/BLUE SHIELD | Source: Ambulatory Visit | Attending: Obstetrics and Gynecology | Admitting: Obstetrics and Gynecology

## 2014-08-29 ENCOUNTER — Encounter (HOSPITAL_COMMUNITY): Payer: Self-pay

## 2014-08-29 DIAGNOSIS — G35 Multiple sclerosis: Secondary | ICD-10-CM

## 2014-08-29 DIAGNOSIS — Z3A00 Weeks of gestation of pregnancy not specified: Secondary | ICD-10-CM | POA: Insufficient documentation

## 2014-08-29 DIAGNOSIS — O99352 Diseases of the nervous system complicating pregnancy, second trimester: Secondary | ICD-10-CM | POA: Diagnosis not present

## 2014-08-29 DIAGNOSIS — O09522 Supervision of elderly multigravida, second trimester: Secondary | ICD-10-CM

## 2014-09-05 ENCOUNTER — Encounter (HOSPITAL_COMMUNITY): Payer: Self-pay

## 2014-09-05 ENCOUNTER — Other Ambulatory Visit (HOSPITAL_COMMUNITY): Payer: Self-pay | Admitting: Maternal and Fetal Medicine

## 2014-09-05 ENCOUNTER — Ambulatory Visit (HOSPITAL_COMMUNITY)
Admission: RE | Admit: 2014-09-05 | Discharge: 2014-09-05 | Disposition: A | Discharge status: Home / Self Care | Payer: BLUE CROSS/BLUE SHIELD | Source: Ambulatory Visit | Attending: Obstetrics and Gynecology | Admitting: Obstetrics and Gynecology

## 2014-09-05 DIAGNOSIS — O99352 Diseases of the nervous system complicating pregnancy, second trimester: Secondary | ICD-10-CM | POA: Insufficient documentation

## 2014-09-05 DIAGNOSIS — G35 Multiple sclerosis: Secondary | ICD-10-CM | POA: Diagnosis not present

## 2014-09-05 DIAGNOSIS — O09522 Supervision of elderly multigravida, second trimester: Secondary | ICD-10-CM | POA: Diagnosis not present

## 2014-09-12 ENCOUNTER — Ambulatory Visit (HOSPITAL_COMMUNITY): Payer: BLUE CROSS/BLUE SHIELD

## 2014-09-19 ENCOUNTER — Ambulatory Visit (HOSPITAL_COMMUNITY): Payer: BLUE CROSS/BLUE SHIELD

## 2014-09-26 ENCOUNTER — Ambulatory Visit (HOSPITAL_COMMUNITY): Payer: BLUE CROSS/BLUE SHIELD

## 2014-10-03 ENCOUNTER — Ambulatory Visit (HOSPITAL_COMMUNITY): Payer: BLUE CROSS/BLUE SHIELD

## 2014-10-05 ENCOUNTER — Inpatient Hospital Stay (HOSPITAL_COMMUNITY)
Admission: AD | Admit: 2014-10-05 | Discharge: 2014-10-07 | DRG: 775 | Disposition: A | Discharge status: Home / Self Care | Payer: BLUE CROSS/BLUE SHIELD | Source: Ambulatory Visit | Attending: Obstetrics and Gynecology | Admitting: Obstetrics and Gynecology

## 2014-10-05 ENCOUNTER — Encounter (HOSPITAL_COMMUNITY): Payer: Self-pay | Admitting: *Deleted

## 2014-10-05 DIAGNOSIS — O09523 Supervision of elderly multigravida, third trimester: Secondary | ICD-10-CM | POA: Diagnosis present

## 2014-10-05 DIAGNOSIS — Z3A38 38 weeks gestation of pregnancy: Secondary | ICD-10-CM | POA: Diagnosis present

## 2014-10-05 DIAGNOSIS — O9952 Diseases of the respiratory system complicating childbirth: Secondary | ICD-10-CM | POA: Diagnosis present

## 2014-10-05 DIAGNOSIS — O99824 Streptococcus B carrier state complicating childbirth: Secondary | ICD-10-CM | POA: Diagnosis present

## 2014-10-05 DIAGNOSIS — J45909 Unspecified asthma, uncomplicated: Secondary | ICD-10-CM | POA: Diagnosis present

## 2014-10-05 DIAGNOSIS — O403XX0 Polyhydramnios, third trimester, not applicable or unspecified: Secondary | ICD-10-CM | POA: Diagnosis present

## 2014-10-05 LAB — RPR: RPR: NONREACTIVE

## 2014-10-05 LAB — CBC
HEMATOCRIT: 33.7 % — AB (ref 36.0–46.0)
HEMOGLOBIN: 11.4 g/dL — AB (ref 12.0–15.0)
MCH: 28.1 pg (ref 26.0–34.0)
MCHC: 33.8 g/dL (ref 30.0–36.0)
MCV: 83.2 fL (ref 78.0–100.0)
Platelets: 209 10*3/uL (ref 150–400)
RBC: 4.05 MIL/uL (ref 3.87–5.11)
RDW: 13.7 % (ref 11.5–15.5)
WBC: 14.4 10*3/uL — ABNORMAL HIGH (ref 4.0–10.5)

## 2014-10-05 LAB — AMNISURE RUPTURE OF MEMBRANE (ROM) NOT AT ARMC: Amnisure ROM: POSITIVE

## 2014-10-05 LAB — PREPARE RBC (CROSSMATCH)

## 2014-10-05 MED ORDER — OXYCODONE-ACETAMINOPHEN 5-325 MG PO TABS: 2.0000 | ORAL_TABLET | ORAL | Status: DC | PRN

## 2014-10-05 MED ORDER — SENNOSIDES-DOCUSATE SODIUM 8.6-50 MG PO TABS
2.0000 | ORAL_TABLET | ORAL | Status: DC
  Filled 2014-10-05 (×2): qty 2

## 2014-10-05 MED ORDER — WITCH HAZEL-GLYCERIN EX PADS: 1.0000 "application " | MEDICATED_PAD | CUTANEOUS | Status: DC | PRN

## 2014-10-05 MED ORDER — ACETAMINOPHEN 325 MG PO TABS: 650.0000 mg | ORAL_TABLET | ORAL | Status: DC | PRN

## 2014-10-05 MED ORDER — CITRIC ACID-SODIUM CITRATE 334-500 MG/5ML PO SOLN: 30.0000 mL | ORAL | Status: DC | PRN

## 2014-10-05 MED ORDER — LIDOCAINE HCL (PF) 1 % IJ SOLN
30.0000 mL | INTRAMUSCULAR | Status: AC | PRN
  Administered 2014-10-05: 30 mL via SUBCUTANEOUS
  Filled 2014-10-05: qty 30

## 2014-10-05 MED ORDER — SODIUM CHLORIDE 0.9 % IV SOLN
2.0000 g | Freq: Once | INTRAVENOUS | Status: AC
  Administered 2014-10-05: 2 g via INTRAVENOUS
  Filled 2014-10-05: qty 2000

## 2014-10-05 MED ORDER — ONDANSETRON HCL 4 MG PO TABS: 4.0000 mg | ORAL_TABLET | ORAL | Status: DC | PRN

## 2014-10-05 MED ORDER — PRENATAL MULTIVITAMIN CH
1.0000 | ORAL_TABLET | Freq: Every day | ORAL | Status: DC
  Administered 2014-10-05 – 2014-10-07 (×3): 1 via ORAL
  Filled 2014-10-05 (×3): qty 1

## 2014-10-05 MED ORDER — DIBUCAINE 1 % RE OINT
1.0000 | TOPICAL_OINTMENT | RECTAL | Status: DC | PRN
Start: 2014-10-05 — End: 2014-10-07

## 2014-10-05 MED ORDER — DIPHENHYDRAMINE HCL 25 MG PO CAPS: 25.0000 mg | ORAL_CAPSULE | Freq: Four times a day (QID) | ORAL | Status: DC | PRN

## 2014-10-05 MED ORDER — IBUPROFEN 600 MG PO TABS
600.0000 mg | ORAL_TABLET | Freq: Four times a day (QID) | ORAL | Status: DC
Start: 2014-10-05 — End: 2014-10-07
  Administered 2014-10-05 – 2014-10-07 (×9): 600 mg via ORAL
  Filled 2014-10-05 (×9): qty 1

## 2014-10-05 MED ORDER — OXYCODONE-ACETAMINOPHEN 5-325 MG PO TABS: 1.0000 | ORAL_TABLET | ORAL | Status: DC | PRN

## 2014-10-05 MED ORDER — ONDANSETRON HCL 4 MG/2ML IJ SOLN: 4.0000 mg | Freq: Four times a day (QID) | INTRAMUSCULAR | Status: DC | PRN

## 2014-10-05 MED ORDER — LACTATED RINGERS IV SOLN: 500.0000 mL | INTRAVENOUS | Status: DC | PRN

## 2014-10-05 MED ORDER — TETANUS-DIPHTH-ACELL PERTUSSIS 5-2.5-18.5 LF-MCG/0.5 IM SUSP
0.5000 mL | Freq: Once | INTRAMUSCULAR | Status: AC
  Administered 2014-10-07: 0.5 mL via INTRAMUSCULAR
  Filled 2014-10-05: qty 0.5

## 2014-10-05 MED ORDER — MISOPROSTOL 200 MCG PO TABS
600.0000 ug | ORAL_TABLET | Freq: Once | ORAL | Status: AC
  Administered 2014-10-05: 600 ug via ORAL
  Filled 2014-10-05: qty 3

## 2014-10-05 MED ORDER — ZOLPIDEM TARTRATE 5 MG PO TABS: 5.0000 mg | ORAL_TABLET | Freq: Every evening | ORAL | Status: DC | PRN

## 2014-10-05 MED ORDER — ONDANSETRON HCL 4 MG/2ML IJ SOLN: 4.0000 mg | INTRAMUSCULAR | Status: DC | PRN

## 2014-10-05 MED ORDER — LACTATED RINGERS IV SOLN: INTRAVENOUS | Status: DC

## 2014-10-05 MED ORDER — MEDROXYPROGESTERONE ACETATE 150 MG/ML IM SUSP: 150.0000 mg | INTRAMUSCULAR | Status: DC | PRN

## 2014-10-05 MED ORDER — OXYTOCIN BOLUS FROM INFUSION: 500.0000 mL | INTRAVENOUS | Status: DC

## 2014-10-05 MED ORDER — MEASLES, MUMPS & RUBELLA VAC ~~LOC~~ INJ
0.5000 mL | INJECTION | Freq: Once | SUBCUTANEOUS | Status: DC
  Filled 2014-10-05: qty 0.5

## 2014-10-05 MED ORDER — SIMETHICONE 80 MG PO CHEW: 80.0000 mg | CHEWABLE_TABLET | ORAL | Status: DC | PRN

## 2014-10-05 MED ORDER — LANOLIN HYDROUS EX OINT: TOPICAL_OINTMENT | CUTANEOUS | Status: DC | PRN

## 2014-10-05 MED ORDER — MISOPROSTOL 200 MCG PO TABS
ORAL_TABLET | ORAL | Status: AC
  Administered 2014-10-05: 600 ug via ORAL
  Filled 2014-10-05: qty 5

## 2014-10-05 MED ORDER — MISOPROSTOL 200 MCG PO TABS
600.0000 ug | ORAL_TABLET | Freq: Once | ORAL | Status: AC
  Administered 2014-10-05: 600 ug via ORAL

## 2014-10-05 MED ORDER — SODIUM CHLORIDE 0.9 % IV SOLN: Freq: Once | INTRAVENOUS | Status: DC

## 2014-10-05 MED ORDER — BENZOCAINE-MENTHOL 20-0.5 % EX AERO: 1.0000 "application " | INHALATION_SPRAY | CUTANEOUS | Status: DC | PRN

## 2014-10-05 MED ORDER — OXYTOCIN 40 UNITS IN LACTATED RINGERS INFUSION - SIMPLE MED
62.5000 mL/h | INTRAVENOUS | Status: DC
  Administered 2014-10-05: 62.5 mL/h via INTRAVENOUS
  Filled 2014-10-05: qty 1000

## 2014-10-05 MED ORDER — CARBOPROST TROMETHAMINE 250 MCG/ML IM SOLN
INTRAMUSCULAR | Status: AC
  Filled 2014-10-05: qty 1

## 2014-10-05 MED ORDER — METHYLERGONOVINE MALEATE 0.2 MG/ML IJ SOLN
INTRAMUSCULAR | Status: AC
  Filled 2014-10-05: qty 1

## 2014-10-05 NOTE — Progress Notes (Signed)
V Standard CNM notified of pt's VE 7, orders received to admit pt.

## 2014-10-05 NOTE — MAU Note (Signed)
Pt presents to MAU with complaints of rupture of membranes since last night at 7 with irregular contractions. Woke up with her contractions more regular this morning. Denies any vaginal bleeding

## 2014-10-05 NOTE — H&P (Signed)
Admission History and Physical Exam for an Obstetrics Patient  Ms. Brittney Harrison is a 34 y.o. female, [phone], at [redacted]w[redacted]d gestation, who presents for active labor. She has been followed at the [facility] and Gynecology division of [facility].  Her pregnancy has been complicated by multiple sclerosis, polyhydramnios, history of postpartum hemorrhage, history of a large for gestational age infant, high antibody titer, beta strep carrier, advanced maternal age, and asthma. See history below.  The patient had an anti-Kell antibody titer of 1:64. She was evaluated by maternal fetal medicine. The patient declined amniocentesis. Antenatal testing was performed that was always reassuring.   The patient had a normal first trimester screen. She had a normal alpha-fetoprotein screen.  The patient had a positive beta strep culture.   The patient reports that she may have ruptured her membranes at 7:00 PM on 10/04/2014. Her contractions became regular and strong around 2:00 AM on 10/05/2014. She denies bleeding.  OB History    Gravida Para Term Preterm AB TAB SAB Ectopic Multiple Living   0 3      Past Medical History  Diagnosis Date  . Thyroid disease     Fluxuate between hyper to hypo  . Asthma   . H/O seasonal allergies   . Hx: UTI (urinary tract infection)   . GERD (gastroesophageal reflux disease)   . Increased BMI   . Headache(784.0)     migraines   . Complication of anesthesia     severe vomiting  . H/O varicella   . Fibrocystic breast 2007  . Fatigue   . 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 Irregular bleeding 10/2005  . 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 Yeast vaginitis 07/2006  . Clitoral irritation 07/2006  . Fullness of breast 01/2007    right  . Abnormal Pap smear 2007    CIN-1  . Multiple sclerosis     Prescriptions prior to admission  Medication Sig Dispense Refill Last Dose  .  loperamide (IMODIUM) 2 MG capsule Take 2 mg by mouth 4 (four) times daily as needed. diarrhea   Past Month at Unknown time  . Prenatal Vit-Fe Fumarate-FA (PRENATAL MULTIVITAMIN) TABS Take 1 tablet by mouth at bedtime.    Past Week at Unknown time  . albuterol (PROVENTIL HFA;VENTOLIN HFA) 108 (90 BASE) MCG/ACT inhaler Inhale 2 puffs into the lungs every 6 (six) hours as needed. For asthma   prn  . cetirizine (ZYRTEC) 10 MG tablet Take 10 mg by mouth every morning.    prn  . ferrous sulfate 325 (65 FE) MG tablet Take 1 tablet (325 mg total) by mouth 2 (two) times daily with a meal. 60 tablet 1 Not Taking  . ketorolac (TORADOL) 10 MG tablet Take 1 tablet (10 mg total) by mouth every 6 (six) hours as needed for pain. (Patient not taking: Reported on 05/29/2014) 30 tablet 0 Not Taking at Unknown time  . medroxyPROGESTERone (DEPO-PROVERA) 150 MG/ML injection Inject 1 mL (150 mg total) into the muscle once. (Patient not taking: Reported on 05/29/2014) 1 mL 4 Not Taking at Unknown time    Past Surgical History  Procedure Laterality Date  . Cholecystectomy  09/11/91  . Dilation and curettage of uterus  2011    endometritis  . Tonsillectomy  10/28/97  . Wisdom tooth extraction  2001  . Dilation and curettage of uterus  11/19/2011  Procedure: DILATATION AND CURETTAGE;  Surgeon: Hal Morales, MD;  Location: [facility];  Service: Gynecology;  Laterality: N/A;  dilitation and currettage with repair of intraoperative cervical laceration.    Allergies  Allergen Reactions  . Codeine Nausea And Vomiting  . Eye-Sed Ophthalmic [Zinc] Itching    Eye gel   . Other     ALL NARCOTICS  . Percocet [Oxycodone-Acetaminophen] Nausea And Vomiting    Family History: family history includes Alcohol abuse in her father; Cancer in her maternal aunt, maternal grandmother, paternal grandmother, and paternal uncle; Diabetes in her maternal grandmother; Heart disease in her father; Hypothyroidism in her mother.  Social  History:  reports that she quit smoking about 3 years ago. Her smoking use included Cigarettes. She has a 10 pack-year smoking history. She has never used smokeless tobacco. She reports that she does not drink alcohol or use illicit drugs.  Review of systems: Normal pregnancy complaints.  Admission Physical Exam:  Dilation: 10 Effacement (%): 100 Station: +2 Exam by:: Dr.Jene Huq Body mass index is 34.37 kg/(m^2).  Blood pressure 136/70, pulse 71, temperature 97.9 F (36.6 C), resp. rate 18, height 5\' 8"  (1.727 m), weight 226 lb (102.513 kg), last menstrual period 01/09/2014, unknown if currently breastfeeding.  HEENT:                 Within normal limits Chest:                   Clear Heart:                    Regular rate and rhythm Abdomen:             Gravid and nontender Extremities:          Grossly normal Neurologic exam: Grossly normal Pelvic exam:         Cervix: 7 cm upon admission. I checked the patient on labor and delivery on her cervix was 9 cm dilated. Rupture membranes was performed. Copious amounts of clear fluid was noted.  Prenatal labs: ABO, Rh:             --/--/O POS (06/26 0800) HBsAg:                    nonreactive  HIV:                          nonreactive  GBS:                       positive  Antibody:              POS (06/26 0800) Rubella:                  immune  RPR:                      Nonreactive      Prenatal Transfer Tool  Maternal Diabetes: No Genetic Screening: Normal Maternal Ultrasounds/Referrals: Normal Fetal Ultrasounds or other Referrals:  None, Other:  antenatal testing and multiple ultrasounds were normal except for polyhydramnios. No signs of fetal hydrops.  Maternal Substance Abuse:  No. The patient is a cigarette smoker for 3 years ago. She did not smoke during this pregnancy.  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  We are prepared for a postpartum hemorrhage    Melford Tullier V 10/05/2014, 11:15 AM

## 2014-10-05 NOTE — Progress Notes (Addendum)
Brittney Harrison   Subjective: Post Partum Day 1 Vaginal delivery, 2 degree laceration Patient up ad lib, denies syncope or dizziness. Reports consuming regular diet without issues and denies N/V No issues with urination and reports bleeding is appropriate  Feeding:  breast  Objective: Temp:  [98.3 F (36.8 C)-98.9 F (37.2 C)] 98.3 F (36.8 C) (06/27 0630) Pulse Rate:  [67-82] 72 (06/27 0630) Resp:  [16-18] 18 (06/27 0630) BP: (102-147)/(43-84) 102/43 mmHg (06/27 0630)  Physical Exam:  General: alert and cooperative Ext: WNL, no edema. No evidence of DVT seen on physical exam. Breast: Soft filling Lungs: CTAB Heart RRR without murmur  Abdomen:  Soft, fundus firm, lochia scant, + bowel sounds, non distended, non tender Lochia: appropriate Uterine Fundus: firm Laceration: healing well    Recent Labs  10/05/14 0800 10/06/14 0520  HGB 11.4* 9.5*  HCT 33.7* 28.4*    Assessment S/P Vaginal Delivery-Day 1 Stable  Normal Involution Breastfeeding   Plan: Continue current care Plan for discharge tomorrow and Breastfeeding Lactation support   Wylder Macomber, CNM, MSN 10/06/2014, 10:05 AM

## 2014-10-06 LAB — CBC
HCT: 28.4 % — ABNORMAL LOW (ref 36.0–46.0)
HEMOGLOBIN: 9.5 g/dL — AB (ref 12.0–15.0)
MCH: 28.1 pg (ref 26.0–34.0)
MCHC: 33.5 g/dL (ref 30.0–36.0)
MCV: 84 fL (ref 78.0–100.0)
Platelets: 173 10*3/uL (ref 150–400)
RBC: 3.38 MIL/uL — AB (ref 3.87–5.11)
RDW: 13.8 % (ref 11.5–15.5)
WBC: 12.7 10*3/uL — ABNORMAL HIGH (ref 4.0–10.5)

## 2014-10-06 NOTE — Lactation Note (Addendum)
This note was copied from the chart of Brittney Harrison. Lactation Consultation Note  Patient Name: Brittney Shayleigh Merlini Today's Date: 10/06/2014 Reason for consult: Initial assessment   Initial consult at 28 hours; GA 38.3; BW 9 lbs, 7 oz. With 4% weight loss (5 stools since birth).  Mom is a P3 with experience 15-18 months breastfeeding 2 previous children (youngest 36 yo).  Mom Hx of MS, Fibrocystic breast, and AMA.  Mom also has history of oversupply of breastmilk with 2 previous children.   Infant has breastfed x6 (20-90 min) + attempts x2 (0 min); voids-3; stools-5 since birth. Mom reports milk is coming-in on right side, but c/o pain with latching.  Mom awoke infant for latching so LC could help determine cause of pain. Mom was using cradle hold and had shallow latch with bottom lip just behind nipple shaft. LC adjusted mom's arms to cross-cradle, taught asymmetrical latching with sandwiching, and mom was able to independently latch with depth.  Encouraged chin tug to help increase depth and flanging of lips.  Mom reported the latch felt much better.  LC showed mom how to switch arms back to cradle hold after rhythm has been established.  Explained need for chin depth to decrease pain and increase milk transfer.  Infant fed with a consistent deep rhythm, swallows heard. LS-9. Comfort gels given and explained how to use. Encouraged to continue feeding with cues. Mom has 2 pumps at home that she plans to use as needed after discharge.   Lactation brochure given and informed of outpatient services and hospital support group. Encouraged to call for assistance as needed.   PRN lactation consults for future.       Maternal Data Has patient been taught Hand Expression?: Yes (pt reports knowing how to hand express) Does the patient have breastfeeding experience prior to this delivery?: Yes  Feeding Feeding Type: Breast Fed  LATCH Score/Interventions Latch: Grasps breast easily, tongue down, lips  flanged, rhythmical sucking.  Audible Swallowing: Spontaneous and intermittent  Type of Nipple: Everted at rest and after stimulation  Comfort (Breast/Nipple): Filling, red/small blisters or bruises, mild/mod discomfort  Problem noted: Mild/Moderate discomfort Interventions (Mild/moderate discomfort): Comfort gels;Hand expression  Hold (Positioning): No assistance needed to correctly position infant at breast. Intervention(s): Breastfeeding basics reviewed;Position options;Skin to skin;Support Pillows  LATCH Score: 9  Lactation Tools Discussed/Used     Consult Status Consult Status: PRN    Lendon Ka 10/06/2014, 2:46 PM

## 2014-10-07 LAB — TYPE AND SCREEN
ABO/RH(D): O POS
Antibody Screen: POSITIVE
DAT, IGG: NEGATIVE
DONOR AG TYPE: NEGATIVE
Donor AG Type: NEGATIVE
PT AG TYPE: NEGATIVE
Unit division: 0
Unit division: 0

## 2014-10-07 MED ORDER — IBUPROFEN 600 MG PO TABS: 600.0000 mg | ORAL_TABLET | Freq: Four times a day (QID) | ORAL | Status: DC | PRN

## 2014-10-07 NOTE — Discharge Summary (Signed)
  Vaginal Delivery Discharge Summary  Brittney Harrison  DOB:    04-Oct-1980 MRN:    [id number] CSN:    [id number]  Date of admission:                  10/05/14  Date of discharge:                   10/07/14  Procedures this admission:   SVB, repair of 2nd degree perineal  Date of Delivery: 10/05/14  Newborn Data:  Live born female  Birth Weight: 9 lb 7 oz (4281 g) APGAR: 8, 9  Home with mother. Name: Brittney Harrison   History of Present Illness:  Ms. Brittney Harrison is a 34 y.o. female, [phone], who presents at [redacted]w[redacted]d weeks gestation. The patient has been followed at [facility] and Gynecology division of [facility]. She was admitted for onset of labor. Her pregnancy has been complicated by:  Patient Active Problem List   Diagnosis Date Noted  . Kell isoimmunization during pregnancy   . AMA (advanced maternal age) multigravida 35+   . Multiple sclerosis complicating pregnancy   . Advanced maternal age in multigravida   . Vaginal delivery 11/19/2011  . PPH (postpartum hemorrhage) 11/19/2011  . Thyroid disease 07/27/2011  . Asthma 07/27/2011  . GERD (gastroesophageal reflux disease) 07/27/2011  . IBS (irritable bowel syndrome) 07/27/2011  . Migraines 07/27/2011  . Tobacco abuse 07/27/2011     Hospital Course:   Admitting Dx:   IUP at 38 3/7 weeks, anti-Kell antibody GBS Status:  Positive Delivering Clinician:  Dr. Stefano Harrison Lacerations/MLE: 2nd degree perineal Complications: None  Intrapartum Procedures: spontaneous vaginal delivery Postpartum Procedures: none Complications-Operative and Postpartum: 2nd degree perineal laceration  Discharge Diagnoses: Term Pregnancy-delivered  Feeding:  breast  Contraception:  Considering Mirena IUD  Hemoglobin Results:  CBC Latest Ref Rng 10/06/2014 10/05/2014 12/30/2011  WBC 4.0 - 10.5 K/uL 12.7(H) 14.4(H) 8.5  Hemoglobin 12.0 - 15.0 g/dL 8.3(J) 11.4(L) 14.0  Hematocrit 36.0 - 46.0 % 28.4(L) 33.7(L) 41.9   Platelets 150 - 400 K/uL 173 209 235    Discharge Physical Exam:   General: alert Lochia: appropriate Uterine Fundus: firm Incision: healing well DVT Evaluation: No evidence of DVT seen on physical exam. Negative Homan's sign.   Discharge Information:  Activity:           pelvic rest Diet:                routine Medications: Ibuprofen Condition:      stable Instructions:  Routine pp instructions   Discharge to: home  Follow-up Information    Follow up with [facility]. Schedule an appointment as soon as possible for a visit in 5 weeks.   Specialty:  Obstetrics and Gynecology   Why:  Call for any questions or concerns.   Contact information:   [address] [place] [address] [phone]       Brittney Harrison [facility], [place] 10/07/2014 9:37 AM

## 2014-10-07 NOTE — Lactation Note (Signed)
This note was copied from the chart of Brittney Harrison. Lactation Consultation Note Experienced BF mom is engorged. Mom has hx of over milk supply. Set up DEBP and cleaning instructions.  Encouraged breast massage. Applied 2 ICE packs.  Baby had a 9% weight loss, had 7 voids, 7 stools, and 3 emesis. Baby latching well.  Mom has sore everted nipples. Nipples has a layers effect, has a areola tissue ring around base of nipple then everted nipple comes out of center.  Offered NS since nipples are sore. Fitted #20. Latched baby, mom stated it felt much better but baby mad and would BF well, cried. Mom put baby back to nipple w/o NS.  Gave information sheet and #24 in case the base of nipple comes out. Mom isn't sure if she will use the NS. Encouraged to feel breast before and after BF for milk transfer. Can hear baby gulping at breast.  Mom is going to post pump. Discussed option for over milk supply. Mom has been a donor in the past. Discussed BLOCK BF. Mom wearing comfort gels after BF. Encouraged to call for OP LC f/u if decides to wear NS. Patient Name: Brittney Harrison [id number] Date: 10/07/2014 Reason for consult: Follow-up assessment   Maternal Data    Feeding Feeding Type: Breast Fed Length of feed: 15 min (still bf)  LATCH Score/Interventions Latch: Grasps breast easily, tongue down, lips flanged, rhythmical sucking. Intervention(s): Adjust position;Assist with latch;Breast massage;Breast compression  Audible Swallowing: Spontaneous and intermittent Intervention(s): Alternate breast massage;Hand expression;Skin to skin  Type of Nipple: Everted at rest and after stimulation  Comfort (Breast/Nipple): Engorged, cracked, bleeding, large blisters, severe discomfort Problem noted: Engorgment Intervention(s): Ice;Hand expression  Problem noted: Mild/Moderate discomfort Interventions (Mild/moderate discomfort): Comfort gels;Breast shields;Post-pump;Hand expression;Hand massage  Hold  (Positioning): Assistance needed to correctly position infant at breast and maintain latch. Intervention(s): Breastfeeding basics reviewed;Support Pillows;Position options;Skin to skin  LATCH Score: 7  Lactation Tools Discussed/Used Tools: Pump;Nipple Shields;Comfort gels Nipple shield size: 20;24 Pump Review: Setup, frequency, and cleaning;Milk Storage Initiated by:: Peri Jefferson RN Date initiated:: 10/07/14   Consult Status Consult Status: Complete    Elyssa Pendelton G 10/07/2014, 9:54 AM

## 2014-10-07 NOTE — Discharge Instructions (Signed)

## 2014-10-09 ENCOUNTER — Inpatient Hospital Stay (HOSPITAL_COMMUNITY): Admission: RE | Admit: 2014-10-09 | Payer: BLUE CROSS/BLUE SHIELD | Source: Ambulatory Visit

## 2014-10-09 ENCOUNTER — Ambulatory Visit (HOSPITAL_COMMUNITY)
Admission: RE | Admit: 2014-10-09 | Discharge: 2014-10-09 | Disposition: A | Discharge status: Home / Self Care | Payer: BLUE CROSS/BLUE SHIELD | Source: Ambulatory Visit | Attending: Obstetrics and Gynecology | Admitting: Obstetrics and Gynecology

## 2014-10-09 NOTE — Lactation Note (Signed)
Lactation Consult  Mother's reason for visit:  Sore nipples and unresolved engorgement  Visit Type:  Feeding assessment  Appointment Notes:  Engorgement  Consult:  Initial Lactation Consultant:  Kathrin Greathouse  ________________________________________________________________________  Baby's Name: Brittney Harrison Date of Birth: 10/05/2016 Pediatrician: Dr. Nash Dimmer  Gender: female Gestational Age: [redacted]w[redacted]d (At Birth) Birth Weight: 9 lb 7 oz (4281 g) Weight at Discharge: Weight: 8 lb 9.7 oz (3905 g)Date of Discharge: 10/07/2014 [facility] Weights   10/05/14 1003 10/05/14 2318 10/07/14 0037  Weight: 9 lb 7 oz (4281 g) 9 lb 1.5 oz (4125 g) 8 lb 9.7 oz (3905 g)   Last weight taken from location outside of [facility]: 8-6 oz  Location:Pediatrician's office Weight today: 3828 g , 8-7 oz      ________________________________________________________________________  Mother's Name: Brittney Harrison Type of delivery:   Breastfeeding Experience:  Per mom experienced breast feeder of 2 other children , 15 months each , without engorgement issues  1st baby struggled with over production and yeast infection , 2nd over supply , after improved went well, donated extra milk. Maternal Medical Conditions:  Thyroid ( also hx Asthma ) , over production of milk with 2 babies  Maternal Medications:  PNV   ________________________________________________________________________  Breastfeeding History (Post Discharge)  Frequency of breastfeeding: on demand and every 2-3 hours  Duration of feeding:  10 -15 mins , usually breast feeds one breast , attempt 2nd breast and doesn't take it   Supplementing - none   Pumping- Medela Hand pump , and DEBP    Infant Intake and Output Assessment  Voids:  >6  in 24 hrs.  Color:  Clear yellow Stools:  > 2-3  in 24 hrs.  Color:  Yellow  ________________________________________________________________________  Maternal Breast  Assessment  Breast:  Full and Engorged Nipple:  Erect Pain level:  6 Pain interventions:  Expressed breast milk, ice for 15 -20 mins bilaterally , then hand expressing then prepumping  Latching the baby to start on the right breast and pumping the 2nd breast down at the same time . See LC note below for details.  _______________________________________________________________________ Feeding Assessment/Evaluation  Initial feeding assessment:  Infant's oral assessment:  Variance  Positioning:  Football Left breast  LATCH documentation:  Latch:  2 = Grasps breast easily, tongue down, lips flanged, rhythmical sucking.  Audible swallowing:  2 = Spontaneous and intermittent  Type of nipple:  2 = Everted at rest and after stimulation  Comfort (Breast/Nipple):  1 = Filling, red/small blisters or bruises, mild/mod discomfort - full to boarder line engorged, latched after ice for 15 -20 mins , prepumping , and areola compressible for depth.   Hold (Positioning):  1 = Assistance needed to correctly position infant at breast and maintain latch  LATCH score: 8   Attached assessment:  Deep  Lips flanged:  Yes.    Lips untucked:  Yes.    Suck assessment:  Nutritive  Tools:  DEBP set up  Instructed on use and cleaning of tool:  Yes.    Pre-feed weight: 3828 g , 8-7.0 oz  Post-feed weight: 3856 g , 8-8 oz  Amount transferred:  28 ml  Amount supplemented: none   Additional Feeding Assessment -   Infant's oral assessment:  Slight variance - not interfering with latching  Short labial frenulum above gum line ( upper lip stretches well during exam and at the breast  Questionable short posterior frenulum , not interfering with latch and depth at the breast  Positioning:  Football Left breast  LATCH documentation:  Latch:  2 = Grasps breast easily, tongue down, lips flanged, rhythmical sucking.  Audible swallowing:  2 = Spontaneous and intermittent  Type of nipple:  2 = Everted at rest  and after stimulation  Comfort (Breast/Nipple):  1 = Filling, red/small blisters or bruises, mild/mod discomfort  Hold (Positioning):  1 = Assistance needed to correctly position infant at breast and maintain latch  LATCH score:  8   Attached assessment:  Deep  Lips flanged:  Yes.    Lips untucked:  Yes.    Suck assessment:  Nutritive  Tools:  DEBP , used on the other breast breast to relief engorgement  Instructed on use and cleaning of tool:  Yes.    Pre-feed weight:  3846 g , 8-7.6 oz  Post-feed weight: 3890 g , 8-9.2 oz  Amount transferred: 44 ml  Amount supplemented: none    Total amount pumped post feed: and hand expressing = 98 ml .   Total amount transferred: 72 ml  ( excellent for age of the baby ) at the breast and 98 ml pumping . ( issues with engorgement - relief @ consult )  Total supplement given:  None needed   Lactation Impression:  Mom and baby and grandmother present at consult. Consult due to sore nipples and unresolved engorgement  Per mom had tried every thing. ( ice , pumping , latching and not a lot if relief  Mom presents and appears to be exhausted, dark circles around eyes , crying intermittently during questions for assessment. LC turned down lights and had mom ice and close her ices for 20 mins , then [facility] assisted with latch ( see above assessments ) ,  Along with pumping on the opposite  Breast so mom could get some relief from both quicker. Nipples bilaterally appear sore ,  Small positional strips ( intact ), and no breakdown or bleeding noted.  LC worked with mom to obtain depth both breast and to relieve engorgement. At the end of consult breast softer , still a few nodules mom need to work on at home .  LC stressed to mom she needs to be checking on her breast about every 1 1/2 hours due to her hx of over supply and not to allow  Them to get to the point of firm and hard.   Lactation Plan of Care-  Per mom F/U with Dr. Nash Dimmer tomorrow 7/1  weight check  Mom - naps , plenty fluids, especially water , nutritious snacks and meals Sore nipple prevention and tx -  Expressed milk to nipples liberally  Comfort gels after feedings for 6 days , and then discard  Alternate with shells  Olive oil or coconut oil with pumping / decreases friction Make sure before latching the baby areola needs to be compressible like a thin sandwich  Use #27 flange until soreness improves  Engorgement prevention and tx  Full= good sign ( easier to work with full than engorged )  If > then full heading towards tight to firm to hard =Engorgement ( need icing for 15 -20 mins and then steps  Steps for latching  Breast massage ,hand expressing , prepump if needed, Areola needs to be compressible like a thin sandwich before latching  Latch with breast compressions until baby is in a good pattern and then intermittent  Always soften 1st breast well before offering the 2nd breast . ( due to such a large volume  of milk at this point baby may have many feedings  Where the baby is full and doesn't latch 2nd breast , if really full hand express, or release down to comfort> ( prevent engorgement)

## 2014-10-10 ENCOUNTER — Ambulatory Visit (HOSPITAL_COMMUNITY): Payer: BLUE CROSS/BLUE SHIELD

## 2014-10-22 ENCOUNTER — Ambulatory Visit (HOSPITAL_COMMUNITY)
Admission: RE | Admit: 2014-10-22 | Discharge: 2014-10-22 | Disposition: A | Discharge status: Home / Self Care | Payer: BLUE CROSS/BLUE SHIELD | Source: Ambulatory Visit | Attending: Obstetrics and Gynecology | Admitting: Obstetrics and Gynecology

## 2014-10-22 NOTE — Lactation Note (Signed)
Lactation Consult  Mother's reason for visit:  Concerns of painful latch, and sore nipples   Visit Type: feeding assessment Appointment Notes:  Mother is currently being treated for Mastitis and ductal yeast since Sat. Linward Natal is treating with Diflucan x 10 day  and Keflex qid x 7 days Consult:  Initial Lactation Consultant:  Michel Bickers  ________________________________________________________________________    ________________________________________________________________________  Mother's Name: Brittney Harrison Type of delivery:  vaginal del Breastfeeding Experience:  15 months with 2 other children Maternal Medical Conditions:  None Maternal Medications:  Prenatal vits. Keflex,  Diflucan, motrin  ________________________________________________________________________  Breastfeeding History (Post Discharge)  Frequency of breastfeeding:  Every 2-21/2 hours Duration of feeding:  30-40 mins Patient does not supplement or pump.  Infant Intake and Output Assessment  Voids:10 n 24 hrs.  Color:  Clear yellow Stools:  10  24 hrs.  Color:  Yellow  ________________________________________________________________________  Maternal Breast Assessment  mins- Breast:  Full Nipple:  Erect and Reddened Pain level:  0 Pain interventions:  Bra, All purpose nipple cream, Expressed breast milk and Sore nipple shells  _______________________________________________________________________ Feeding Assessment/Evaluation: Mother taught assymetrical latch technique. Mother described less to no pain with the latch and the feeding. She also was taught to flange upper lip and lower Jaw. Infant has a callus on the upper lip. Her lower lip is blanched white from rubbing. 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 Infant sustained latch for 20 mins. Mother advised to do good breast compression. Infant transferred 88 ml from first breast and 34 ml from  alternate breast.  Observed nipple pinched when infant released both breast.     Infant's oral assessment:  Variance, Infant has a ULT that extends to bottom of gum ridge and has difficulty flanging. 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   Positioning:  Cross cradle Right breast  LATCH documentation:  Latch:  2 = Grasps breast easily, tongue down, lips flanged, rhythmical sucking.  Audible swallowing:  2 = Spontaneous and intermittent  Type of nipple:  2 = Everted at rest and after stimulation  Comfort (Breast/Nipple):  2 = Soft / non-tender  Hold (Positioning):  1 = Assistance needed to correctly position infant at breast and maintain latch  LATCH score:  8  Attached assessment:  Deep  Lips flanged:  no  Lips untucked:  Yes.    Suck assessment:  Displays both  Tools:  Shells and Comfort gels Instructed on use and cleaning of tool:  No.  Pre-feed weight:  4232 g,9-5.3  Post-feed weight:  4320 g, 9-8.4 Amount transferred 88 ml  Positioning:  Cross cradle Left breast  LATCH documentation:  Latch:  2 = Grasps breast easily, tongue down, lips flanged, rhythmical sucking.  Audible swallowing:  2 = Spontaneous and intermittent  Type of nipple:  2 = Everted at rest and after stimulation  Comfort (Breast/Nipple):  1 = Filling, red/small blisters or bruises, mild/mod discomfort  Hold (Positioning):  1 = Assistance needed to correctly position infant at breast and maintain latch  LATCH score:  8  Attached assessment:  Deep  Lips flanged:  No.  Lips untucked:  Yes.    Suck assessment:  Displays both    Pre-feed weight:  4320 g Post-feed weight: 4354 g Amount transferred: 34 ml     Total amount transferred: 122 ml  Advised mother to continue all treatment for yeast  and mastitis until medication is finished She was advised to follow up with Provider if symptoms not relieved after treatment Infant to continue to breastfeed with all  feeding cues and at least every 3 hours during the day Mother to continue with Nystatin suspension for infant until all her symptoms are gone. Advised mother to work on latch and flange infants lips with each feeding Advised to be mindful of not letting infant self latch with shallow latch Tips for better support.  Advised mother to nap daily with other children She was also given tips to prevent future yeast symptoms Mother was given handout to follow up with Cooper Render at the tongue tie clinic if painful latch continues and observed pinching with feeding.  LC PRN

## 2015-07-07 ENCOUNTER — Encounter: Payer: Self-pay | Admitting: Allergy and Immunology

## 2015-07-07 ENCOUNTER — Ambulatory Visit (INDEPENDENT_AMBULATORY_CARE_PROVIDER_SITE_OTHER): Payer: BLUE CROSS/BLUE SHIELD | Admitting: Allergy and Immunology

## 2015-07-07 VITALS — BP 118/74 | HR 72 | Temp 97.6°F | Resp 16 | Ht 66.93 in | Wt 208.3 lb

## 2015-07-07 DIAGNOSIS — J4531 Mild persistent asthma with (acute) exacerbation: Secondary | ICD-10-CM

## 2015-07-07 MED ORDER — METHYLPREDNISOLONE ACETATE 80 MG/ML IJ SUSP
80.0000 mg | Freq: Once | INTRAMUSCULAR | Status: AC
  Administered 2015-07-07: 80 mg via INTRAMUSCULAR

## 2015-07-07 MED ORDER — MONTELUKAST SODIUM 10 MG PO TABS: 10.0000 mg | ORAL_TABLET | Freq: Every day | ORAL | Status: DC

## 2015-07-07 MED ORDER — BUDESONIDE 180 MCG/ACT IN AEPB: 2.0000 | INHALATION_SPRAY | Freq: Two times a day (BID) | RESPIRATORY_TRACT | Status: DC

## 2015-07-07 NOTE — Patient Instructions (Signed)
  1. Depo-Medrol 80 IM delivered in clinic today  2. Pulmicort 180 - 2 inhalations twice a day  3. Montelukast 10 mg one tablet one time per day  4. ProAir HFA 2 puffs every 4-6 hours if needed  5. Further evaluation and treatment?  6. Return to clinic in 3 weeks or earlier if problem

## 2015-07-07 NOTE — Progress Notes (Signed)
Dear Brittney Harrison,  Thank you for referring Brittney Harrison to the [facility] [place] on 07/07/2015.   Below is a summation of this patient's evaluation and recommendations.  Thank you for your referral. I will keep you informed about this patient's response to treatment.   If you have any questions please to do hestitate to contact me.   Sincerely,  Jessica Priest, MD [facility]   ______________________________________________________________________    NEW PATIENT NOTE  Referring Provider: Mila Palmer, MD Primary Provider: Farris Has, MD Date of office visit: 07/07/2015    Subjective:   Chief Complaint:  Brittney Harrison (DOB: [birth year]) is a 35 y.o. female with a chief complaint of Shortness of Breath  who presents to the clinic on 07/07/2015 with the following problems:  HPI Comments: Shanyiah presents this clinic in evaluation of breathing problems. I saw Morissa in this clinic approximately 5 years ago at which time she appeared to have an issue with asthma and allergic rhinitis. She states that over the course of the past several years she is done very well regarding her asthma and rarely had to use a short acting bronchodilator and did not have any significant exacerbations requiring the administration of systemic steroids. However, about 6 weeks ago she's developed some problems with shortness of breath and chest tightness and just not getting enough air. She has been using her short-acting bronchodilator and she does get an initial response for an hour or so well using this medication. She does not have any associated systemic or constitutional symptoms. She does not have any associated nasal symptoms or GI symptoms. She's not had a change in her environment. She is not pregnant as she has an IUD placed. Her last chest x-ray was about 10 years ago. At this point she is not using any controller agents  for her asthma.   Past Medical History  Diagnosis Date  . Thyroid disease     Fluxuate between hyper to hypo  . Asthma   . H/O seasonal allergies   . Hx: UTI (urinary tract infection)   . GERD (gastroesophageal reflux disease)   . Increased BMI   . Headache(784.0)     migraines   . Complication of anesthesia     severe vomiting  . H/O varicella   . Fibrocystic breast 2007  . Fatigue   . 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 Irregular bleeding 10/2005  . 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 Yeast vaginitis 07/2006  . Clitoral irritation 07/2006  . Fullness of breast 01/2007    right  . Abnormal Pap smear 2007    CIN-1  . Multiple sclerosis [facility])     Past Surgical History  Procedure Laterality Date  . Cholecystectomy  09/11/91  . Dilation and curettage of uterus  2011    endometritis  . Tonsillectomy  10/28/97  . Wisdom tooth extraction  2001  . Dilation and curettage of uterus  11/19/2011    Procedure: DILATATION AND CURETTAGE;  Surgeon: Hal Morales, MD;  Location: [facility];  Service: Gynecology;  Laterality: N/A;  dilitation and currettage with repair of intraoperative cervical laceration.      Medication List           cetirizine 10 MG tablet  Commonly known as:  ZYRTEC  Take 10 mg by mouth daily as needed.  COPAXONE 40 MG/ML Sosy  Generic drug:  Glatiramer Acetate  Inject 1 vial into the skin 3 (three) times a week.     prenatal multivitamin Tabs tablet  Take 1 tablet by mouth at bedtime. Reported on 07/07/2015     PROAIR HFA 108 (90 Base) MCG/ACT inhaler  Generic drug:  albuterol  Inhale 2 puffs into the lungs every 4 (four) hours as needed.        Allergies  Allergen Reactions  . Codeine Nausea And Vomiting  . Eye-Sed Ophthalmic [Zinc] Itching    Eye gel   . Percocet [Oxycodone-Acetaminophen] Nausea And Vomiting    Review of systems negative except as noted in HPI / PMHx or noted  below:  Review of Systems  Constitutional: Negative.   HENT: Negative.   Eyes: Negative.   Respiratory: Negative.   Cardiovascular: Negative.   Gastrointestinal: Negative.   Genitourinary: Negative.   Musculoskeletal: Negative.   Skin: Negative.   Neurological: Negative.   Endo/Heme/Allergies: Negative.   Psychiatric/Behavioral: Negative.     Family History  Problem Relation Age of Onset  . Heart disease Father   . Alcohol abuse Father   . Cancer Maternal Aunt     Thyroid & breast  . Cancer Paternal Uncle     Esophageal  . Diabetes Maternal Grandmother   . Cancer Maternal Grandmother     Kidney  . Cancer Paternal Grandmother   . Hypothyroidism Mother     Social History   Social History  . Marital Status: Married    Spouse Name: N/A  . Number of Children: N/A  . Years of Education: N/A   Occupational History  . Not on file.   Social History Main Topics  . Smoking status: Former Smoker -- 1.00 packs/day for 10 years    Types: Cigarettes    Quit date: 02/13/2011  . Smokeless tobacco: Never Used  . Alcohol Use: No  . Drug Use: No  . Sexual Activity: Not Currently   Other Topics Concern  . Not on file   Social History Narrative    Environmental and Social history  Lives in a house with a dry environment, an area rug in the bedroom, no plastic on the bed or pillow, and 2 cats located inside the household, no smokers located inside the household, and employment as a Occupational hygienist.   Objective:   Filed Vitals:   07/07/15 1447  BP: 118/74  Pulse: 72  Temp: 97.6 F (36.4 C)  Resp: 16   Height: 5' 6.93" (170 cm) Weight: 208 lb 5.4 oz (94.5 kg)  Physical Exam  Constitutional: She is well-developed, well-nourished, and in no distress.  HENT:  Head: Normocephalic. Head is without right periorbital erythema and without left periorbital erythema.  Right Ear: Tympanic membrane, external ear and ear canal normal.  Left Ear: Tympanic membrane, external  ear and ear canal normal.  Nose: Nose normal. No mucosal edema or rhinorrhea.  Mouth/Throat: Oropharynx is clear and moist and mucous membranes are normal. No oropharyngeal exudate.  Eyes: Conjunctivae and lids are normal. Pupils are equal, round, and reactive to light.  Neck: Trachea normal. No tracheal deviation present. No thyromegaly present.  Cardiovascular: Normal rate, regular rhythm, S1 normal, S2 normal and normal heart sounds.   No murmur heard. Pulmonary/Chest: Effort normal. No stridor. No tachypnea. No respiratory distress. She has no wheezes. She has no rales. She exhibits no tenderness.  Abdominal: Soft. She exhibits no distension and no mass. There is no hepatosplenomegaly.  There is no tenderness. There is no rebound and no guarding.  Musculoskeletal: She exhibits no edema or tenderness.  Lymphadenopathy:       Head (right side): No tonsillar adenopathy present.       Head (left side): No tonsillar adenopathy present.    She has no cervical adenopathy.    She has no axillary adenopathy.  Neurological: She is alert. Gait normal.  Skin: No rash noted. She is not diaphoretic. No erythema. No pallor. Nails show no clubbing.  Psychiatric: Mood and affect normal.     Diagnostics:  Spirometry was performed and demonstrated an FEV1 of 3.44 @ 112 % of predicted. Following administration nebulized albuterol her FEV1 did not change significantly   Assessment and Plan:    1. Asthma, not well controlled, mild persistent, with acute exacerbation     1. Depo-Medrol 80 IM delivered in clinic today  2. Pulmicort 180 - 2 inhalations twice a day  3. Montelukast 10 mg one tablet one time per day  4. ProAir HFA 2 puffs every 4-6 hours if needed  5. Further evaluation and treatment?  6. Return to clinic in 3 weeks or earlier if problem  I will assume at this point in time that Taeja will do well with the therapy mentioned above which basically includes some anti-inflammatory  medication for her respiratory tract. Certainly if she continues to have problems in the face of this treatment she'll require further evaluation. I've asked her return to this clinic in 3 weeks or earlier if there is problem.   Jessica Priest, MD [facility] [place]

## 2015-08-04 ENCOUNTER — Ambulatory Visit: Payer: BLUE CROSS/BLUE SHIELD | Admitting: Allergy and Immunology

## 2015-08-11 ENCOUNTER — Ambulatory Visit (INDEPENDENT_AMBULATORY_CARE_PROVIDER_SITE_OTHER): Payer: BLUE CROSS/BLUE SHIELD | Admitting: Allergy and Immunology

## 2015-08-11 ENCOUNTER — Encounter: Payer: Self-pay | Admitting: Allergy and Immunology

## 2015-08-11 VITALS — BP 102/82 | HR 80 | Resp 16

## 2015-08-11 DIAGNOSIS — J4530 Mild persistent asthma, uncomplicated: Secondary | ICD-10-CM | POA: Diagnosis not present

## 2015-08-11 DIAGNOSIS — J309 Allergic rhinitis, unspecified: Secondary | ICD-10-CM | POA: Diagnosis not present

## 2015-08-11 DIAGNOSIS — H1010 Acute atopic conjunctivitis, unspecified eye: Secondary | ICD-10-CM

## 2015-08-11 NOTE — Progress Notes (Signed)
Follow-up Note  Referring Provider: Farris Has, MD Primary Provider: Farris Has, MD Date of Office Visit: 08/11/2015  Subjective:   Brittney Harrison (DOB: [birth year]) is a 35 y.o. female [facility] in re-evaluation of the following:  HPI: Brittney Harrison returns to this clinic in reevaluation of her asthma. She is much improved. Her bronchodilator requirement is now down to one or 2 times per week. She's been consistently using her Pulmicort and montelukast. She is resolved the majority of her shortness of breath and chest tightness and air hunger. Her nose and eyes to been doing well while using her cetirizine.    Medication List           budesonide 180 MCG/ACT inhaler  Commonly known as:  PULMICORT FLEXHALER  Inhale 2 puffs into the lungs 2 (two) times daily.     cetirizine 10 MG tablet  Commonly known as:  ZYRTEC  Take 10 mg by mouth daily as needed.     COPAXONE 40 MG/ML Sosy  Generic drug:  Glatiramer Acetate  Inject 1 vial into the skin 3 (three) times a week.     montelukast 10 MG tablet  Commonly known as:  SINGULAIR  Take 1 tablet (10 mg total) by mouth at bedtime.     prenatal multivitamin Tabs tablet  Take 1 tablet by mouth at bedtime. Reported on 07/07/2015     PROAIR HFA 108 (90 Base) MCG/ACT inhaler  Generic drug:  albuterol  Inhale 2 puffs into the lungs every 4 (four) hours as needed.        Past Medical History  Diagnosis Date  . Thyroid disease     Fluxuate between hyper to hypo  . Asthma   . H/O seasonal allergies   . Hx: UTI (urinary tract infection)   . GERD (gastroesophageal reflux disease)   . Increased BMI   . Headache(784.0)     migraines   . Complication of anesthesia     severe vomiting  . H/O varicella   . Fibrocystic breast 2007  . Fatigue   . 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 Irregular bleeding 10/2005  . Thyromegaly  11/2005  . Yeast vaginitis 07/2006  . Clitoral irritation 07/2006  . Fullness of breast 01/2007    right  . Abnormal Pap smear 2007    CIN-1  . Multiple sclerosis [facility])     Past Surgical History  Procedure Laterality Date  . Cholecystectomy  09/11/91  . Dilation and curettage of uterus  2011    endometritis  . Tonsillectomy  10/28/97  . Wisdom tooth extraction  2001  . Dilation and curettage of uterus  11/19/2011    Procedure: DILATATION AND CURETTAGE;  Surgeon: Hal Morales, MD;  Location: [facility];  Service: Gynecology;  Laterality: N/A;  dilitation and currettage with repair of intraoperative cervical laceration.    Allergies  Allergen Reactions  . Codeine Nausea And Vomiting  . Eye-Sed Ophthalmic [Zinc] Itching    Eye gel   . Percocet [Oxycodone-Acetaminophen] Nausea And Vomiting    Review of systems negative except as noted in HPI / PMHx or noted below:  Review of Systems  Constitutional: Negative.   HENT: Negative.   Eyes: Negative.   Respiratory: Negative.   Cardiovascular: Negative.   Gastrointestinal: Negative.   Genitourinary: Negative.   Musculoskeletal: Negative.   Skin: Negative.   Neurological:  Negative.   Endo/Heme/Allergies: Negative.   Psychiatric/Behavioral: Negative.      Objective:   Filed Vitals:   08/11/15 1127  BP: 102/82  Pulse: 80  Resp: 16          Physical Exam  Constitutional: She is well-developed, well-nourished, and in no distress.  HENT:  Head: Normocephalic.  Right Ear: Tympanic membrane, external ear and ear canal normal.  Left Ear: Tympanic membrane, external ear and ear canal normal.  Nose: Nose normal. No mucosal edema or rhinorrhea.  Mouth/Throat: Uvula is midline, oropharynx is clear and moist and mucous membranes are normal. No oropharyngeal exudate.  Eyes: Conjunctivae are normal.  Neck: Trachea normal. No tracheal tenderness present. No tracheal deviation present. No thyromegaly present.  Cardiovascular: Normal  rate, regular rhythm, S1 normal, S2 normal and normal heart sounds.   No murmur heard. Pulmonary/Chest: Breath sounds normal. No stridor. No respiratory distress. She has no wheezes. She has no rales.  Musculoskeletal: She exhibits no edema.  Lymphadenopathy:       Head (right side): No tonsillar adenopathy present.       Head (left side): No tonsillar adenopathy present.    She has no cervical adenopathy.  Neurological: She is alert. Gait normal.  Skin: No rash noted. She is not diaphoretic. No erythema. Nails show no clubbing.  Psychiatric: Mood and affect normal.    Diagnostics:    Spirometry was performed and demonstrated an FEV1 of 3.42 at 110 % of predicted.  The patient had an Asthma Control Test with the following results: ACT Total Score: 18.    Assessment and Plan:   1. Asthma, well controlled, mild persistent   2. Allergic rhinoconjunctivitis     1. Cetirizine 10 mg one tablet once a day  2. Pulmicort 180 - 2 inhalations twice a day. Increase to 3 inhalations 3 times per day during respiratory tract "flareup"  3. Montelukast 10 mg one tablet one time per day  4. ProAir HFA 2 puffs every 4-6 hours if needed  5. Return to clinic in 12 weeks or earlier if problem  Brittney Harrison has responding quite well to medical therapy and we'll continue to have her use this treatment mentioned above and see her back in this clinic in approximately 12 weeks. I did give her an action plan to initiate should she develop a significant flare of her asthma in the future.  Laurette Schimke, MD [facility]

## 2015-08-11 NOTE — Patient Instructions (Addendum)
  1. Cetirizine 10 mg one tablet once a day  2. Pulmicort 180 - 2 inhalations twice a day. Increase to 3 inhalations 3 times per day during respiratory tract "flareup"  3. Montelukast 10 mg one tablet one time per day  4. ProAir HFA 2 puffs every 4-6 hours if needed  5. Return to clinic in 12 weeks or earlier if problem

## 2015-11-03 ENCOUNTER — Ambulatory Visit: Payer: BLUE CROSS/BLUE SHIELD | Admitting: Allergy and Immunology

## 2016-05-11 IMAGING — US US FETAL BPP W/O NONSTRESS
1 series · 13 of 15 positions shown · non-contrast
Comparison: none

[Series 1: us fetal bpp w/o nonstress · 0.23mm/px · 15 acquisitions, 13 frames shown]
[im 1/15]
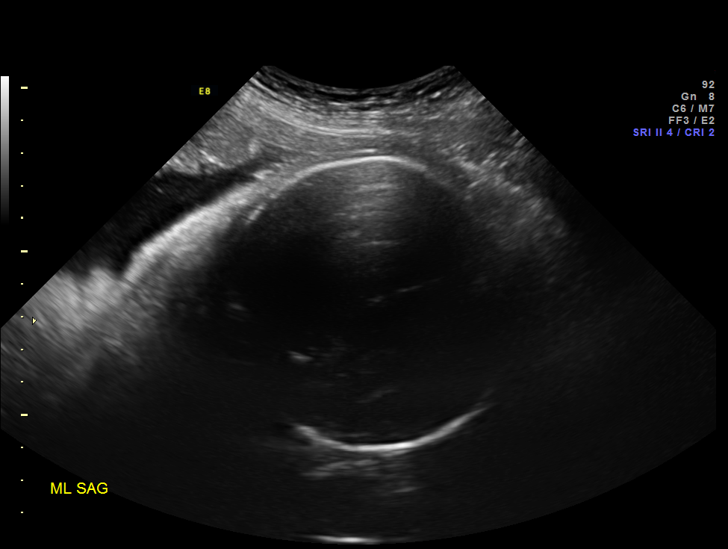
[im 2/15]
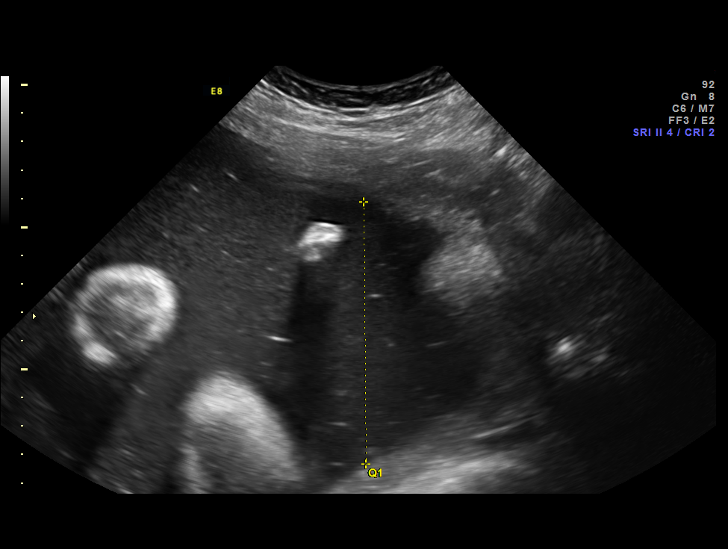
[im 3/15]
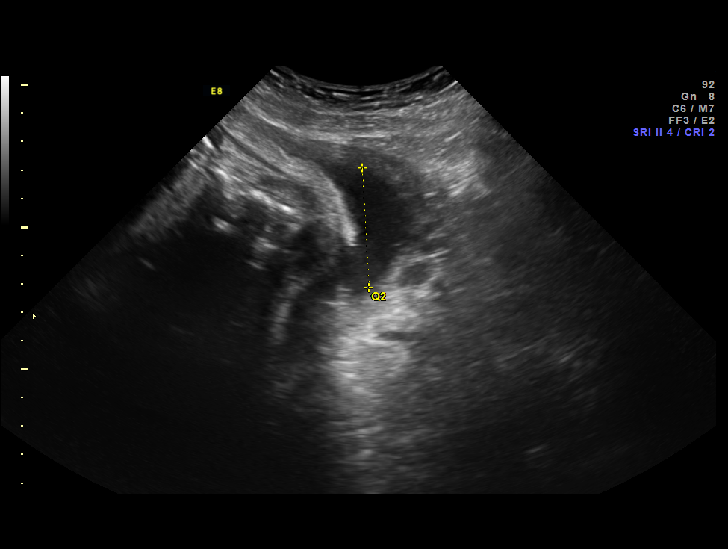
[im 5/15]
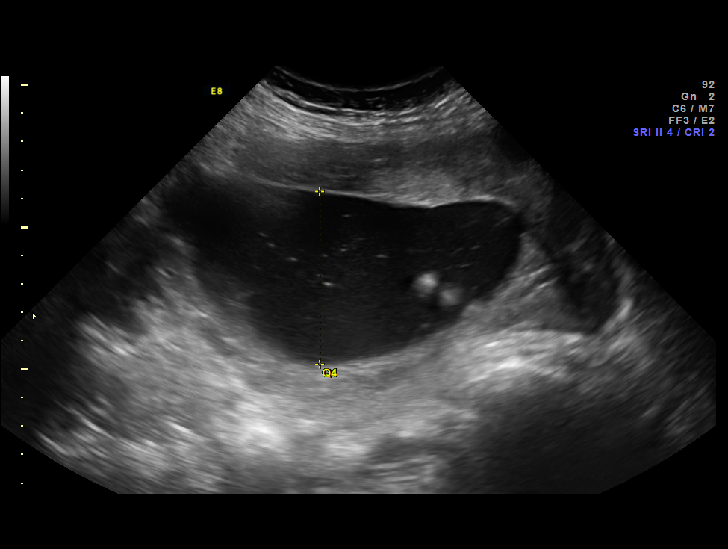
[im 6/15]
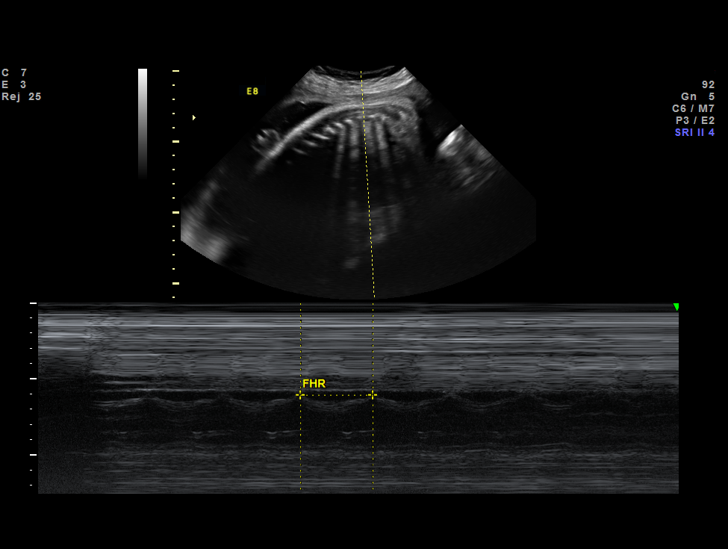
[im 7/15]
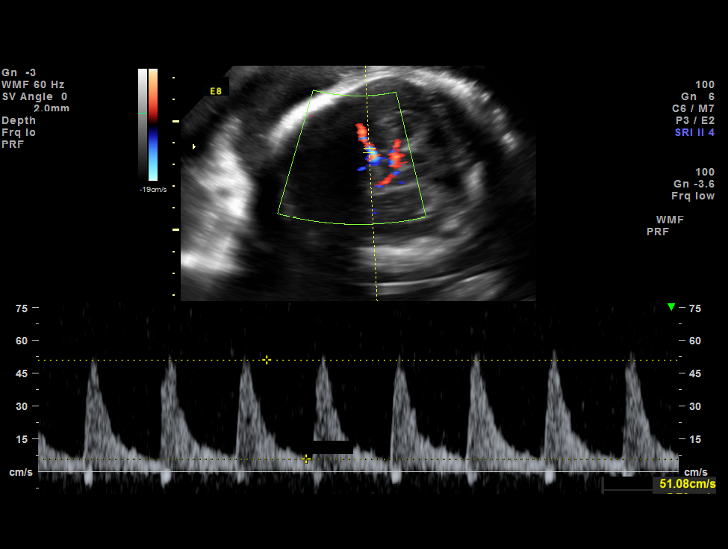
[im 8/15]
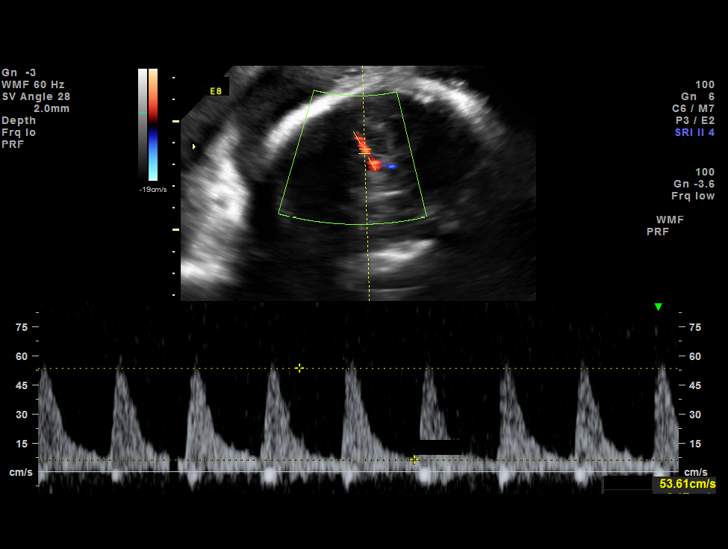
[im 9/15]
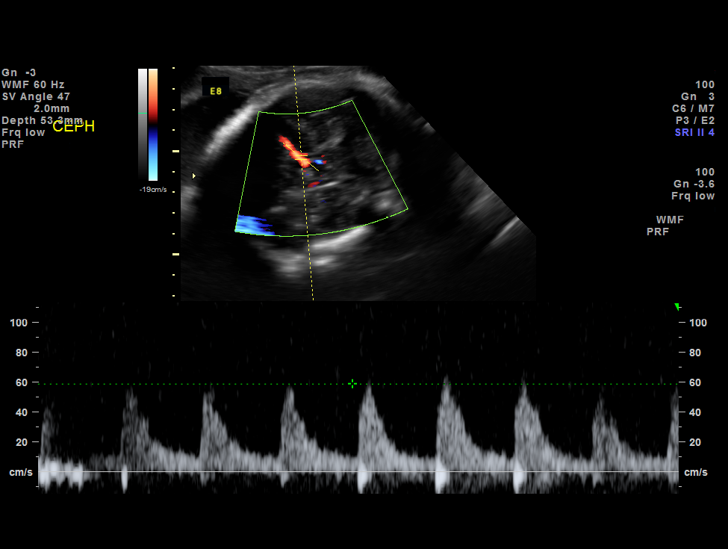
[im 10/15]
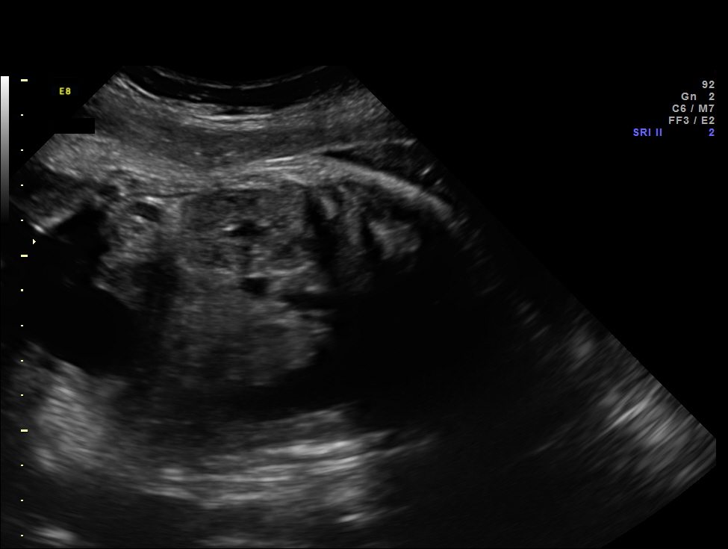
[im 11/15]
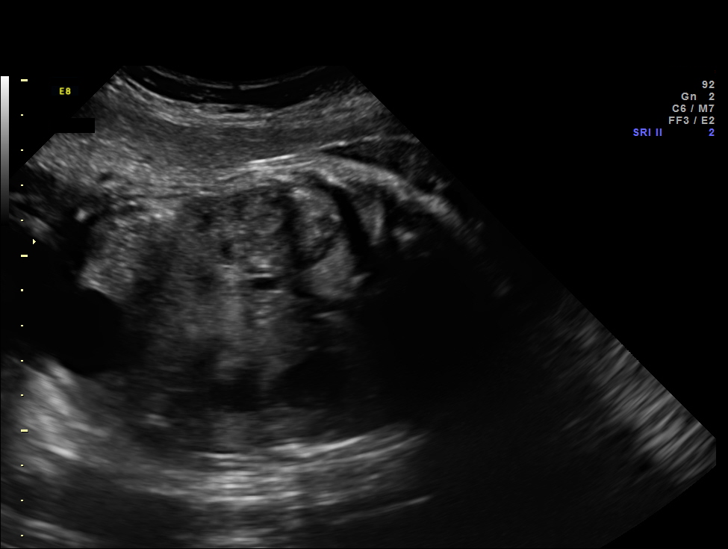
[im 13/15]
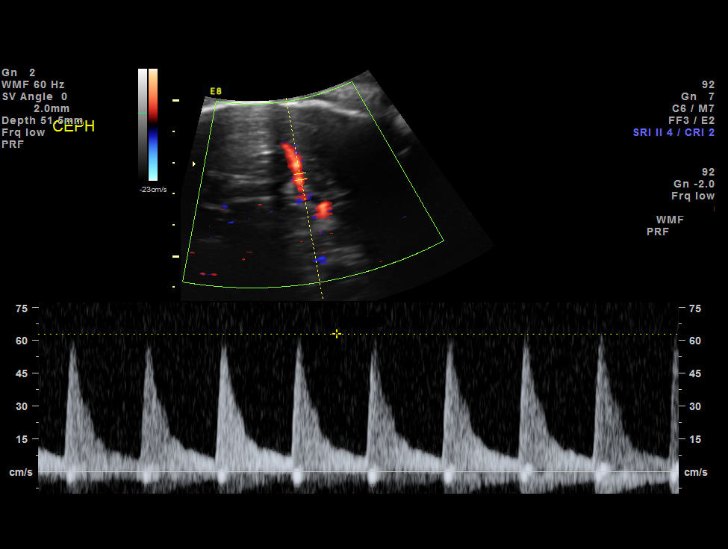
[im 14/15]
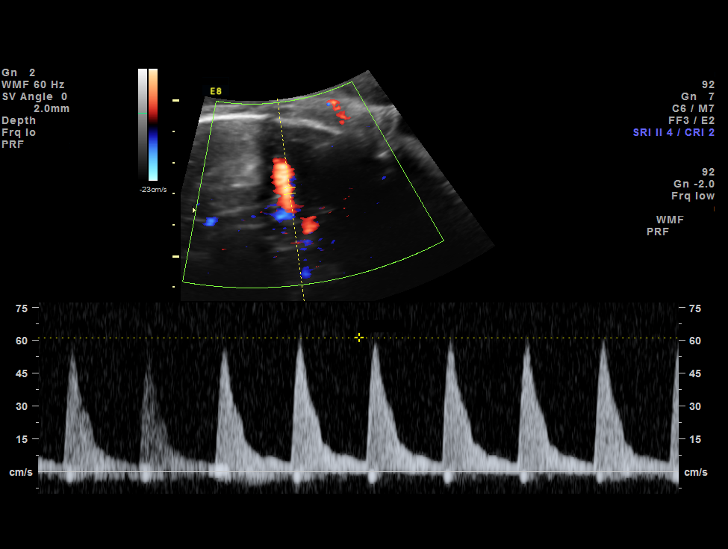
[im 15/15]
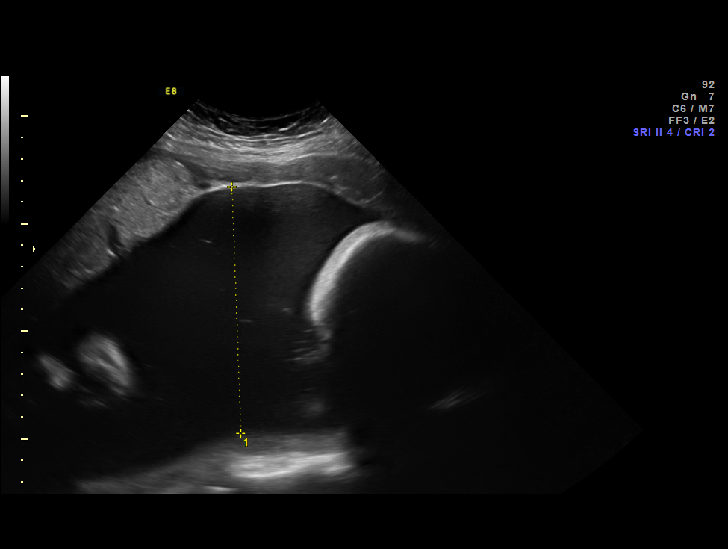

[13 of 15 positions shown; findings below may reference images not displayed]

OBSTETRICS REPORT
(Signed Final 09/05/2014 [DATE])

Service(s) Provided

US MCA DOPPLER RE-EVAL                                76821.2
Indications

Isoimmunization - Pola antibody (FOB
heterozygote for [place])
Multiple sclerosis                                    B11.0CV G35
Poor obstetric history: Previous macrosomia
[id number].[id number]
Polyhydramnios, third trimester, antepartum
condition or complication, unspecified fetus
Advanced maternal age multigravida 35+, third
trimester (low risk 1st trimester screen)
Fetal Evaluation

Num Of Fetuses:    1
Fetal Heart Rate:  139                          bpm
Cardiac Activity:  Observed
Presentation:      Cephalic
Placenta:          Anterior, above cervical os

Amniotic Fluid
AFI FV:      Polyhydramnios
AFI Sum:     28.51   cm     > 97  %Tile     Larg Pckt:    9.22  cm
RUQ:   9.22    cm   RLQ:    6.08   cm    LUQ:   4.22    cm   LLQ:    8.99   cm
Biophysical Evaluation

Amniotic F.V:   Within normal limits       F. Tone:        Observed
F. Movement:    Observed                   Score:          [DATE]
F. Breathing:   Observed
Gestational Age

LMP:           34w 1d        Date:  01/09/14                 EDD:   10/16/14
Best:          34w 1d     Det. By:  LMP  (01/09/14)          EDD:   10/16/14
Doppler - Fetal Vessels

Middle Cerebral Artery
PSV:       61.3    cm/s      1.28  MoM
Cervix Uterus Adnexa

Cervix:       Not visualized (advanced GA >92wks)
Impression

SIUP at 34+1 weeks
Mild polyhydramnios
BPP [DATE]
MCA dopplers: low risk for severe fetal anemia
Recommendations

Continue twice weekly NSTs with weekly AFIs; no further
MCA dopplers necessary
Delivery at 39 weeks
Newborn CBC

questions or concerns.

## 2016-10-13 ENCOUNTER — Encounter: Payer: Self-pay | Admitting: Neurology

## 2016-10-13 ENCOUNTER — Ambulatory Visit (INDEPENDENT_AMBULATORY_CARE_PROVIDER_SITE_OTHER): Payer: Managed Care, Other (non HMO) | Admitting: Neurology

## 2016-10-13 VITALS — BP 136/88 | HR 90 | Ht 67.75 in | Wt 233.0 lb

## 2016-10-13 DIAGNOSIS — R5383 Other fatigue: Secondary | ICD-10-CM | POA: Diagnosis not present

## 2016-10-13 DIAGNOSIS — Z79899 Other long term (current) drug therapy: Secondary | ICD-10-CM

## 2016-10-13 DIAGNOSIS — R3915 Urgency of urination: Secondary | ICD-10-CM | POA: Diagnosis not present

## 2016-10-13 DIAGNOSIS — R269 Unspecified abnormalities of gait and mobility: Secondary | ICD-10-CM

## 2016-10-13 DIAGNOSIS — R200 Anesthesia of skin: Secondary | ICD-10-CM

## 2016-10-13 DIAGNOSIS — G35 Multiple sclerosis: Secondary | ICD-10-CM | POA: Diagnosis not present

## 2016-10-13 NOTE — Progress Notes (Addendum)
[facility]  PATIENT: Brittney Harrison DOB: 28-Aug-1980  REFERRING DOCTOR OR PCP:  Farris Has (PCP); [facility] [phone] Neuro) SOURCE: Patient, notes from Dr. Renne Crigler, imaging and lab results, MRI images on PACS CD  _________________________________   HISTORICAL  CHIEF COMPLAINT:  Chief Complaint  Patient presents with  . Multiple Sclerosis    Brittney Harrison is here to transfer care of MS from Dr. Renne Crigler to Dr. Epimenio Foot.  Sts. she was dx. in 2014.  Presenting sx. was visual disturbance (black spots in vision).  MRI brain and LP done.  She initially started Copaxone, which she stopped it one yr. later when she got pregnanat and was experiencing inj. site fatigue.  She never restarted a dmt--has not been on anything since Oct. 2015.  Would like to discuss tx. options.  Father with hx. of Melanoma/fim    HISTORY OF PRESENT ILLNESS:  I had the pleasure seeing you patient, Brittney Harrison, at [facility] for neurologic consultation regarding her multiple sclerosis.  In May 2002, she had thoracic dysesthesias.   In 2003, while at college, she had severe fatigue and dropped out.    In 2004, she had a episode of right leg numbness x 3 weeks.   It built up over a period of one day.   She had no insurance and improved so did not seek medical opinion.   In 2005, she had more right leg issues and in 2006 had right hand numbness.    Fatigue was also worse.   She also began to note some milder cognitive issues in 2006 that worsened in 2008.  She had severe left leg cramping in 2007 associated with some weakness for a while.  She also noted right facial symptoms in 2008.     In October 2014, she had the onset of black spots in her vision.  An MRI was performed worrisome for MS and an LP showed CSF consistent with MS.   She also had more urinary incontinence around that time.    Initially, she saw Dr. Clarisse Gouge (who also saw her sister with IIH).   He referred her to Dr. Renne Crigler who started her on  Copaxone.   She stopped Copaxone a year later for pregnancy and did not restart after delivery June 2016 due to needle fatigue.    Gait/strength/sensation:  She occasionally feels off balanced but has no recent falls and feels gait is usually fine.    She has a tight sensation in her toes bilaterally, left worse than right.  She denies weakness.   She has had 'zingers' in her neck since 2010 with flexion with symptoms going down the right arm.      Vision:   She notes visual blurring out of the left eye. She still sees black spots in her vision.   Color vision is not affected.  She denies diplopia.    Bladder:  She has had stress incontinence since her first pregnancy.   She has had some spells of incontinence in 2009 before any pregnancy and also had some last year.   Fatigue/sleep:  She is tired daily since 2008, regardless of the duration or quality of her sleep.   Some days seem worse.   Fatigue is worse with heat and as the day goes on.   She tried Ritalin which helped her brian fog more than the fatigue.   She sleeps 6-8 hours nightly depending on her kids.  She has mild sleep onset insomnia but once  asleep she stays asleep.  Cognitive:   She reports a mental fog since 2005.   She notes worsening word finding issues and decreased focus and some forgetfulness.  Mood:    She reports some depression since December that seemed to come out of nowhere.   She feels better this month than a few months ago but not to baseline.    She has had anxiety x years and feels this is stable.   She worries a lot, in general.     FH:   Two distant cousins have MS.     Two family members has had ALS a swell and several have fibromyalgia.  Her 46 yo grandmoither has dementia.    I personally reviewed the MRIs of the brain dated 08/15/2013 and 11/12/2014 and the MRI of the cervical spine 02/21/2013. The MRIs of the brain show a stable pattern of T2/FLAIR hyperintense foci in the periventricular, juxtacortical and  deep white matter consistent with MS. There is also a focus in the right cerebral peduncle. The brain stem appear normal. None of the foci enhanced. The MRI of the cervical spine shows a small posterior focus adjacent to C5-C6.  I also reviewed laboratory and studies from 2014. Visual, brain stem and somatosensory evoked responses were normal. According to a note from Dr. Renne Crigler, CSF was abnormal showing greater than 5 oligoclonal bands.  REVIEW OF SYSTEMS: Constitutional: No fevers, chills, sweats, or change in appetite.  She notes fatigue. Eyes: No visual changes, double vision, eye pain Ear, nose and throat: No hearing loss, ear pain, nasal congestion, sore throat Cardiovascular: No chest pain, palpitations Respiratory: No shortness of breath at rest or with exertion.   No wheezes GastrointestinaI: No nausea, vomiting, diarrhea, abdominal pain, fecal incontinence Genitourinary: No dysuria, urinary retention or frequency.  No nocturia. Musculoskeletal: No neck pain, back pain Integumentary: No rash, pruritus, skin lesions Neurological: as above Psychiatric: No depression at this time.  No anxiety Endocrine: No palpitations, diaphoresis, change in appetite, change in weigh or increased thirst Hematologic/Lymphatic: No anemia, purpura, petechiae. Allergic/Immunologic: No itchy/runny eyes, nasal congestion, recent allergic reactions, rashes  ALLERGIES: Allergies  Allergen Reactions  . Codeine Nausea And Vomiting  . Eye-Sed Ophthalmic [Zinc] Itching    Eye gel   . Percocet [Oxycodone-Acetaminophen] Nausea And Vomiting    HOME MEDICATIONS:  Current Outpatient Prescriptions:  .  albuterol (PROAIR HFA) 108 (90 Base) MCG/ACT inhaler, Inhale 2 puffs into the lungs every 4 (four) hours as needed., Disp: , Rfl:  .  Prenatal Vit-Fe Fumarate-FA (PRENATAL MULTIVITAMIN) TABS, Take 1 tablet by mouth at bedtime. Reported on 07/07/2015, Disp: , Rfl:  .  Glatiramer Acetate (COPAXONE) 40 MG/ML  SOSY, Inject 1 vial into the skin 3 (three) times a week., Disp: , Rfl:   PAST MEDICAL HISTORY: Past Medical History:  Diagnosis Date  . Abnormal Pap smear 2007   CIN-1  . 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 Asthma   . 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 Clitoral irritation 07/2006  . Complication of anesthesia    severe vomiting  . Fatigue   . Fibrocystic breast 2007  . Fullness of breast 01/2007   right  . GERD (gastroesophageal reflux disease)   . H/O seasonal allergies   . H/O varicella   . Headache(784.0)    migraines   . Hx: UTI (urinary tract infection)   . Increased BMI   . Irregular bleeding 10/2005  . Multiple sclerosis (HCC)   .  Thyroid disease    Fluxuate between hyper to hypo  . 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 Vision abnormalities   . Yeast vaginitis 07/2006    PAST SURGICAL HISTORY: Past Surgical History:  Procedure Laterality Date  . CHOLECYSTECTOMY  09/11/91  . DILATION AND CURETTAGE OF UTERUS  2011   endometritis  . DILATION AND CURETTAGE OF UTERUS  11/19/2011   Procedure: DILATATION AND CURETTAGE;  Surgeon: Hal Morales, MD;  Location: [facility];  Service: Gynecology;  Laterality: N/A;  dilitation and currettage with repair of intraoperative cervical laceration.  . TONSILLECTOMY  10/28/97  . WISDOM TOOTH EXTRACTION  2001    FAMILY HISTORY: Family History  Problem Relation Age of Onset  . Heart disease Father   . Alcohol abuse Father   . Melanoma Father   . Cancer Maternal Aunt        Thyroid & breast  . Cancer Paternal Uncle        Esophageal  . Diabetes Maternal Grandmother   . Cancer Maternal Grandmother        Kidney  . Cancer Paternal Grandmother   . Hypothyroidism Mother   . Multiple sclerosis Cousin     SOCIAL HISTORY:  Social History   Social History  . Marital status: Married    Spouse name: N/A  . Number of children: N/A  . Years of education: N/A   Occupational History  . Not on file.   Social History  Main Topics  . Smoking status: Former Smoker    Packs/day: 1.00    Years: 10.00    Types: Cigarettes    Quit date: 02/13/2011  . Smokeless tobacco: Never Used  . Alcohol use No  . Drug use: No  . Sexual activity: Not Currently   Other Topics Concern  . Not on file   Social History Narrative  . No narrative on file     PHYSICAL EXAM  Vitals:   10/13/16 1318  BP: 136/88  Pulse: 90  Weight: 233 lb (105.7 kg)  Height: 5' 7.75" (1.721 m)    Body mass index is 35.69 kg/m.   General: The patient is well-developed and well-nourished and in no acute distress  HEENT:  Head is [place]/AT.  Funduscopic exam shows normal optic discs and retinal vessels.   Pharynx is Mallampati 3.   Neck: The neck is supple, no carotid bruits are noted.  The neck is nontender.  Cardiovascular: The heart has a regular rate and rhythm with a normal S1 and S2. There were no murmurs, gallops or rubs. Lungs are clear to auscultation.  Skin: Extremities are without rash or edema.  Musculoskeletal:  Back is nontender  Neurologic Exam  Mental status: The patient is alert and oriented x 3 at the time of the examination. The patient has apparent normal recent and remote memory, with an apparently normal attention span and concentration ability.   Speech is normal.  Cranial nerves: Extraocular movements are full. Pupils are equal, round, and reactive to light and accomodation.  Visual fields are full.  Facial symmetry is present. There is good facial sensation to soft touch bilaterally.Facial strength is normal.  Trapezius and sternocleidomastoid strength is normal. No dysarthria is noted.  The tongue is midline, and the patient has symmetric elevation of the soft palate. No obvious hearing deficits are noted.  Motor:  Muscle bulk is normal.   Tone is normal. Strength is  5 / 5 in all 4 extremities.   Sensory: Sensory testing is intact to  pinprick, soft touch and vibration sensation in the arms but she has  decreased left vibration sensation.     Coordination: Cerebellar testing reveals good finger-nose-finger and heel-to-shin bilaterally.  Gait and station: Station is normal.   Gait is normal. Tandem gait is wide. Romberg is negative.   Reflexes: Deep tendon reflexes are normal and symmetric in her amrs but the left knee DTR is increased with spread.   Ankles DTRs asymmetric but no clonus.    Plantar responses are flexor.    DIAGNOSTIC DATA (LABS, IMAGING, TESTING) - I reviewed patient records, labs, notes, testing and imaging myself where available.  Lab Results  Component Value Date   WBC 12.7 (H) 10/06/2014   HGB 9.5 (L) 10/06/2014   HCT 28.4 (L) 10/06/2014   MCV 84.0 10/06/2014   PLT 173 10/06/2014        ASSESSMENT AND PLAN   Multiple sclerosis (HCC) - Plan: Hepatitis B core antibody, total, Hepatitis B surface antigen, Quantiferon tb gold assay (blood), CBC with Differential/Platelet, Hepatitis B surface antibody, Hepatic function panel, MR BRAIN W WO CONTRAST, Stratify JCV Antibody Test ([facility])  Gait disturbance - Plan: MR BRAIN W WO CONTRAST  Other fatigue  Numbness - Plan: MR BRAIN W WO CONTRAST  Urinary urgency  High risk medication use - Plan: Hepatitis B core antibody, total, Hepatitis B surface antigen, Quantiferon tb gold assay (blood), CBC with Differential/Platelet, Hepatitis B surface antibody, Hepatic function panel    In summary, Brittney Harrison is a 36 year old woman with multiple sclerosis who has was diagnosed in 2014 and has had some recent fluctuating symptoms but no definite new exacerbation.  We discussed the need to get on a disease modifying therapy. She was on Copaxone for 1 year but had difficulty tolerating it.  She would also have trouble doing injections with one of the interferons. She has had difficulty taking oral medications (missing many of her birth control pills) and would prefer an IV agent. We discussed the oral agents, Tecfidera,  Aubagio and Gilenyal as well as two IV drugs, Tysabri and ocrelizumab. Because of issues with needle fatigue and difficulty taking oral medications,  she is most interested in the IV medications and blood work for hepatitis and tuberculosis and JCV antibody to help [place] decide between these. If she is JCV antibody negative, she would prefer to go on Tysabri. If she is JCV antibody positive, she would prefer to go on ocrelizumab due to the lower risk of PML.   If fatigue worsens, consider a stimulant or Nuvigil.     She will return to see me in 3 or 4 months or sooner if there are new or worsening neurologic symptoms. She will return sooner for her first infusion.   Hayzen Lorenson A. Epimenio Foot, MD, [facility] 10/13/2016, 1:36 PM Certified in Neurology, Clinical Neurophysiology, Sleep Medicine, Pain Medicine and Neuroimaging  [facility] [address], [address] [place], [place] [address] [phone]

## 2016-10-14 LAB — CBC WITH DIFFERENTIAL/PLATELET
BASOS: 0 %
Basophils Absolute: 0 10*3/uL (ref 0.0–0.2)
EOS (ABSOLUTE): 0.3 10*3/uL (ref 0.0–0.4)
EOS: 2 %
Hematocrit: 43.1 % (ref 34.0–46.6)
Hemoglobin: 14.3 g/dL (ref 11.1–15.9)
IMMATURE GRANULOCYTES: 0 %
Immature Grans (Abs): 0 10*3/uL (ref 0.0–0.1)
LYMPHS: 34 %
Lymphocytes Absolute: 4 10*3/uL — ABNORMAL HIGH (ref 0.7–3.1)
MCH: 29.3 pg (ref 26.6–33.0)
MCHC: 33.2 g/dL (ref 31.5–35.7)
MCV: 88 fL (ref 79–97)
Monocytes Absolute: 0.5 10*3/uL (ref 0.1–0.9)
Monocytes: 5 %
NEUTROS PCT: 59 %
Neutrophils Absolute: 6.9 10*3/uL (ref 1.4–7.0)
PLATELETS: 259 10*3/uL (ref 150–379)
RBC: 4.88 x10E6/uL (ref 3.77–5.28)
RDW: 14 % (ref 12.3–15.4)
WBC: 11.8 10*3/uL — ABNORMAL HIGH (ref 3.4–10.8)

## 2016-10-14 LAB — HEPATIC FUNCTION PANEL
ALT: 21 IU/L (ref 0–32)
AST: 21 IU/L (ref 0–40)
Albumin: 4.7 g/dL (ref 3.5–5.5)
Alkaline Phosphatase: 87 IU/L (ref 39–117)
Bilirubin Total: <0.2 mg/dL (ref 0.0–1.2)
Bilirubin, Direct: 0.06 mg/dL (ref 0.00–0.40)
Total Protein: 7.2 g/dL (ref 6.0–8.5)

## 2016-10-14 LAB — HEPATITIS B CORE ANTIBODY, TOTAL: HEP B C TOTAL AB: NEGATIVE

## 2016-10-14 LAB — HEPATITIS B SURFACE ANTIGEN: Hepatitis B Surface Ag: NEGATIVE

## 2016-10-14 LAB — HEPATITIS B SURFACE ANTIBODY,QUALITATIVE: Hep B Surface Ab, Qual: REACTIVE

## 2016-10-16 LAB — QUANTIFERON IN TUBE
QUANTIFERON MITOGEN VALUE: 7.04 IU/mL
QUANTIFERON TB AG VALUE: 0.02 IU/mL
QUANTIFERON TB GOLD: NEGATIVE
Quantiferon Nil Value: 0.04 IU/mL

## 2016-10-16 LAB — QUANTIFERON TB GOLD ASSAY (BLOOD)

## 2016-10-24 ENCOUNTER — Other Ambulatory Visit: Payer: Self-pay | Admitting: *Deleted

## 2016-10-24 DIAGNOSIS — Z79899 Other long term (current) drug therapy: Secondary | ICD-10-CM

## 2016-10-24 DIAGNOSIS — G35 Multiple sclerosis: Secondary | ICD-10-CM

## 2016-10-26 ENCOUNTER — Other Ambulatory Visit (INDEPENDENT_AMBULATORY_CARE_PROVIDER_SITE_OTHER): Payer: Self-pay

## 2016-10-26 DIAGNOSIS — G35 Multiple sclerosis: Secondary | ICD-10-CM

## 2016-10-26 DIAGNOSIS — Z79899 Other long term (current) drug therapy: Secondary | ICD-10-CM

## 2016-10-26 DIAGNOSIS — Z0289 Encounter for other administrative examinations: Secondary | ICD-10-CM

## 2016-10-27 LAB — CBC WITH DIFFERENTIAL/PLATELET
Basophils Absolute: 0 10*3/uL (ref 0.0–0.2)
Basos: 0 %
EOS (ABSOLUTE): 0.4 10*3/uL (ref 0.0–0.4)
Eos: 3 %
HEMATOCRIT: 43.6 % (ref 34.0–46.6)
Hemoglobin: 14.8 g/dL (ref 11.1–15.9)
IMMATURE GRANS (ABS): 0 10*3/uL (ref 0.0–0.1)
Immature Granulocytes: 0 %
LYMPHS ABS: 4.5 10*3/uL — AB (ref 0.7–3.1)
LYMPHS: 40 %
MCH: 30.3 pg (ref 26.6–33.0)
MCHC: 33.9 g/dL (ref 31.5–35.7)
MCV: 89 fL (ref 79–97)
MONOCYTES: 4 %
Monocytes Absolute: 0.5 10*3/uL (ref 0.1–0.9)
NEUTROS ABS: 5.9 10*3/uL (ref 1.4–7.0)
Neutrophils: 53 %
Platelets: 259 10*3/uL (ref 150–379)
RBC: 4.89 x10E6/uL (ref 3.77–5.28)
RDW: 14.6 % (ref 12.3–15.4)
WBC: 11.3 10*3/uL — ABNORMAL HIGH (ref 3.4–10.8)

## 2016-10-31 ENCOUNTER — Other Ambulatory Visit: Payer: Self-pay | Admitting: *Deleted

## 2016-10-31 DIAGNOSIS — G35 Multiple sclerosis: Secondary | ICD-10-CM

## 2016-10-31 DIAGNOSIS — Z79899 Other long term (current) drug therapy: Secondary | ICD-10-CM

## 2016-11-16 ENCOUNTER — Other Ambulatory Visit (INDEPENDENT_AMBULATORY_CARE_PROVIDER_SITE_OTHER): Payer: Self-pay

## 2016-11-16 ENCOUNTER — Telehealth: Payer: Self-pay | Admitting: *Deleted

## 2016-11-16 ENCOUNTER — Encounter (INDEPENDENT_AMBULATORY_CARE_PROVIDER_SITE_OTHER): Payer: Self-pay

## 2016-11-16 DIAGNOSIS — Z0289 Encounter for other administrative examinations: Secondary | ICD-10-CM

## 2016-11-16 DIAGNOSIS — Z79899 Other long term (current) drug therapy: Secondary | ICD-10-CM

## 2016-11-16 DIAGNOSIS — G35 Multiple sclerosis: Secondary | ICD-10-CM

## 2016-11-16 NOTE — Telephone Encounter (Signed)
Called and spoke with Cindi from [facility]. Scheduled routine lab pick up, lock box. Confirmation #: [id number]  

## 2016-11-17 LAB — HEPATIC FUNCTION PANEL
ALBUMIN: 4.8 g/dL (ref 3.5–5.5)
ALK PHOS: 90 IU/L (ref 39–117)
ALT: 23 IU/L (ref 0–32)
AST: 19 IU/L (ref 0–40)
Bilirubin Total: 0.2 mg/dL (ref 0.0–1.2)
Bilirubin, Direct: 0.05 mg/dL (ref 0.00–0.40)
Total Protein: 7.2 g/dL (ref 6.0–8.5)

## 2016-11-21 ENCOUNTER — Telehealth: Payer: Self-pay | Admitting: Neurology

## 2016-11-21 NOTE — Telephone Encounter (Signed)
Alla German from [employer] called back to provide RN Faith the results for pt, he stated value 1.36 if a call back is needed please call [phone] please ref# [id number] V

## 2016-11-21 NOTE — Telephone Encounter (Signed)
Noted.  Per RAS he will rec. Ocrevus.  I have completed srf and placed it in Tina's inbox in the infusion suite.  LMOM for pt. to call and will explain this to her/fim

## 2016-11-23 NOTE — Telephone Encounter (Signed)
I have spoken with Brittney Harrison this am and explained, that b/c she is JCV ab positive (1.36), RAS does not rec. Ty for her--Ocrevus srf has been sent in. She verbalized understanding of same and is agreeable with tx. plan/fim

## 2016-12-07 ENCOUNTER — Other Ambulatory Visit: Payer: BLUE CROSS/BLUE SHIELD

## 2016-12-09 ENCOUNTER — Encounter: Payer: Self-pay | Admitting: Neurology

## 2016-12-09 ENCOUNTER — Ambulatory Visit
Admission: RE | Admit: 2016-12-09 | Discharge: 2016-12-09 | Disposition: A | Discharge status: Home / Self Care | Payer: Managed Care, Other (non HMO) | Source: Ambulatory Visit | Attending: Neurology | Admitting: Neurology

## 2016-12-09 DIAGNOSIS — R200 Anesthesia of skin: Secondary | ICD-10-CM

## 2016-12-09 DIAGNOSIS — R269 Unspecified abnormalities of gait and mobility: Secondary | ICD-10-CM

## 2016-12-09 DIAGNOSIS — G35 Multiple sclerosis: Secondary | ICD-10-CM | POA: Diagnosis not present

## 2016-12-09 MED ORDER — GADOBENATE DIMEGLUMINE 529 MG/ML IV SOLN
20.0000 mL | Freq: Once | INTRAVENOUS | Status: AC | PRN
  Administered 2016-12-09: 20 mL via INTRAVENOUS

## 2016-12-13 ENCOUNTER — Telehealth: Payer: Self-pay | Admitting: *Deleted

## 2016-12-13 NOTE — Telephone Encounter (Signed)
-----   Message from Asa Lente, MD sent at 12/09/2016  1:44 PM EDT ----- Please let her know 1.   The MRI of the brain looks good in that there are no lesions compared to her previous one 2.   I wrote a letter of appeal for ocrelizumab. If were are unable to get this for her, I would like her to try one of the oral agents

## 2016-12-13 NOTE — Telephone Encounter (Signed)
I have spoken with Brittney Harrison this afternoon and per RAS, advised no new lesions on MRI when compared to older MRI.  Also, insurance has denied Ocrevus.  He has appealed it, waiting on a decision.  If it is still denied, he wants her to start one of the pills.  She verbalized understanding of same/fim

## 2017-01-19 ENCOUNTER — Ambulatory Visit: Payer: Managed Care, Other (non HMO) | Admitting: Neurology

## 2017-01-23 ENCOUNTER — Ambulatory Visit (INDEPENDENT_AMBULATORY_CARE_PROVIDER_SITE_OTHER): Payer: Managed Care, Other (non HMO) | Admitting: Neurology

## 2017-01-23 ENCOUNTER — Encounter: Payer: Self-pay | Admitting: Neurology

## 2017-01-23 VITALS — BP 123/85 | HR 84 | Resp 16 | Ht 67.75 in | Wt 233.0 lb

## 2017-01-23 DIAGNOSIS — R200 Anesthesia of skin: Secondary | ICD-10-CM | POA: Diagnosis not present

## 2017-01-23 DIAGNOSIS — G35 Multiple sclerosis: Secondary | ICD-10-CM

## 2017-01-23 DIAGNOSIS — R269 Unspecified abnormalities of gait and mobility: Secondary | ICD-10-CM | POA: Diagnosis not present

## 2017-01-23 DIAGNOSIS — R5383 Other fatigue: Secondary | ICD-10-CM | POA: Diagnosis not present

## 2017-01-23 DIAGNOSIS — H469 Unspecified optic neuritis: Secondary | ICD-10-CM | POA: Diagnosis not present

## 2017-01-23 NOTE — Progress Notes (Signed)
[facility]  PATIENT: Brittney Harrison DOB: 02-14-1981  REFERRING DOCTOR OR PCP:  Farris Has (PCP); [facility] [phone] Neuro) SOURCE: Patient, notes from Dr. Renne Crigler, imaging and lab results, MRI images on PACS CD  _________________________________   HISTORICAL  CHIEF COMPLAINT:  Chief Complaint  Patient presents with  . Multiple Sclerosis    Insurance denied Ocrevus--has been appealed./fim    HISTORY OF PRESENT ILLNESS:  Brittney Harrison Is a 36 year old woman diagnosed with multiple sclerosis in 2014.   Update 01/23/2017:    She has not been on any DMT and her insurance company has not yet approved Ocrevus for her.  She has had more hand numbness and visual changes since the last visit.  Her left eye got painful and she had more black spots while driving to [facility].   She had optic neuritis in the past.    She felt vision was back to normal after a few hours.   She also had right finger numbness for 4 days.      Bladder is doing the same.     The brain MRI 12/09/2016 and was stable compared to her previous one a few years earlier.     She has a lot of trouble with fatigue. She has both physical and mental fatigue.  She gets occasional waves of lassitude, usually in the late afternoons.    She sleeps well.   She has noted more stress but this is better than a few weeks ago.   She has noted more trouble with recall.    She gets some unusual spell of distorted time perception while moving where she feels her brian is moving fast but she is moving slow.   This may last x 5-10 minutes and there will be several spells in the same day.  This occurs once a month.    _______________________________________ From 10/13/2016:  In May 2002, she had thoracic dysesthesias.   In 2003, while at college, she had severe fatigue and droppey d out.    In 2004, she had a episode of right leg numbness x 3 weeks.   It built up over a period of one day.   She had no insurance and improved so did not seek  medical opinion.   In 2005, she had more right leg issues and in 2006 had right hand numbness.    Fatigue was also worse.   She also began to note some milder cognitive issues in 2006 that worsened in 2008.  She had severe left leg cramping in 2007 associated with some weakness for a while.  She also noted right facial symptoms in 2008.     In October 2014, she had the onset of black spots in her vision.  An MRI was performed worrisome for MS and an LP showed CSF consistent with MS.   She also had more urinary incontinence around that time.    Initially, she saw Dr. Clarisse Gouge (who also saw her sister with IIH).   He referred her to Dr. Renne Crigler who started her on Copaxone.   She stopped Copaxone a year later for pregnancy and did not restart after delivery June 2016 due to needle fatigue.    Gait/strength/sensation:  She occasionally feels off balanced but has no recent falls and feels gait is usually fine.    She has a tight sensation in her toes bilaterally, left worse than right.  She denies weakness.   She has had 'zingers' in her neck since 2010  with flexion with symptoms going down the right arm.      Vision:   She notes visual blurring out of the left eye. She still sees black spots in her vision.   Color vision is not affected.  She denies diplopia.    Bladder:  She has had stress incontinence since her first pregnancy.   She has had some spells of incontinence in 2009 before any pregnancy and also had some last year.   Fatigue/sleep:  She is tired daily since 2008, regardless of the duration or quality of her sleep.   Some days seem worse.   Fatigue is worse with heat and as the day goes on.   She tried Ritalin which helped her brian fog more than the fatigue.   She sleeps 6-8 hours nightly depending on her kids.  She has mild sleep onset insomnia but once asleep she stays asleep.  Cognitive:   She reports a mental fog since 2005.   She notes worsening word finding issues and decreased focus and some  forgetfulness.  Mood:    She reports some depression since December that seemed to come out of nowhere.   She feels better this month than a few months ago but not to baseline.    She has had anxiety x years and feels this is stable.   She worries a lot, in general.     FH:   Two distant cousins have MS.     Two family members has had ALS a swell and several have fibromyalgia.  Her 34 yo grandmoither has dementia.    I personally reviewed the MRIs of the brain dated 08/15/2013 and 11/12/2014 and the MRI of the cervical spine 02/21/2013. The MRIs of the brain show a stable pattern of T2/FLAIR hyperintense foci in the periventricular, juxtacortical and deep white matter consistent with MS. There is also a focus in the right cerebral peduncle. The brain stem appear normal. None of the foci enhanced. The MRI of the cervical spine shows a small posterior focus adjacent to C5-C6.  I also reviewed laboratory and studies from 2014. Visual, brain stem and somatosensory evoked responses were normal. According to a note from Dr. Renne Crigler, CSF was abnormal showing greater than 5 oligoclonal bands.  REVIEW OF SYSTEMS: Constitutional: No fevers, chills, sweats, or change in appetite.  She notes fatigue. Eyes: No visual changes, double vision, eye pain Ear, nose and throat: No hearing loss, ear pain, nasal congestion, sore throat Cardiovascular: No chest pain, palpitations Respiratory: No shortness of breath at rest or with exertion.   No wheezes GastrointestinaI: No nausea, vomiting, diarrhea, abdominal pain, fecal incontinence Genitourinary: No dysuria, urinary retention or frequency.  No nocturia. Musculoskeletal: No neck pain, back pain Integumentary: No rash, pruritus, skin lesions Neurological: as above Psychiatric: No depression at this time.  No anxiety Endocrine: No palpitations, diaphoresis, change in appetite, change in weigh or increased thirst Hematologic/Lymphatic: No anemia, purpura,  petechiae. Allergic/Immunologic: No itchy/runny eyes, nasal congestion, recent allergic reactions, rashes  ALLERGIES: Allergies  Allergen Reactions  . Codeine Nausea And Vomiting  . Eye-Sed Ophthalmic [Zinc] Itching    Eye gel   . Percocet [Oxycodone-Acetaminophen] Nausea And Vomiting    HOME MEDICATIONS:  Current Outpatient Prescriptions:  .  albuterol (PROAIR HFA) 108 (90 Base) MCG/ACT inhaler, Inhale 2 puffs into the lungs every 4 (four) hours as needed., Disp: , Rfl:  .  cetirizine (ZYRTEC) 10 MG tablet, Take 10 mg by mouth daily., Disp: , Rfl:  .  Prenatal Vit-Fe Fumarate-FA (PRENATAL MULTIVITAMIN) TABS, Take 1 tablet by mouth at bedtime. Reported on 07/07/2015, Disp: , Rfl:  .  ocrelizumab 600 mg in sodium chloride 0.9 % 500 mL, Inject 600 mg into the vein once., Disp: , Rfl:   PAST MEDICAL HISTORY: Past Medical History:  Diagnosis Date  . Abnormal Pap smear 2007   CIN-1  . 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 Asthma   . 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 Clitoral irritation 07/2006  . Complication of anesthesia    severe vomiting  . Fatigue   . Fibrocystic breast 2007  . Fullness of breast 01/2007   right  . GERD (gastroesophageal reflux disease)   . H/O seasonal allergies   . H/O varicella   . Headache(784.0)    migraines   . Hx: UTI (urinary tract infection)   . Increased BMI   . Irregular bleeding 10/2005  . Multiple sclerosis (HCC)   . Thyroid disease    Fluxuate between hyper to hypo  . 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 Vision abnormalities   . Yeast vaginitis 07/2006    PAST SURGICAL HISTORY: Past Surgical History:  Procedure Laterality Date  . CHOLECYSTECTOMY  09/11/91  . DILATION AND CURETTAGE OF UTERUS  2011   endometritis  . DILATION AND CURETTAGE OF UTERUS  11/19/2011   Procedure: DILATATION AND CURETTAGE;  Surgeon: Hal Morales, MD;  Location: [facility];  Service: Gynecology;  Laterality: N/A;  dilitation and currettage with  repair of intraoperative cervical laceration.  . TONSILLECTOMY  10/28/97  . WISDOM TOOTH EXTRACTION  2001    FAMILY HISTORY: Family History  Problem Relation Age of Onset  . Heart disease Father   . Alcohol abuse Father   . Melanoma Father   . Cancer Maternal Aunt        Thyroid & breast  . Cancer Paternal Uncle        Esophageal  . Diabetes Maternal Grandmother   . Cancer Maternal Grandmother        Kidney  . Cancer Paternal Grandmother   . Hypothyroidism Mother   . Multiple sclerosis Cousin     SOCIAL HISTORY:  Social History   Social History  . Marital status: Married    Spouse name: N/A  . Number of children: N/A  . Years of education: N/A   Occupational History  . Not on file.   Social History Main Topics  . Smoking status: Former Smoker    Packs/day: 1.00    Years: 10.00    Types: Cigarettes    Quit date: 02/13/2011  . Smokeless tobacco: Never Used  . Alcohol use No  . Drug use: No  . Sexual activity: Not Currently   Other Topics Concern  . Not on file   Social History Narrative  . No narrative on file     PHYSICAL EXAM  Vitals:   01/23/17 0959  BP: 123/85  Pulse: 84  Resp: 16  Weight: 233 lb (105.7 kg)  Height: 5' 7.75" (1.721 m)    Body mass index is 35.69 kg/m.   General: The patient is well-developed and well-nourished and in no acute distress  HEENT:  Head is Brittney Harrison.  Funduscopic exam shows normal optic discs and retinal vessels.      Neck: The neck is nontender.   Neurologic Exam  Mental status: The patient is alert and oriented x 3 at the time of the examination. The patient has apparent normal recent  and remote memory, with an apparently normal attention span and concentration ability.   Speech is normal.  Cranial nerves: Extraocular movements are full. Pupils are equal, round, and reactive to light and accomodation.  Visual fields are full.  Mild reduced color vision OS.   Facial symmetry is present. There is good facial  sensation to soft touch bilaterally.Facial strength is normal.  Trapezius and sternocleidomastoid strength is normal. No dysarthria is noted.  The tongue is midline, and the patient has symmetric elevation of the soft palate. No obvious hearing deficits are noted.  Motor:  Muscle bulk is normal.   Tone is normal. Strength is  5 / 5 in all 4 extremities.   Sensory: Sensory testing is intact to pinprick, soft touch and vibration sensation in the arms but she has decreased left vibration sensation.     Coordination: Cerebellar testing reveals good finger-nose-finger and heel-to-shin bilaterally.  Gait and station: Station is normal.   Gait is normal. Tandem gait is wide. Romberg is negative.   Reflexes: Deep tendon reflexes are normal in the arms but increased at knees and ankles, left > right.    No clonus.     DIAGNOSTIC DATA (LABS, IMAGING, TESTING) - I reviewed patient records, labs, notes, testing and imaging myself where available.  Lab Results  Component Value Date   WBC 11.3 (H) 10/26/2016   HGB 14.8 10/26/2016   HCT 43.6 10/26/2016   MCV 89 10/26/2016   PLT 259 10/26/2016        ASSESSMENT AND PLAN   Multiple sclerosis (HCC)  Gait disturbance  Other fatigue  Numbness  Left optic neuritis   1.   Her insurance has not yet approved the Ocrevus. She has been unable to tolerate injections. She is concerned that she may not be compliant with oral medications. We discussed that if her insurance does not approve Ocrevus we will need to get her started on another disease modifying therapy. She cannot go on Tysabri as she is high titer JCV antibody positive.  We discussed the 3 oral medications. 2.    Advised to continue to try to get 7 hours or more sleep every night due to her fatigue.  We will consider Nuvigil or stimulant if this worsens.              3.    Stay active and exercises as tolerated. 4.   She will return to see me in 5 months or sooner if there are new or  worsening neurologic symptoms.  Lerlene Treadwell A. Epimenio Foot, MD, [facility] 01/23/2017, 10:30 AM Certified in Neurology, Clinical Neurophysiology, Sleep Medicine, Pain Medicine and Neuroimaging  [facility] [address] [place], [place] [address] [phone]

## 2017-06-14 ENCOUNTER — Other Ambulatory Visit: Payer: Self-pay

## 2017-06-14 ENCOUNTER — Encounter (HOSPITAL_COMMUNITY): Payer: Self-pay

## 2017-06-14 DIAGNOSIS — Z888 Allergy status to other drugs, medicaments and biological substances status: Secondary | ICD-10-CM

## 2017-06-14 DIAGNOSIS — E876 Hypokalemia: Secondary | ICD-10-CM | POA: Diagnosis present

## 2017-06-14 DIAGNOSIS — D696 Thrombocytopenia, unspecified: Secondary | ICD-10-CM | POA: Diagnosis present

## 2017-06-14 DIAGNOSIS — Z885 Allergy status to narcotic agent status: Secondary | ICD-10-CM

## 2017-06-14 DIAGNOSIS — J189 Pneumonia, unspecified organism: Secondary | ICD-10-CM | POA: Diagnosis present

## 2017-06-14 DIAGNOSIS — A419 Sepsis, unspecified organism: Secondary | ICD-10-CM | POA: Diagnosis not present

## 2017-06-14 DIAGNOSIS — J45909 Unspecified asthma, uncomplicated: Secondary | ICD-10-CM | POA: Diagnosis present

## 2017-06-14 DIAGNOSIS — Z7952 Long term (current) use of systemic steroids: Secondary | ICD-10-CM

## 2017-06-14 DIAGNOSIS — G35 Multiple sclerosis: Secondary | ICD-10-CM | POA: Diagnosis present

## 2017-06-14 DIAGNOSIS — Z79899 Other long term (current) drug therapy: Secondary | ICD-10-CM

## 2017-06-14 LAB — CBC WITH DIFFERENTIAL/PLATELET
Basophils Absolute: 0.1 10*3/uL (ref 0.0–0.1)
Basophils Relative: 1 %
EOS ABS: 0 10*3/uL (ref 0.0–0.7)
EOS PCT: 0 %
HCT: 45.7 % (ref 36.0–46.0)
Hemoglobin: 15.9 g/dL — ABNORMAL HIGH (ref 12.0–15.0)
LYMPHS ABS: 2.4 10*3/uL (ref 0.7–4.0)
Lymphocytes Relative: 51 %
MCH: 31 pg (ref 26.0–34.0)
MCHC: 34.8 g/dL (ref 30.0–36.0)
MCV: 89.1 fL (ref 78.0–100.0)
MONO ABS: 0.2 10*3/uL (ref 0.1–1.0)
Monocytes Relative: 3 %
NEUTROS PCT: 45 %
Neutro Abs: 2.3 10*3/uL (ref 1.7–7.7)
PLATELETS: 93 10*3/uL — AB (ref 150–400)
RBC: 5.13 MIL/uL — AB (ref 3.87–5.11)
RDW: 14.8 % (ref 11.5–15.5)
WBC: 5 10*3/uL (ref 4.0–10.5)

## 2017-06-14 LAB — URINALYSIS, ROUTINE W REFLEX MICROSCOPIC
BACTERIA UA: NONE SEEN
Bilirubin Urine: NEGATIVE
Glucose, UA: NEGATIVE mg/dL
Ketones, ur: NEGATIVE mg/dL
Leukocytes, UA: NEGATIVE
Nitrite: NEGATIVE
PROTEIN: 100 mg/dL — AB
SPECIFIC GRAVITY, URINE: 1.027 (ref 1.005–1.030)
pH: 6 (ref 5.0–8.0)

## 2017-06-14 LAB — COMPREHENSIVE METABOLIC PANEL
ALT: 36 U/L (ref 14–54)
ANION GAP: 11 (ref 5–15)
AST: 31 U/L (ref 15–41)
Albumin: 3.4 g/dL — ABNORMAL LOW (ref 3.5–5.0)
Alkaline Phosphatase: 51 U/L (ref 38–126)
BUN: 9 mg/dL (ref 6–20)
CO2: 22 mmol/L (ref 22–32)
CREATININE: 0.87 mg/dL (ref 0.44–1.00)
Calcium: 8.5 mg/dL — ABNORMAL LOW (ref 8.9–10.3)
Chloride: 102 mmol/L (ref 101–111)
GFR calc non Af Amer: >60 mL/min (ref >60)
Glucose, Bld: 112 mg/dL — ABNORMAL HIGH (ref 65–99)
Potassium: 3.3 mmol/L — ABNORMAL LOW (ref 3.5–5.1)
SODIUM: 135 mmol/L (ref 135–145)
Total Bilirubin: 0.4 mg/dL (ref 0.3–1.2)
Total Protein: 6.7 g/dL (ref 6.5–8.1)

## 2017-06-14 LAB — PROTIME-INR
INR: 0.94
PROTHROMBIN TIME: 12.5 s (ref 11.4–15.2)

## 2017-06-14 LAB — I-STAT BETA HCG BLOOD, ED (MC, WL, AP ONLY)

## 2017-06-14 LAB — I-STAT CG4 LACTIC ACID, ED: Lactic Acid, Venous: 1.01 mmol/L (ref 0.5–1.9)

## 2017-06-14 MED ORDER — IBUPROFEN 400 MG PO TABS
400.0000 mg | ORAL_TABLET | Freq: Once | ORAL | Status: AC
  Administered 2017-06-14: 400 mg via ORAL
  Filled 2017-06-14: qty 1

## 2017-06-14 MED ORDER — ALBUTEROL SULFATE (2.5 MG/3ML) 0.083% IN NEBU
5.0000 mg | INHALATION_SOLUTION | Freq: Once | RESPIRATORY_TRACT | Status: AC
  Administered 2017-06-14: 5 mg via RESPIRATORY_TRACT
  Filled 2017-06-14: qty 6

## 2017-06-14 NOTE — ED Triage Notes (Signed)
Pt sent by doctor today for further evaluation of pneumonia, fevers and SOB since Thursday, nonproductive cough

## 2017-06-14 NOTE — ED Notes (Signed)
Pt oral temp 102.3 requesting breathing treatment.

## 2017-06-15 ENCOUNTER — Inpatient Hospital Stay (HOSPITAL_COMMUNITY)
Admission: EM | Admit: 2017-06-15 | Discharge: 2017-06-17 | DRG: 871 | Disposition: A | Discharge status: Home / Self Care | Payer: Managed Care, Other (non HMO) | Attending: Internal Medicine | Admitting: Internal Medicine

## 2017-06-15 ENCOUNTER — Observation Stay (HOSPITAL_COMMUNITY): Payer: Managed Care, Other (non HMO)

## 2017-06-15 ENCOUNTER — Other Ambulatory Visit: Payer: Self-pay

## 2017-06-15 DIAGNOSIS — J189 Pneumonia, unspecified organism: Secondary | ICD-10-CM | POA: Diagnosis not present

## 2017-06-15 DIAGNOSIS — R05 Cough: Secondary | ICD-10-CM

## 2017-06-15 DIAGNOSIS — A419 Sepsis, unspecified organism: Principal | ICD-10-CM

## 2017-06-15 DIAGNOSIS — J188 Other pneumonia, unspecified organism: Secondary | ICD-10-CM

## 2017-06-15 DIAGNOSIS — D696 Thrombocytopenia, unspecified: Secondary | ICD-10-CM | POA: Diagnosis not present

## 2017-06-15 DIAGNOSIS — R059 Cough, unspecified: Secondary | ICD-10-CM

## 2017-06-15 DIAGNOSIS — G35 Multiple sclerosis: Secondary | ICD-10-CM | POA: Diagnosis present

## 2017-06-15 LAB — STREP PNEUMONIAE URINARY ANTIGEN: STREP PNEUMO URINARY ANTIGEN: NEGATIVE

## 2017-06-15 LAB — INFLUENZA PANEL BY PCR (TYPE A & B)
Influenza A By PCR: NEGATIVE
Influenza B By PCR: NEGATIVE

## 2017-06-15 LAB — HIV ANTIBODY (ROUTINE TESTING W REFLEX): HIV SCREEN 4TH GENERATION: NONREACTIVE

## 2017-06-15 MED ORDER — POTASSIUM CHLORIDE CRYS ER 20 MEQ PO TBCR
40.0000 meq | EXTENDED_RELEASE_TABLET | Freq: Once | ORAL | Status: AC
  Administered 2017-06-15: 40 meq via ORAL
  Filled 2017-06-15: qty 2

## 2017-06-15 MED ORDER — SODIUM CHLORIDE 0.9 % IV SOLN
500.0000 mg | Freq: Once | INTRAVENOUS | Status: AC
  Administered 2017-06-15: 500 mg via INTRAVENOUS
  Filled 2017-06-15: qty 500

## 2017-06-15 MED ORDER — PREDNISONE 20 MG PO TABS
20.0000 mg | ORAL_TABLET | Freq: Every day | ORAL | Status: DC
  Administered 2017-06-15 – 2017-06-17 (×3): 20 mg via ORAL
  Filled 2017-06-15 (×3): qty 1

## 2017-06-15 MED ORDER — SODIUM CHLORIDE 0.9 % IV SOLN
1.0000 g | INTRAVENOUS | Status: DC
  Administered 2017-06-15: 1 g via INTRAVENOUS
  Filled 2017-06-15: qty 10

## 2017-06-15 MED ORDER — ENOXAPARIN SODIUM 40 MG/0.4ML ~~LOC~~ SOLN
40.0000 mg | SUBCUTANEOUS | Status: DC
  Administered 2017-06-15: 40 mg via SUBCUTANEOUS
  Filled 2017-06-15: qty 0.4

## 2017-06-15 MED ORDER — AZITHROMYCIN 500 MG IV SOLR: 500.0000 mg | Freq: Once | INTRAVENOUS | Status: DC

## 2017-06-15 MED ORDER — SODIUM CHLORIDE 0.9 % IV SOLN
500.0000 mg | INTRAVENOUS | Status: DC
  Administered 2017-06-16: 500 mg via INTRAVENOUS
  Filled 2017-06-15: qty 500

## 2017-06-15 MED ORDER — GUAIFENESIN-DM 100-10 MG/5ML PO SYRP
10.0000 mL | ORAL_SOLUTION | Freq: Four times a day (QID) | ORAL | Status: DC
  Administered 2017-06-15 – 2017-06-17 (×8): 10 mL via ORAL
  Filled 2017-06-15 (×8): qty 10

## 2017-06-15 MED ORDER — LOPERAMIDE HCL 2 MG PO CAPS: 2.0000 mg | ORAL_CAPSULE | ORAL | Status: DC | PRN

## 2017-06-15 MED ORDER — SODIUM CHLORIDE 0.9 % IV BOLUS (SEPSIS)
1000.0000 mL | Freq: Once | INTRAVENOUS | Status: AC
  Administered 2017-06-15: 1000 mL via INTRAVENOUS

## 2017-06-15 MED ORDER — SODIUM CHLORIDE 0.9 % IV SOLN
1.0000 g | Freq: Once | INTRAVENOUS | Status: AC
  Administered 2017-06-15: 1 g via INTRAVENOUS
  Filled 2017-06-15: qty 10

## 2017-06-15 NOTE — Care Management Note (Signed)
Case Management Note  Patient Details  Name: Brittney Harrison MRN: [id number] Date of Birth: 1980/08/24  Subjective/Objective:    Pt admitted with multifocal pneumonia. She is from home with family.                Action/Plan: Plan is for her to return home when medically stable. CM following.   Expected Discharge Date:  06/17/17               Expected Discharge Plan:  Home/Self Care  In-House Referral:     Discharge planning Services     Post Acute Care Choice:    Choice offered to:     DME Arranged:    DME Agency:     HH Arranged:    HH Agency:     Status of Service:  In process, will continue to follow  If discussed at Long Length of Stay Meetings, dates discussed:    Additional Comments:  Kermit Balo, RN 06/15/2017, 11:48 AM

## 2017-06-15 NOTE — ED Provider Notes (Signed)
[facility] EMERGENCY DEPARTMENT Provider Note   CSN: [id number] Arrival date & time: 06/14/17  1942    History   Chief Complaint Chief Complaint  Patient presents with  . Pneumonia    HPI Brittney Harrison is a 37 y.o. female.  37 year old female with a history of thyroid disease, asthma, multiple sclerosis presents to the emergency department for evaluation of fever.  Fever has been present over the past week.  It has been tactile with maximum temperature of 104 F.  Symptoms associated with nonproductive cough as well as shortness of breath.  She reports worsening fatigue and malaise.  She complains of chest pain which is aggravated with deep breathing and coughing.  The patient saw her primary care doctor today who performed a chest x-ray which showed multifocal pneumonia.  Outpatient flu swab was reportedly negative, per patient.  She was advised to come to the emergency department for further workup of this multifocal pneumonia.  Patient febrile and tachycardic in triage.  She was given a breathing treatment for complaints of shortness of breath.  She reports little improvement with this treatment.      Past Medical History:  Diagnosis Date  . Abnormal Pap smear 2007   CIN-1  . 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 Asthma   . 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 Clitoral irritation 07/2006  . Complication of anesthesia    severe vomiting  . Fatigue   . Fibrocystic breast 2007  . Fullness of breast 01/2007   right  . GERD (gastroesophageal reflux disease)   . H/O seasonal allergies   . H/O varicella   . Headache(784.0)    migraines   . Hx: UTI (urinary tract infection)   . Increased BMI   . Irregular bleeding 10/2005  . Multiple sclerosis (HCC)   . Thyroid disease    Fluxuate between hyper to hypo  . 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 Vision abnormalities   . Yeast vaginitis 07/2006    Patient Active Problem List   Diagnosis Date Noted    . Multifocal pneumonia 06/15/2017  . Left optic neuritis 01/23/2017  . Multiple sclerosis (HCC) 10/13/2016  . Gait disturbance 10/13/2016  . Other fatigue 10/13/2016  . Numbness 10/13/2016  . Urinary urgency 10/13/2016  . Kell isoimmunization during pregnancy   . AMA (advanced maternal age) multigravida 35+   . Multiple sclerosis complicating pregnancy (HCC)   . Advanced maternal age in multigravida   . Vaginal delivery 11/19/2011  . PPH (postpartum hemorrhage) 11/19/2011  . Thyroid disease 07/27/2011  . Asthma 07/27/2011  . GERD (gastroesophageal reflux disease) 07/27/2011  . IBS (irritable bowel syndrome) 07/27/2011  . Migraines 07/27/2011  . Tobacco abuse 07/27/2011    Past Surgical History:  Procedure Laterality Date  . CHOLECYSTECTOMY  09/11/91  . DILATION AND CURETTAGE OF UTERUS  2011   endometritis  . DILATION AND CURETTAGE OF UTERUS  11/19/2011   Procedure: DILATATION AND CURETTAGE;  Surgeon: Hal Morales, MD;  Location: [facility];  Service: Gynecology;  Laterality: N/A;  dilitation and currettage with repair of intraoperative cervical laceration.  . TONSILLECTOMY  10/28/97  . WISDOM TOOTH EXTRACTION  2001    OB History    Gravida Para Term Preterm AB Living   4 3 3   1 3    SAB TAB Ectopic Multiple Live Births     1   0 3       Home Medications  Prior to Admission medications   Medication Sig Start Date End Date Taking? Authorizing Provider  cetirizine (ZYRTEC) 10 MG tablet Take 10 mg by mouth daily.   Yes [provider]  Cholecalciferol (VITAMIN D PO) Take 1 tablet by mouth daily.   Yes [provider]  predniSONE (DELTASONE) 20 MG tablet Take 20 mg by mouth daily with breakfast.   Yes [provider]    Family History Family History  Problem Relation Age of Onset  . Heart disease Father   . Alcohol abuse Father   . Melanoma Father   . Cancer Maternal Aunt        Thyroid & breast  . Cancer Paternal Uncle         Esophageal  . Diabetes Maternal Grandmother   . Cancer Maternal Grandmother        Kidney  . Cancer Paternal Grandmother   . Hypothyroidism Mother   . Multiple sclerosis Cousin     Social History Social History   Tobacco Use  . Smoking status: Former Smoker    Packs/day: 1.00    Years: 10.00    Pack years: 10.00    Types: Cigarettes    Last attempt to quit: 02/13/2011    Years since quitting: 6.3  . Smokeless tobacco: Never Used  Substance Use Topics  . Alcohol use: No  . Drug use: No     Allergies   Codeine; Eye-sed ophthalmic [zinc]; and Percocet [oxycodone-acetaminophen]   Review of Systems Review of Systems Ten systems reviewed and are negative for acute change, except as noted in the HPI.    Physical Exam Updated Vital Signs BP 111/68   Pulse (!) 103   Temp 99.5 F (37.5 C) (Oral)   Resp 18   SpO2 90%   Physical Exam  Constitutional: She is oriented to person, place, and time. She appears well-developed and well-nourished. No distress.  Nontoxic and in NAD. Appears fatigued.  HENT:  Head: Normocephalic and atraumatic.  Eyes: Conjunctivae and EOM are normal. No scleral icterus.  Neck: Normal range of motion.  Cardiovascular: Normal rate, regular rhythm and intact distal pulses.  Not tachycardic as noted in triage  Pulmonary/Chest: Effort normal. No respiratory distress. She has no wheezes.  Decreased BS in LLL. No wheezing. Respirations even, unlabored. SpO2 90-94% on RA.  Musculoskeletal: Normal range of motion.  Neurological: She is alert and oriented to person, place, and time. She exhibits normal muscle tone. Coordination normal.  GCS 15. Patient moving all extremities.  Skin: Skin is warm and dry. No rash noted. She is not diaphoretic. No erythema. No pallor.  Psychiatric: She has a normal mood and affect. Her behavior is normal.  Nursing note and vitals reviewed.    ED Treatments / Results  Labs (all labs ordered are listed, but only  abnormal results are displayed) Labs Reviewed  COMPREHENSIVE METABOLIC PANEL - Abnormal; Notable for the following components:      Result Value   Potassium 3.3 (*)    Glucose, Bld 112 (*)    Calcium 8.5 (*)    Albumin 3.4 (*)    All other components within normal limits  CBC WITH DIFFERENTIAL/PLATELET - Abnormal; Notable for the following components:   RBC 5.13 (*)    Hemoglobin 15.9 (*)    Platelets 93 (*)    All other components within normal limits  URINALYSIS, ROUTINE W REFLEX MICROSCOPIC - Abnormal; Notable for the following components:   Color, Urine AMBER (*)  APPearance HAZY (*)    Hgb urine dipstick MODERATE (*)    Protein, ur 100 (*)    Squamous Epithelial / LPF 0-5 (*)    All other components within normal limits  CULTURE, BLOOD (ROUTINE X 2)  CULTURE, BLOOD (ROUTINE X 2)  CULTURE, EXPECTORATED SPUTUM-ASSESSMENT  GRAM STAIN  PROTIME-INR  HIV ANTIBODY (ROUTINE TESTING)  STREP PNEUMONIAE URINARY ANTIGEN  INFLUENZA PANEL BY PCR (TYPE A & B)  I-STAT CG4 LACTIC ACID, ED  I-STAT BETA HCG BLOOD, ED ([facility], [facility], [facility] ONLY)    EKG  EKG Interpretation None       Radiology No results found.   Procedures Procedures (including critical care time)  Medications Ordered in ED Medications  cefTRIAXone (ROCEPHIN) 1 g in sodium chloride 0.9 % 100 mL IVPB (not administered)  azithromycin (ZITHROMAX) 500 mg in sodium chloride 0.9 % 250 mL IVPB (not administered)  cefTRIAXone (ROCEPHIN) 1 g in sodium chloride 0.9 % 100 mL IVPB (not administered)  azithromycin (ZITHROMAX) 500 mg in sodium chloride 0.9 % 250 mL IVPB (not administered)  predniSONE (DELTASONE) tablet 20 mg (not administered)  enoxaparin (LOVENOX) injection 40 mg (not administered)  albuterol (PROVENTIL) (2.5 MG/3ML) 0.083% nebulizer solution 5 mg (5 mg Nebulization Given 06/14/17 2359)  ibuprofen (ADVIL,MOTRIN) tablet 400 mg (400 mg Oral Given 06/14/17 2358)  sodium chloride 0.9 % bolus 1,000 mL (1,000 mLs  Intravenous New Bag/Given 06/15/17 0227)    CRITICAL CARE Performed by: Antony Madura   Total critical care time: 35 minutes  Critical care time was exclusive of separately billable procedures and treating other patients.  Critical care was necessary to treat or prevent imminent or life-threatening deterioration.  Critical care was time spent personally by me on the following activities: development of treatment plan with patient and/or surrogate as well as nursing, discussions with consultants, evaluation of patient's response to treatment, examination of patient, obtaining history from patient or surrogate, ordering and performing treatments and interventions, ordering and review of laboratory studies, ordering and review of radiographic studies, pulse oximetry and re-evaluation of patient's condition.   Initial Impression / Assessment and Plan / ED Course  I have reviewed the triage vital signs and the nursing notes.  Pertinent labs & imaging results that were available during my care of the patient were reviewed by me and considered in my medical decision making (see chart for details).     37 year old female presents to the emergency department for fever over the past week with maximum temperature 104 F.  She reports negative outpatient flu swab with chest x-ray concerning for multifocal pneumonia (imaging in PACS).  Patient febrile and tachycardic while in the emergency department.  Borderline low oxygen saturations on room air.  Blood cultures pending.  Will give IV fluids as well as antibiotics for acute management.  Patient does meet sepsis criteria.  Concern for mild immunosuppression given use of daily prednisone (future plans to transition to MTX).  Plan for observation admission to [facility] for further treatment and monitoring.  Case discussed with [facility] Julian Reil of [facility] who will admit.   Final Clinical Impressions(s) / ED Diagnoses   Final diagnoses:  Sepsis, due to unspecified  organism [facility])  Multifocal pneumonia  Thrombocytopenia [facility])    ED Discharge Orders    None       Antony Madura, PA-C 06/15/17 1308    Geoffery Lyons, MD 06/15/17 [phone]

## 2017-06-15 NOTE — Progress Notes (Addendum)
Family Medicine Teaching Service Daily Progress Note Intern Pager: [phone]  Patient name: Brittney Harrison Medical record number: [id number] Date of birth: 12/04/1980 Age: 37 y.o. Gender: female  Primary Care Provider: Farris Has, MD Consultants: None Code Status: Full  Pt Overview and Major Events to Date:  Admitted on 3/7   Assessment and Plan: Brittney Harrison is a 37 y.o. female with medical history significant for asthma, multiple sclerosis on prednisone) who presented with cough, fever and shortness of breath for the past week and found to have mulitfocal pneumonia at PCP office.   #Multifocal Pneumonia, acute, improving Patient presented with cough, shortness of breath, fever for the past week found to have multifocal pneumonia. Patient started on ceftriaxone and azithromycin. Currently fever has resolved and rest of vitals are within normal limits. O2 sat in the low 90's with still mild cough. Flu negative. --Continue Ceftriaxone and Azithromycin --Follow up on Blood and urine cultures --F/u on strep pneumo antigen --O2 therapy PRN --Repeat CXR --Follow up on am CBC, BMP  #Multiple sclerosis, chronic Diagnosed in 2014, followed by Dr. Roselyn Bering at [facility].  --Continue prednisone 20 mg daily --Consider cortisol level --PT/OT consult  #Hypokalemia K+3.3.  --Replete as needed --Follow up on am BMP  FEN/GI: Regular Diet  PPx: Lovenox  Disposition: Home pending clinical improvement  Subjective:  Patient continue to have mild cough, but shortness of breath has improved. Patient is taking po and able to ambulate.  Objective: Temp:  [98.6 F (37 C)-102.3 F (39.1 C)] 98.6 F (37 C) (03/07 0343) Pulse Rate:  [91-120] 91 (03/07 0343) Resp:  [17-21] 20 (03/07 0343) BP: (103-122)/(60-79) 103/65 (03/07 0343) SpO2:  [90 %-99 %] 93 % (03/07 0343)   Physical Exam: General: Tired appearing woman in bed, appropriate and able to participate in exam Cardiac:  RRR, normal heart sounds, no murmurs. 2+ radial and PT pulses bilaterally Respiratory: Crackles noted at the bases R>L, No wheezes, rales or rhonchi Abdomen: soft, nontender, nondistended, no hepatic or splenomegaly, +BS Extremities: no edema or cyanosis. WWP. Skin: warm and dry, no rashes noted Neuro: alert and oriented x4, no focal deficits Psych: Normal affect and mood Laboratory: Recent Labs  Lab 06/14/17 2023  WBC 5.0  HGB 15.9*  HCT 45.7  PLT 93*   Recent Labs  Lab 06/14/17 2023  NA 135  K 3.3*  CL 102  CO2 22  BUN 9  CREATININE 0.87  CALCIUM 8.5*  PROT 6.7  BILITOT 0.4  ALKPHOS 51  ALT 36  AST 31  GLUCOSE 112*    Imaging/Diagnostic Tests: No results found.   Lovena Neighbours, MD 06/15/2017, 8:16 AM PGY-2, [place] [facility] FPTS Intern pager: [phone], text pages welcome

## 2017-06-15 NOTE — Plan of Care (Signed)
  Clinical Measurements: Will remain free from infection 06/15/2017 0420 - Progressing by Earnest Rosier, RN   Nutrition: Adequate nutrition will be maintained 06/15/2017 0420 - Progressing by Earnest Rosier, RN   Safety: Ability to remain free from injury will improve 06/15/2017 0420 - Progressing by Earnest Rosier, RN

## 2017-06-15 NOTE — ED Notes (Signed)
Admitting MD at bedside.

## 2017-06-15 NOTE — H&P (Signed)
History and Physical    Brittney Harrison ZOX:[id number] DOB: May 21, 1980 DOA: 06/15/2017  PCP: Farris Has, MD  Patient coming from: Home  I have personally briefly reviewed patient's old medical records in [facility]  Chief Complaint: Multifocal PNA  HPI: Brittney Harrison is a 37 y.o. female with medical history significant of asthma, MS on prednisone.  Patient presents to the ED with 1 week history of fever, non-productive cough, SOB.  Tm 104.  CP worse with deep breathing and coughing.  Saw PCP today, he got CXR on patient (available in PACS) which shows multifocal PNA.  Rapid flu neg.   ED Course: WBC 5.0.   Review of Systems: As per HPI otherwise 10 point review of systems negative.   Past Medical History:  Diagnosis Date  . Abnormal Pap smear 2007   CIN-1  . 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 Asthma   . 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 Clitoral irritation 07/2006  . Complication of anesthesia    severe vomiting  . Fatigue   . Fibrocystic breast 2007  . Fullness of breast 01/2007   right  . GERD (gastroesophageal reflux disease)   . H/O seasonal allergies   . H/O varicella   . Headache(784.0)    migraines   . Hx: UTI (urinary tract infection)   . Increased BMI   . Irregular bleeding 10/2005  . Multiple sclerosis (HCC)   . Thyroid disease    Fluxuate between hyper to hypo  . 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 Vision abnormalities   . Yeast vaginitis 07/2006    Past Surgical History:  Procedure Laterality Date  . CHOLECYSTECTOMY  09/11/91  . DILATION AND CURETTAGE OF UTERUS  2011   endometritis  . DILATION AND CURETTAGE OF UTERUS  11/19/2011   Procedure: DILATATION AND CURETTAGE;  Surgeon: Hal Morales, MD;  Location: [facility];  Service: Gynecology;  Laterality: N/A;  dilitation and currettage with repair of intraoperative cervical laceration.  . TONSILLECTOMY  10/28/97  . WISDOM TOOTH EXTRACTION  2001     reports that she quit smoking about 6  years ago. Her smoking use included cigarettes. She has a 10.00 pack-year smoking history. she has never used smokeless tobacco. She reports that she does not drink alcohol or use drugs.  Allergies  Allergen Reactions  . Codeine Nausea And Vomiting  . Eye-Sed Ophthalmic [Zinc] Itching    Eye gel   . Percocet [Oxycodone-Acetaminophen] Nausea And Vomiting    Family History  Problem Relation Age of Onset  . Heart disease Father   . Alcohol abuse Father   . Melanoma Father   . Cancer Maternal Aunt        Thyroid & breast  . Cancer Paternal Uncle        Esophageal  . Diabetes Maternal Grandmother   . Cancer Maternal Grandmother        Kidney  . Cancer Paternal Grandmother   . Hypothyroidism Mother   . Multiple sclerosis Cousin      Prior to Admission medications   Medication Sig Start Date End Date Taking? Authorizing Provider  cetirizine (ZYRTEC) 10 MG tablet Take 10 mg by mouth daily.   Yes [provider]  Cholecalciferol (VITAMIN D PO) Take 1 tablet by mouth daily.   Yes [provider]  predniSONE (DELTASONE) 20 MG tablet Take 20 mg by mouth daily with breakfast.   Yes [provider]    Physical Exam: Vitals:  06/15/17 0139 06/15/17 0145 06/15/17 0200 06/15/17 0215  BP: 111/68 115/74 116/70 111/73  Pulse: (!) 103 100 99 (!) 101  Resp: 18     Temp: 99.5 F (37.5 C)     TempSrc: Oral     SpO2: 90% 92% 93% 94%    Constitutional: NAD, calm, comfortable Eyes: PERRL, lids and conjunctivae normal ENMT: Mucous membranes are moist. Posterior pharynx clear of any exudate or lesions.Normal dentition.  Neck: normal, supple, no masses, no thyromegaly Respiratory: clear to auscultation bilaterally, no wheezing, no crackles. Normal respiratory effort. No accessory muscle use.  Cardiovascular: Regular rate and rhythm, no murmurs / rubs / gallops. No extremity edema. 2+ pedal pulses. No carotid bruits.  Abdomen: no tenderness, no masses palpated. No  hepatosplenomegaly. Bowel sounds positive.  Musculoskeletal: no clubbing / cyanosis. No joint deformity upper and lower extremities. Good ROM, no contractures. Normal muscle tone.  Skin: no rashes, lesions, ulcers. No induration Neurologic: CN 2-12 grossly intact. Sensation intact, DTR normal. Strength 5/5 in all 4.  Psychiatric: Normal judgment and insight. Alert and oriented x 3. Normal mood.    Labs on Admission: I have personally reviewed following labs and imaging studies  CBC: Recent Labs  Lab 06/14/17 2023  WBC 5.0  NEUTROABS 2.3  HGB 15.9*  HCT 45.7  MCV 89.1  PLT 93*   Basic Metabolic Panel: Recent Labs  Lab 06/14/17 2023  NA 135  K 3.3*  CL 102  CO2 22  GLUCOSE 112*  BUN 9  CREATININE 0.87  CALCIUM 8.5*   GFR: CrCl cannot be calculated (Unknown ideal weight.). Liver Function Tests: Recent Labs  Lab 06/14/17 2023  AST 31  ALT 36  ALKPHOS 51  BILITOT 0.4  PROT 6.7  ALBUMIN 3.4*   No results for input(s): LIPASE, AMYLASE in the last 168 hours. No results for input(s): AMMONIA in the last 168 hours. Coagulation Profile: Recent Labs  Lab 06/14/17 2023  INR 0.94   Cardiac Enzymes: No results for input(s): CKTOTAL, CKMB, CKMBINDEX, TROPONINI in the last 168 hours. BNP (last 3 results) No results for input(s): PROBNP in the last 8760 hours. HbA1C: No results for input(s): HGBA1C in the last 72 hours. CBG: No results for input(s): GLUCAP in the last 168 hours. Lipid Profile: No results for input(s): CHOL, HDL, LDLCALC, TRIG, CHOLHDL, LDLDIRECT in the last 72 hours. Thyroid Function Tests: No results for input(s): TSH, T4TOTAL, FREET4, T3FREE, THYROIDAB in the last 72 hours. Anemia Panel: No results for input(s): VITAMINB12, FOLATE, FERRITIN, TIBC, IRON, RETICCTPCT in the last 72 hours. Urine analysis:    Component Value Date/Time   COLORURINE AMBER (A) 06/14/2017 2024   APPEARANCEUR HAZY (A) 06/14/2017 2024   LABSPEC 1.027 06/14/2017 2024    PHURINE 6.0 06/14/2017 2024   GLUCOSEU NEGATIVE 06/14/2017 2024   HGBUR MODERATE (A) 06/14/2017 2024   BILIRUBINUR NEGATIVE 06/14/2017 2024   BILIRUBINUR NEG 01/17/2012 1639   KETONESUR NEGATIVE 06/14/2017 2024   PROTEINUR 100 (A) 06/14/2017 2024   UROBILINOGEN negative 02/20/2012 1112   UROBILINOGEN 0.2 11/18/2011 1418   NITRITE NEGATIVE 06/14/2017 2024   LEUKOCYTESUR NEGATIVE 06/14/2017 2024    Radiological Exams on Admission: No results found.  EKG: Independently reviewed.  Assessment/Plan Principal Problem:   Multifocal pneumonia Active Problems:   Multiple sclerosis (HCC)    1. Multifocal CAP - 1. PNA pathway 2. Rocephin / azithro 3. Cultures pending 4. Influenza PCR pending 2. MS - 1. Continue prednisone at 20mg  daily for now (no evidence  of adrenal crisis at this time).  DVT prophylaxis: Lovenox Code Status: Full Family Communication: Family at bedside Disposition Plan: Home after admit Consults called: None Admission status: Place in [place], [place]wood Iles. DO [facility] Pager [phone]  If 7AM-7PM, please contact day team taking care of patient [email or web address] Password [facility]  06/15/2017, 2:37 AM

## 2017-06-16 DIAGNOSIS — Z888 Allergy status to other drugs, medicaments and biological substances status: Secondary | ICD-10-CM | POA: Diagnosis not present

## 2017-06-16 DIAGNOSIS — J189 Pneumonia, unspecified organism: Secondary | ICD-10-CM | POA: Diagnosis present

## 2017-06-16 DIAGNOSIS — D696 Thrombocytopenia, unspecified: Secondary | ICD-10-CM | POA: Diagnosis present

## 2017-06-16 DIAGNOSIS — Z885 Allergy status to narcotic agent status: Secondary | ICD-10-CM | POA: Diagnosis not present

## 2017-06-16 DIAGNOSIS — E876 Hypokalemia: Secondary | ICD-10-CM | POA: Diagnosis present

## 2017-06-16 DIAGNOSIS — A419 Sepsis, unspecified organism: Secondary | ICD-10-CM | POA: Diagnosis present

## 2017-06-16 DIAGNOSIS — G35 Multiple sclerosis: Secondary | ICD-10-CM | POA: Diagnosis present

## 2017-06-16 DIAGNOSIS — J45909 Unspecified asthma, uncomplicated: Secondary | ICD-10-CM | POA: Diagnosis present

## 2017-06-16 DIAGNOSIS — Z7952 Long term (current) use of systemic steroids: Secondary | ICD-10-CM | POA: Diagnosis not present

## 2017-06-16 DIAGNOSIS — Z79899 Other long term (current) drug therapy: Secondary | ICD-10-CM | POA: Diagnosis not present

## 2017-06-16 LAB — BASIC METABOLIC PANEL
ANION GAP: 9 (ref 5–15)
BUN: <5 mg/dL — ABNORMAL LOW (ref 6–20)
CALCIUM: 8.1 mg/dL — AB (ref 8.9–10.3)
CO2: 23 mmol/L (ref 22–32)
Chloride: 106 mmol/L (ref 101–111)
Creatinine, Ser: 0.76 mg/dL (ref 0.44–1.00)
Glucose, Bld: 144 mg/dL — ABNORMAL HIGH (ref 65–99)
Potassium: 3.1 mmol/L — ABNORMAL LOW (ref 3.5–5.1)
SODIUM: 138 mmol/L (ref 135–145)

## 2017-06-16 LAB — CBC
HCT: 40.2 % (ref 36.0–46.0)
Hemoglobin: 13.5 g/dL (ref 12.0–15.0)
MCH: 30.2 pg (ref 26.0–34.0)
MCHC: 33.6 g/dL (ref 30.0–36.0)
MCV: 89.9 fL (ref 78.0–100.0)
Platelets: 115 10*3/uL — ABNORMAL LOW (ref 150–400)
RBC: 4.47 MIL/uL (ref 3.87–5.11)
RDW: 15.2 % (ref 11.5–15.5)
WBC: 5.8 10*3/uL (ref 4.0–10.5)

## 2017-06-16 MED ORDER — IBUPROFEN 200 MG PO TABS
600.0000 mg | ORAL_TABLET | Freq: Three times a day (TID) | ORAL | Status: DC | PRN
  Administered 2017-06-16: 600 mg via ORAL
  Filled 2017-06-16: qty 3

## 2017-06-16 MED ORDER — LEVOFLOXACIN 500 MG PO TABS
750.0000 mg | ORAL_TABLET | Freq: Every day | ORAL | Status: DC
  Administered 2017-06-16 – 2017-06-17 (×2): 750 mg via ORAL
  Filled 2017-06-16 (×2): qty 2

## 2017-06-16 MED ORDER — POTASSIUM CHLORIDE CRYS ER 20 MEQ PO TBCR
40.0000 meq | EXTENDED_RELEASE_TABLET | Freq: Two times a day (BID) | ORAL | Status: DC
  Administered 2017-06-16 – 2017-06-17 (×3): 40 meq via ORAL
  Filled 2017-06-16 (×4): qty 2

## 2017-06-16 NOTE — Progress Notes (Signed)
Family Medicine Teaching Service Daily Progress Note Intern Pager: [phone]  Patient name: Brittney Harrison Medical record number: [id number] Date of birth: [birth year] Age: 37 y.o. Gender: female  Primary Care Provider: Farris Has, MD Consultants: None Code Status: Full  Pt Overview and Major Events to Date:  Admitted on 3/7   Assessment and Plan: Brittney Harrison is a 37 y.o. female with medical history significant for asthma, multiple sclerosis on prednisone) who presented with cough, fever and shortness of breath for the past week and found to have mulitfocal pneumonia at PCP office.   #Sepsis in the setting of multifocal pneumonia, acute,resolving  Patient had a fever yesterday afternoon while of IV antibiotics. She was also placed on [place] mostly for comfort as she reports it improve her breathing. She still reports cough and mild shortness of breath. Blood cultures have shown no growth to date. Sputum culture is still pending. Strep pneumo was negative. Encourage patient to get out of bed and sit in chair. --Discontinue Ceftriaxone and Azithromycin --Continue to follow blood and sputum cultures --Could consider legionella if patient does not improve clinically --O2 therapy PRN --Repeat CXR as needed --Follow up on am CBC, BMP --Continue incentive spirometry and flutter valve   #Multiple sclerosis, chronic Diagnosed in 2014, followed by Dr. Roselyn Bering at [facility].  --Continue prednisone 20 mg daily --PT/OT consult  #Hypokalemia,  Patient continue to be hypokalemic. 3.1 this morning. Likely secondary to pneumonia. --Replete as needed --Follow up on am BMP  FEN/GI: Regular Diet  PPx: Lovenox  Disposition: Home pending clinical improvement  Subjective:  Patient continue to have mild cough, but shortness of breath has improved.   Objective: Temp:  [98.3 F (36.8 C)-101 F (38.3 C)] 98.3 F (36.8 C) (03/08 0834) Pulse Rate:  [84-96] 84 (03/08 0834) Resp:   [16-20] 18 (03/08 0834) BP: (100-118)/(16-73) 111/73 (03/08 0834) SpO2:  [92 %-98 %] 98 % (03/08 0834) Weight:  [208 lb 12.4 oz (94.7 kg)] 208 lb 12.4 oz (94.7 kg) (03/07 2337)   Physical Exam: General: Tired appearing woman in bed, appropriate and able to participate in exam Cardiac: RRR, normal heart sounds, no murmurs. 2+ radial and PT pulses bilaterally Respiratory: Crackles noted at the bases R>L, No wheezes, rales or rhonchi Abdomen: soft, nontender, nondistended, no hepatic or splenomegaly, +BS Extremities: no edema or cyanosis. WWP. Skin: warm and dry, no rashes noted Neuro: alert and oriented x4, no focal deficits Psych: Normal affect and mood Laboratory: Recent Labs  Lab 06/14/17 2023 06/16/17 0331  WBC 5.0 5.8  HGB 15.9* 13.5  HCT 45.7 40.2  PLT 93* 115*   Recent Labs  Lab 06/14/17 2023 06/16/17 0331  NA 135 138  K 3.3* 3.1*  CL 102 106  CO2 22 23  BUN 9 <5*  CREATININE 0.87 0.76  CALCIUM 8.5* 8.1*  PROT 6.7  --   BILITOT 0.4  --   ALKPHOS 51  --   ALT 36  --   AST 31  --   GLUCOSE 112* 144*    Imaging/Diagnostic Tests: Dg Chest 2 View  Result Date: 06/15/2017 CLINICAL DATA:  One week of cough and shortness of breath. History of asthma. EXAM: CHEST - 2 VIEW COMPARISON:  June 14, 2017 FINDINGS: Again demonstrated are patchy airspace opacities bilaterally. There is no pleural effusion. The heart and pulmonary vascularity are normal. The trachea is midline. The bony thorax exhibits no acute abnormality. IMPRESSION: Persistent bilateral alveolar opacities most compatible with pneumonia. Underlying changes of  reactive airway disease are present and stable. Electronically Signed   By: David  [place] M.D.   On: 06/15/2017 10:23     Lovena Neighbours, MD 06/16/2017, 8:46 AM PGY-2, [place] [facility] FPTS Intern pager: [phone], text pages welcome

## 2017-06-17 LAB — BASIC METABOLIC PANEL
ANION GAP: 10 (ref 5–15)
BUN: <5 mg/dL — ABNORMAL LOW (ref 6–20)
CALCIUM: 9 mg/dL (ref 8.9–10.3)
CO2: 27 mmol/L (ref 22–32)
Chloride: 103 mmol/L (ref 101–111)
Creatinine, Ser: 0.66 mg/dL (ref 0.44–1.00)
GFR calc Af Amer: >60 mL/min (ref >60)
Glucose, Bld: 110 mg/dL — ABNORMAL HIGH (ref 65–99)
Potassium: 4.2 mmol/L (ref 3.5–5.1)
Sodium: 140 mmol/L (ref 135–145)

## 2017-06-17 LAB — CBC
HCT: 39.1 % (ref 36.0–46.0)
HEMOGLOBIN: 13.2 g/dL (ref 12.0–15.0)
MCH: 30.3 pg (ref 26.0–34.0)
MCHC: 33.8 g/dL (ref 30.0–36.0)
MCV: 89.9 fL (ref 78.0–100.0)
PLATELETS: 151 10*3/uL (ref 150–400)
RBC: 4.35 MIL/uL (ref 3.87–5.11)
RDW: 14.8 % (ref 11.5–15.5)
WBC: 5.4 10*3/uL (ref 4.0–10.5)

## 2017-06-17 MED ORDER — LEVOFLOXACIN 750 MG PO TABS: 750.0000 mg | ORAL_TABLET | Freq: Every day | ORAL | 0 refills | Status: AC

## 2017-06-17 MED ORDER — BENZONATATE 100 MG PO CAPS
100.0000 mg | ORAL_CAPSULE | Freq: Two times a day (BID) | ORAL | Status: DC | PRN
  Administered 2017-06-17: 100 mg via ORAL
  Filled 2017-06-17: qty 1

## 2017-06-17 MED ORDER — BENZONATATE 100 MG PO CAPS: 100.0000 mg | ORAL_CAPSULE | Freq: Two times a day (BID) | ORAL | 0 refills | Status: DC | PRN

## 2017-06-17 NOTE — Progress Notes (Signed)
Patient ambulated during shift, patient tolerated well however became SOB once back in the bed.

## 2017-06-17 NOTE — Discharge Summary (Signed)
Discharge Summary  Launa Goedken WUJ:[id number] DOB: [birth year]  PCP: Farris Has, MD  Admit date: 06/15/2017 Discharge date: 06/17/2017  Time spent: < 25 minutes  Admitted From: Home Disposition: Home Recommendations for Outpatient Follow-up:  1. Follow up with PCP in 1-2 weeks. 2. Levaquin to complete for a total of 7 days, end date 06/24/17   Home Health: No Equipment/Devices: None  Discharge Diagnoses:  Active Hospital Problems   Diagnosis Date Noted  . Multifocal pneumonia 06/15/2017  . Pneumonia 06/16/2017  . Multiple sclerosis (HCC) 10/13/2016    Resolved Hospital Problems  No resolved problems to display.    Discharge Condition: Stable  CODE STATUS: Full Diet recommendation: Heart Healthy   Vitals:   06/17/17 0800 06/17/17 1215  BP: 114/61 114/67  Pulse: 79 83  Resp: 18 18  Temp: 98.4 F (36.9 C) 98.4 F (36.9 C)  SpO2: 95% 97%    History of present illness:  Denelda Akerley is a 37 y.o. year old female with medical history significant formultiple sclerosis on chronic prednisone, asthma who presented on 3/7 with 1 week of nonproductive cough, shortness of breath and fever and was found to have community-acquired a focal pneumonia.   Hospital Course:  Principal Problem:   Multifocal pneumonia Active Problems:   Multiple sclerosis (HCC)   Pneumonia   #Sepsis secondary to multifocal pneumonia -Sick contacts included her children - On admission T-max 102.3, heart rate 120, respiratory rate 21, requiring 2 L oxygen -Chest x-ray consistent with multifocal pneumonia -Strep pneumo, Legionella, flu panel negative, blood cultures remain negative - Responded well to IV ceftriaxone and azithromycin -Tolerated transition to Levaquin - Supportive care with Jerilynn Som, incentive spirometry - Successfully weaned off oxygen, normal walking oxygen test on room air on day of discharge  #Multiple sclerosis -Continued chronic  prednisone    Antibiotics: Azithromycin 3/7-3/8 Ceftriaxone 3/7 Levaquin 3/8--  Microbiology:  Consultations: None  Procedures/Studies:  Dg Chest 2 View  Result Date: 06/15/2017 CLINICAL DATA:  One week of cough and shortness of breath. History of asthma. EXAM: CHEST - 2 VIEW COMPARISON:  June 14, 2017 FINDINGS: Again demonstrated are patchy airspace opacities bilaterally. There is no pleural effusion. The heart and pulmonary vascularity are normal. The trachea is midline. The bony thorax exhibits no acute abnormality. IMPRESSION: Persistent bilateral alveolar opacities most compatible with pneumonia. Underlying changes of reactive airway disease are present and stable. Electronically Signed   By: David  [place] M.D.   On: 06/15/2017 10:23      Discharge Exam: BP 114/67 (BP Location: Left Arm)   Pulse 83   Temp 98.4 F (36.9 C) (Oral)   Resp 18   Ht 5\' 7"  (1.702 m)   Wt 94.7 kg (208 lb 12.4 oz)   SpO2 97%   BMI 32.70 kg/m   General: Lying in bed, no apparent distress Eyes: EOMI, anicteric ENT: Oral Mucosa clear and moist Cardiovascular: regular rate and rhythm, no murmurs, rubs or gallops, no edema,  Respiratory: Normal respiratory effort on room air, occasional rhonchi in lung fields, breath sounds heard throughout Abdomen: soft, non-distended, non-tender, normal bowel sounds Skin: No Rash Musculoskeletal:Good ROM, no contractures. Normal muscle tone Neurologic: Grossly no focal neuro deficit.Mental status AAOx3,  Psychiatric:Appropriate affect, and mood   Discharge Instructions You were cared for by a hospitalist during your hospital stay. If you have any questions about your discharge medications or the care you received while you were in the hospital after you are discharged, you can call the unit  and asked to speak with the hospitalist on call if the hospitalist that took care of you is not available. Once you are discharged, your primary care physician will handle  any further medical issues. Please note that NO REFILLS for any discharge medications will be authorized once you are discharged, as it is imperative that you return to your primary care physician (or establish a relationship with a primary care physician if you do not have one) for your aftercare needs so that they can reassess your need for medications and monitor your lab values.  Discharge Instructions    Diet - low sodium heart healthy   Complete by:  As directed    Increase activity slowly   Complete by:  As directed      Allergies as of 06/17/2017      Reactions   Codeine Nausea And Vomiting   Eye-sed Ophthalmic [zinc] Itching   Eye gel    Percocet [oxycodone-acetaminophen] Nausea And Vomiting      Medication List    TAKE these medications   benzonatate 100 MG capsule Commonly known as:  TESSALON Take 1 capsule (100 mg total) by mouth 2 (two) times daily as needed for cough.   cetirizine 10 MG tablet Commonly known as:  ZYRTEC Take 10 mg by mouth daily.   levofloxacin 750 MG tablet Commonly known as:  LEVAQUIN Take 1 tablet (750 mg total) by mouth daily for 7 days. Start taking on:  06/18/2017   predniSONE 20 MG tablet Commonly known as:  DELTASONE Take 20 mg by mouth daily with breakfast.   VITAMIN D PO Take 1 tablet by mouth daily.      Allergies  Allergen Reactions  . Codeine Nausea And Vomiting  . Eye-Sed Ophthalmic [Zinc] Itching    Eye gel   . Percocet [Oxycodone-Acetaminophen] Nausea And Vomiting      The results of significant diagnostics from this hospitalization (including imaging, microbiology, ancillary and laboratory) are listed below for reference.    Significant Diagnostic Studies: Dg Chest 2 View  Result Date: 06/15/2017 CLINICAL DATA:  One week of cough and shortness of breath. History of asthma. EXAM: CHEST - 2 VIEW COMPARISON:  June 14, 2017 FINDINGS: Again demonstrated are patchy airspace opacities bilaterally. There is no pleural  effusion. The heart and pulmonary vascularity are normal. The trachea is midline. The bony thorax exhibits no acute abnormality. IMPRESSION: Persistent bilateral alveolar opacities most compatible with pneumonia. Underlying changes of reactive airway disease are present and stable. Electronically Signed   By: David  [place] M.D.   On: 06/15/2017 10:23    Microbiology: Recent Results (from the past 240 hour(s))  Culture, blood (Routine x 2)     Status: None (Preliminary result)   Collection Time: 06/14/17  8:23 PM  Result Value Ref Range Status   Specimen Description BLOOD LEFT ANTECUBITAL  Final   Special Requests IN PEDIATRIC BOTTLE Blood Culture adequate volume  Final   Culture   Final    NO GROWTH 3 DAYS Performed at [facility], [address], [place], [place] [address]    Report Status PENDING  Incomplete  Culture, blood (Routine x 2)     Status: None (Preliminary result)   Collection Time: 06/14/17  8:24 PM  Result Value Ref Range Status   Specimen Description BLOOD RIGHT HAND  Final   Special Requests IN PEDIATRIC BOTTLE Blood Culture adequate volume  Final   Culture   Final    NO GROWTH 3  DAYS Performed at [facility], [address], [place], [place] [address]    Report Status PENDING  Incomplete     Labs: Basic Metabolic Panel: Recent Labs  Lab 06/14/17 2023 06/16/17 0331 06/17/17 0357  NA 135 138 140  K 3.3* 3.1* 4.2  CL 102 106 103  CO2 22 23 27   GLUCOSE 112* 144* 110*  BUN 9 <5* <5*  CREATININE 0.87 0.76 0.66  CALCIUM 8.5* 8.1* 9.0   Liver Function Tests: Recent Labs  Lab 06/14/17 2023  AST 31  ALT 36  ALKPHOS 51  BILITOT 0.4  PROT 6.7  ALBUMIN 3.4*   No results for input(s): LIPASE, AMYLASE in the last 168 hours. No results for input(s): AMMONIA in the last 168 hours. CBC: Recent Labs  Lab 06/14/17 2023 06/16/17 0331 06/17/17 0357  WBC 5.0 5.8 5.4  NEUTROABS 2.3  --   --   HGB 15.9* 13.5 13.2  HCT 45.7 40.2 39.1  MCV 89.1  89.9 89.9  PLT 93* 115* 151   Cardiac Enzymes: No results for input(s): CKTOTAL, CKMB, CKMBINDEX, TROPONINI in the last 168 hours. BNP: BNP (last 3 results) No results for input(s): BNP in the last 8760 hours.  ProBNP (last 3 results) No results for input(s): PROBNP in the last 8760 hours.  CBG: No results for input(s): GLUCAP in the last 168 hours.     Signed:  Laverna Peace, MD [facility] 06/17/2017, 2:48 PM

## 2017-06-17 NOTE — Progress Notes (Signed)
Patient ambulated down the halls; greater then 200 feet wearing continuous pulse ox meter; she maintained her 02 Sats. Between 95-96%. Denies shortness of breath, reports exertion.

## 2017-06-17 NOTE — Progress Notes (Signed)
Patient ready for discharge to home; discharge instructions given and reviewed; Rx's sent electronically. Patient discharged out via wheelchair.

## 2017-06-19 LAB — CULTURE, BLOOD (ROUTINE X 2)
CULTURE: NO GROWTH
Culture: NO GROWTH
SPECIAL REQUESTS: ADEQUATE
SPECIAL REQUESTS: ADEQUATE

## 2017-06-23 ENCOUNTER — Encounter: Payer: Self-pay | Admitting: Neurology

## 2017-06-23 ENCOUNTER — Ambulatory Visit: Payer: Managed Care, Other (non HMO) | Admitting: Neurology

## 2017-06-23 VITALS — BP 118/72 | HR 77 | Ht 67.0 in | Wt 217.0 lb

## 2017-06-23 DIAGNOSIS — R269 Unspecified abnormalities of gait and mobility: Secondary | ICD-10-CM

## 2017-06-23 DIAGNOSIS — J189 Pneumonia, unspecified organism: Secondary | ICD-10-CM | POA: Diagnosis not present

## 2017-06-23 DIAGNOSIS — R5383 Other fatigue: Secondary | ICD-10-CM | POA: Diagnosis not present

## 2017-06-23 DIAGNOSIS — G35 Multiple sclerosis: Secondary | ICD-10-CM | POA: Diagnosis not present

## 2017-06-23 NOTE — Progress Notes (Signed)
[facility]  PATIENT: Brittney Harrison DOB: 09-Apr-1981  REFERRING DOCTOR OR PCP:  Farris Has (PCP); [facility] [phone] Neuro) SOURCE: Patient, notes from Dr. Renne Crigler, imaging and lab results, MRI images on PACS CD  _________________________________   HISTORICAL  CHIEF COMPLAINT:  Chief Complaint  Patient presents with  . Multiple Sclerosis    Room 12. Ocrevus was not approved by insurance.  She would like to discuss other treatment options.  Reports recent ED visit for Pneumonia.  She is finishing up a Levaquin prescription and starting to feel better.    HISTORY OF PRESENT ILLNESS:  Brittney Harrison Is a 37 year old woman diagnosed with multiple sclerosis in 2014.   Update 06/23/2017: [employer] was not approved by her insurance.  Yesterday, the insurance company was called to find out what options would be approved.  They cover all 3 of the pills and Copaxone.  In the past she wason COpaxone and had a lot of skin reactions and does not think she could do it again.     She does note some new facial numbness that is bilateral over the last few months -- it is fluctuating x a few hours.   She also had a few rounds of vertigo x one hour.    Other aspects of her MS are stable.  Specifically she notes no changes in her gait or strength.  She has some urinary frequency with rare incontinence.  She continues to have some fatigue.  She was hospitalized x 4 days for pneumonia, given IV antibiotics and is noe on Levaquin.     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    Prednisone seemed to be helping but is not good long-term.    Methotrexate is being considered    Update 01/23/2017:    She has not been on any DMT and her insurance company has not yet approved Ocrevus for her.  She has had more hand numbness and visual changes since the  last visit.  Her left eye got painful and she had more black spots while driving to [facility].   She had optic neuritis in the past.    She felt vision was back to normal after a few hours.   She also had right finger numbness for 4 days.      Bladder is doing the same.     The brain MRI 12/09/2016 and was stable compared to her previous one a few years earlier.     She has a lot of trouble with fatigue. She has both physical and mental fatigue.  She gets occasional waves of lassitude, usually in the late afternoons.    She sleeps well.   She has noted more stress but this is better than a few weeks ago.   She has noted more trouble with recall.    She gets some unusual spell of distorted time perception while moving where she feels her brian is moving fast but she is moving slow.   This may last x 5-10 minutes and there will be several spells in the same day.  This occurs once a month.    _______________________________________ From 10/13/2016:  In May 2002, she had thoracic dysesthesias.   In 2003, while at college, she had severe fatigue and droppey d out.    In 2004, she had a episode of right leg numbness x  3 weeks.   It built up over a period of one day.   She had no insurance and improved so did not seek medical opinion.   In 2005, she had more right leg issues and in 2006 had right hand numbness.    Fatigue was also worse.   She also began to note some milder cognitive issues in 2006 that worsened in 2008.  She had severe left leg cramping in 2007 associated with some weakness for a while.  She also noted right facial symptoms in 2008.     In October 2014, she had the onset of black spots in her vision.  An MRI was performed worrisome for MS and an LP showed CSF consistent with MS.   She also had more urinary incontinence around that time.    Initially, she saw Dr. Clarisse Gouge (who also saw her sister with IIH).   He referred her to Dr. Renne Crigler who started her on Copaxone.   She stopped Copaxone a year later for  pregnancy and did not restart after delivery June 2016 due to needle fatigue.    Gait/strength/sensation:  She occasionally feels off balanced but has no recent falls and feels gait is usually fine.    She has a tight sensation in her toes bilaterally, left worse than right.  She denies weakness.   She has had 'zingers' in her neck since 2010 with flexion with symptoms going down the right arm.      Vision:   She notes visual blurring out of the left eye. She still sees black spots in her vision.   Color vision is not affected.  She denies diplopia.    Bladder:  She has had stress incontinence since her first pregnancy.   She has had some spells of incontinence in 2009 before any pregnancy and also had some last year.   Fatigue/sleep:  She is tired daily since 2008, regardless of the duration or quality of her sleep.   Some days seem worse.   Fatigue is worse with heat and as the day goes on.   She tried Ritalin which helped her brian fog more than the fatigue.   She sleeps 6-8 hours nightly depending on her kids.  She has mild sleep onset insomnia but once asleep she stays asleep.  Cognitive:   She reports a mental fog since 2005.   She notes worsening word finding issues and decreased focus and some forgetfulness.  Mood:    She reports some depression since December that seemed to come out of nowhere.   She feels better this month than a few months ago but not to baseline.    She has had anxiety x years and feels this is stable.   She worries a lot, in general.     FH:   Two distant cousins have MS.     Two family members has had ALS a swell and several have fibromyalgia.  Her 76 yo grandmoither has dementia.    I personally reviewed the MRIs of the brain dated 08/15/2013 and 11/12/2014 and the MRI of the cervical spine 02/21/2013. The MRIs of the brain show a stable pattern of T2/FLAIR hyperintense foci in the periventricular, juxtacortical and deep white matter consistent with MS. There is also a  focus in the right cerebral peduncle. The brain stem appear normal. None of the foci enhanced. The MRI of the cervical spine shows a small posterior focus adjacent to C5-C6.  I also reviewed laboratory and studies  from 2014. Visual, brain stem and somatosensory evoked responses were normal. According to a note from Dr. Renne Crigler, CSF was abnormal showing greater than 5 oligoclonal bands.  REVIEW OF SYSTEMS: Constitutional: No fevers, chills, sweats, or change in appetite.  She notes fatigue. Eyes: No visual changes, double vision, eye pain Ear, nose and throat: No hearing loss, ear pain, nasal congestion, sore throat Cardiovascular: No chest pain, palpitations Respiratory: No shortness of breath at rest or with exertion.   No wheezes GastrointestinaI: No nausea, vomiting, diarrhea, abdominal pain, fecal incontinence Genitourinary: No dysuria, urinary retention or frequency.  No nocturia. Musculoskeletal: No neck pain, back pain Integumentary: No rash, pruritus, skin lesions Neurological: as above Psychiatric: No depression at this time.  No anxiety Endocrine: No palpitations, diaphoresis, change in appetite, change in weigh or increased thirst Hematologic/Lymphatic: No anemia, purpura, petechiae. Allergic/Immunologic: No itchy/runny eyes, nasal congestion, recent allergic reactions, rashes  ALLERGIES: Allergies  Allergen Reactions  . Codeine Nausea And Vomiting  . Eye-Sed Ophthalmic [Zinc] Itching    Eye gel   . Percocet [Oxycodone-Acetaminophen] Nausea And Vomiting    HOME MEDICATIONS:  Current Outpatient Medications:  .  benzonatate (TESSALON) 100 MG capsule, Take 1 capsule (100 mg total) by mouth 2 (two) times daily as needed for cough., Disp: 20 capsule, Rfl: 0 .  cetirizine (ZYRTEC) 10 MG tablet, Take 10 mg by mouth daily., Disp: , Rfl:  .  Cholecalciferol (VITAMIN D PO), Take 1 tablet by mouth daily., Disp: , Rfl:  .  levofloxacin (LEVAQUIN) 750 MG tablet, Take 1 tablet (750  mg total) by mouth daily for 7 days., Disp: 7 tablet, Rfl: 0 .  predniSONE (DELTASONE) 20 MG tablet, Take 20 mg by mouth daily with breakfast., Disp: , Rfl:   PAST MEDICAL HISTORY: Past Medical History:  Diagnosis Date  . Abnormal Pap smear 2007   CIN-1  . 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 Asthma   . 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 Clitoral irritation 07/2006  . Complication of anesthesia    severe vomiting  . Fatigue   . Fibrocystic breast 2007  . Fullness of breast 01/2007   right  . GERD (gastroesophageal reflux disease)   . H/O seasonal allergies   . H/O varicella   . Headache(784.0)    migraines   . Hx: UTI (urinary tract infection)   . Increased BMI   . Irregular bleeding 10/2005  . Multiple sclerosis (HCC)   . Thyroid disease    Fluxuate between hyper to hypo  . 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 Vision abnormalities   . Yeast vaginitis 07/2006    PAST SURGICAL HISTORY: Past Surgical History:  Procedure Laterality Date  . CHOLECYSTECTOMY  09/11/91  . DILATION AND CURETTAGE OF UTERUS  2011   endometritis  . DILATION AND CURETTAGE OF UTERUS  11/19/2011   Procedure: DILATATION AND CURETTAGE;  Surgeon: Hal Morales, MD;  Location: [facility];  Service: Gynecology;  Laterality: N/A;  dilitation and currettage with repair of intraoperative cervical laceration.  . TONSILLECTOMY  10/28/97  . WISDOM TOOTH EXTRACTION  2001    FAMILY HISTORY: Family History  Problem Relation Age of Onset  . Heart disease Father   . Alcohol abuse Father   . Melanoma Father   . Cancer Maternal Aunt        Thyroid & breast  . Cancer Paternal Uncle        Esophageal  . Diabetes Maternal Grandmother   .  Cancer Maternal Grandmother        Kidney  . Cancer Paternal Grandmother   . Hypothyroidism Mother   . Multiple sclerosis Cousin     SOCIAL HISTORY:  Social History   Socioeconomic History  . Marital status: Married    Spouse name: Not on file  . Number of  children: Not on file  . Years of education: Not on file  . Highest education level: Not on file  Social Needs  . Financial resource strain: Not on file  . Food insecurity - worry: Not on file  . Food insecurity - inability: Not on file  . Transportation needs - medical: Not on file  . Transportation needs - non-medical: Not on file  Occupational History  . Not on file  Tobacco Use  . Smoking status: Former Smoker    Packs/day: 1.00    Years: 10.00    Pack years: 10.00    Types: Cigarettes    Last attempt to quit: 02/13/2011    Years since quitting: 6.3  . Smokeless tobacco: Never Used  Substance and Sexual Activity  . Alcohol use: No  . Drug use: No  . Sexual activity: Not Currently  Other Topics Concern  . Not on file  Social History Narrative  . Not on file     PHYSICAL EXAM  Vitals:   06/23/17 1039  BP: 118/72  Pulse: 77  Weight: 217 lb (98.4 kg)  Height: 5\' 7"  (1.702 m)    Body mass index is 33.99 kg/m.   General: The patient is well-developed and well-nourished and in no acute distress   Neurologic Exam  Mental status: The patient is alert and oriented x 3 at the time of the examination. The patient has apparent normal recent and remote memory, with an apparently normal attention span and concentration ability.   Speech is normal.  Cranial nerves: Extraocular movements are full. Pupils are equal, round, and reactive to light and accomodation.  Visual fields are full.  Color vision is now symmetric. .   Facial symmetry is present. There is good facial sensation to soft touch bilaterally.Facial strength is normal.  Trapezius and sternocleidomastoid strength is normal. No dysarthria is noted.  The tongue is midline, and the patient has symmetric elevation of the soft palate. No obvious hearing deficits are noted.  Motor:  Muscle bulk is normal.   Muscle tone is normal her strength is 5/5 in the arms and legs.  Ies.   Sensory: She has intact touch sensation in  the arms or legs.     Coordination: Cerebellar testing reveals good finger-nose-finger and heel-to-shin bilaterally.  Gait and station: Station is normal.   Her gait is normal.  The tandem gait is wide..   Reflexes: Deep tendon reflexes are normal in the arms but increased at knees and ankles, left > right.    No ankle clonus      DIAGNOSTIC DATA (LABS, IMAGING, TESTING) - I reviewed patient records, labs, notes, testing and imaging myself where available.  Lab Results  Component Value Date   WBC 5.4 06/17/2017   HGB 13.2 06/17/2017   HCT 39.1 06/17/2017   MCV 89.9 06/17/2017   PLT 151 06/17/2017        ASSESSMENT AND PLAN   Multiple sclerosis (HCC)  Gait disturbance  Other fatigue   1.    We discussed disease modifying therapies for MS, especially in light of her recent infection and her unclear rheumatologic status.  She might have  Wegener's granulomatosis or another process.  I saw one study where leflunomide had benefit in Wegener's granulomatosis so Aubagio would be the same mechanism of action and might be her best option.  Tecfidera might also be of benefit.  She does not want to go back to Copaxone due to tolerability issues.  I would be uncomfortable putting her on Gilenya due to possible additive effects of amino suppression and higher risk of infection.  Other medications for MS are not covered by her insurance. 2.    Continue to be active and exercise as tolerated. 3.   She will return to see me in 4-5 months or sooner if there are new or worsening neurologic symptoms.  Makynzee Tigges A. Epimenio Foot, MD, [facility] 06/23/2017, 3:41 PM Certified in Neurology, Clinical Neurophysiology, Sleep Medicine, Pain Medicine and Neuroimaging  [facility] [address], [address] [place], [place] [address] [phone]

## 2017-06-27 ENCOUNTER — Telehealth: Payer: Self-pay | Admitting: Neurology

## 2017-06-27 ENCOUNTER — Encounter: Payer: Self-pay | Admitting: *Deleted

## 2017-06-27 NOTE — Telephone Encounter (Signed)
I spoke to Dr. Dierdre Forth about Brittney Harrison to go over some of our drug options for her MS and also for her symptoms that he is treating her for.  [name] is similar to [name] and should be able to control her MS and also take the place of her low-dose methotrexate.   We will not Let [name] know to stop the methotrexate when she starts the Aubagio.  The LFT and CBC will be checked monthly for 5 - 6 months.

## 2017-07-03 ENCOUNTER — Telehealth: Payer: Self-pay | Admitting: *Deleted

## 2017-07-03 NOTE — Telephone Encounter (Signed)
PA for Aubagio 14mg  #28/28 completed via CoverMyMeds.  Key: [id number]. Dx: RRMS (G35). Tried and failed meds: Copaxone./fim

## 2017-07-04 NOTE — Telephone Encounter (Signed)
Fax received from [facility], phone# [phone]).  Aubagio approved for dates 06/03/17 thru 07/03/18.  Case ID: [id number]./BEE

## 2017-09-08 ENCOUNTER — Ambulatory Visit: Payer: Managed Care, Other (non HMO) | Admitting: Endocrinology

## 2017-09-08 ENCOUNTER — Encounter: Payer: Self-pay | Admitting: Endocrinology

## 2017-09-08 VITALS — BP 122/80 | HR 92 | Ht 67.25 in | Wt 198.0 lb

## 2017-09-08 DIAGNOSIS — E0590 Thyrotoxicosis, unspecified without thyrotoxic crisis or storm: Secondary | ICD-10-CM

## 2017-09-08 MED ORDER — METHIMAZOLE 5 MG PO TABS: 5.0000 mg | ORAL_TABLET | Freq: Three times a day (TID) | ORAL | 11 refills | Status: DC

## 2017-09-08 NOTE — Progress Notes (Signed)
Subjective:    Patient ID: Brittney Harrison, female    DOB: January 03, 1981, 37 y.o.   MRN: [id number]  HPI Pt is referred by Dr Kateri Plummer, for hyperthyroidism.  Pt reports he was dx'ed with thyromegaly in 2007.  she has never been on therapy for this.  she has never had XRT to the anterior neck, or thyroid surgery.  she does not consume kelp or any non-prescribed thyroid medication.  she has never been on amiodarone.  She has a Mirena device.  She has moderate fatigue, and assoc anxiety.   Past Medical History:  Diagnosis Date  . Abnormal Pap smear 2007   CIN-1  . 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 Asthma   . 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 Clitoral irritation 07/2006  . Complication of anesthesia    severe vomiting  . Fatigue   . Fibrocystic breast 2007  . Fullness of breast 01/2007   right  . GERD (gastroesophageal reflux disease)   . H/O seasonal allergies   . H/O varicella   . Headache(784.0)    migraines   . Hx: UTI (urinary tract infection)   . Increased BMI   . Irregular bleeding 10/2005  . Multiple sclerosis (HCC)   . Thyroid disease    Fluxuate between hyper to hypo  . 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 Vision abnormalities   . Yeast vaginitis 07/2006    Past Surgical History:  Procedure Laterality Date  . CHOLECYSTECTOMY  09/11/91  . DILATION AND CURETTAGE OF UTERUS  2011   endometritis  . DILATION AND CURETTAGE OF UTERUS  11/19/2011   Procedure: DILATATION AND CURETTAGE;  Surgeon: Hal Morales, MD;  Location: [facility];  Service: Gynecology;  Laterality: N/A;  dilitation and currettage with repair of intraoperative cervical laceration.  . TONSILLECTOMY  10/28/97  . WISDOM TOOTH EXTRACTION  2001    Social History   Socioeconomic History  . Marital status: Married    Spouse name: Not on file  . Number of children: Not on file  . Years of education: Not on file  . Highest education level: Not on file  Occupational History  . Not on file  Social  Needs  . Financial resource strain: Not on file  . Food insecurity:    Worry: Not on file    Inability: Not on file  . Transportation needs:    Medical: Not on file    Non-medical: Not on file  Tobacco Use  . Smoking status: Former Smoker    Packs/day: 1.00    Years: 10.00    Pack years: 10.00    Types: Cigarettes    Last attempt to quit: 02/13/2011    Years since quitting: 6.5  . Smokeless tobacco: Never Used  Substance and Sexual Activity  . Alcohol use: No  . Drug use: No  . Sexual activity: Not Currently  Lifestyle  . Physical activity:    Days per week: Not on file    Minutes per session: Not on file  . Stress: Not on file  Relationships  . Social connections:    Talks on phone: Not on file    Gets together: Not on file    Attends religious service: Not on file    Active member of club or organization: Not on file    Attends meetings of clubs or organizations: Not on file    Relationship status: Not on file  . Intimate partner violence:    Fear  of current or ex partner: Not on file    Emotionally abused: Not on file    Physically abused: Not on file    Forced sexual activity: Not on file  Other Topics Concern  . Not on file  Social History Narrative  . Not on file    Current Outpatient Medications on File Prior to Visit  Medication Sig Dispense Refill  . albuterol (PROAIR HFA) 108 (90 Base) MCG/ACT inhaler as needed.    . cetirizine (ZYRTEC) 10 MG tablet Take 10 mg by mouth daily.    . Cholecalciferol (VITAMIN D PO) Take 1 tablet by mouth daily.    [name] escitalopram (LEXAPRO) 10 MG tablet Take 10 mg by mouth daily.  0  . hydrOXYzine (ATARAX/VISTARIL) 50 MG tablet as needed.  1  . Teriflunomide (AUBAGIO) 14 MG TABS Take 14 mg by mouth daily.     No current facility-administered medications on file prior to visit.     Allergies  Allergen Reactions  . Codeine Nausea And Vomiting  . Eye-Sed Ophthalmic [Zinc] Itching    Eye gel   . Percocet  [Oxycodone-Acetaminophen] Nausea And Vomiting    Family History  Problem Relation Age of Onset  . Heart disease Father   . Alcohol abuse Father   . Melanoma Father   . Cancer Maternal Aunt        Thyroid & breast  . Cancer Paternal Uncle        Esophageal  . Diabetes Maternal Grandmother   . Cancer Maternal Grandmother        Kidney  . Cancer Paternal Grandmother   . Hypothyroidism Mother   . Goiter Mother   . Multiple sclerosis Cousin     BP 122/80 (BP Location: Left Arm, Patient Position: Sitting, Cuff Size: Normal)   Pulse 92   Ht 5' 7.25" (1.708 m)   Wt 198 lb (89.8 kg)   SpO2 97%   BMI 30.78 kg/m     Review of Systems denies headache, hoarseness, visual loss, palpitations, sob, polyuria, muscle weakness, edema, excessive diaphoresis, tremor, and rhinorrhea. She has lost 47 lbs x 5 months. She has intermitt diarrhea, easy bruising, and heat intolerance.     Objective:   Physical Exam VS: see vs page GEN: no distress HEAD: head: no deformity eyes: no periorbital swelling, no proptosis external nose and ears are normal mouth: no lesion seen NECK: supple, thyroid is not enlarged CHEST WALL: no deformity LUNGS: clear to auscultation CV: reg rate and rhythm, no murmur ABD: abdomen is soft, nontender.  no hepatosplenomegaly.  not distended.  no hernia MUSCULOSKELETAL: muscle bulk and strength are grossly normal.  no obvious joint swelling.  gait is normal and steady EXTEMITIES: no deformity.  no edema PULSES: no carotid bruit NEURO:  cn 2-12 grossly intact.   readily moves all 4's.  sensation is intact to touch on all 4's.  No tremor SKIN:  Normal texture and temperature.  No rash or suspicious lesion is visible.  Not diaphoretic NODES:  None palpable at the neck PSYCH: alert, well-oriented.  Does not appear anxious nor depressed.    [place] (2007): Thyroid gland within upper limits of normal.  Only a single 3 mm cystic nodule is noted in the lower pole on the left.     outside test results are reviewed: TSH=0.13  I have reviewed outside records, and summarized: Pt was noted to have elevated a1c, and referred here.  She was recently in the hospital with pneumonia, but  was clinically better.  She was rx'ed prednisone by rheumatology.      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   Patient Instructions  blood tests are requested for you today.  We'll let you know about the results. I have sent a prescription to your pharmacy, to slow the thyroid.   If ever you have fever while taking methimazole, stop it and call us, even if the reason is obvious, because of the risk of a rare side-effect.  Please come back for a follow-up appointment in 1 month.

## 2017-09-08 NOTE — Patient Instructions (Signed)
blood tests are requested for you today.  We'll let you know about the results. I have sent a prescription to your pharmacy, to slow the thyroid.   If ever you have fever while taking methimazole, stop it and call us, even if the reason is obvious, because of the risk of a rare side-effect.  Please come back for a follow-up appointment in 1 month.

## 2017-09-10 LAB — THYROID STIMULATING IMMUNOGLOBULIN

## 2017-09-14 ENCOUNTER — Encounter (HOSPITAL_COMMUNITY): Payer: Self-pay | Admitting: Psychiatry

## 2017-09-14 ENCOUNTER — Ambulatory Visit (HOSPITAL_COMMUNITY): Payer: 59 | Admitting: Psychiatry

## 2017-09-14 VITALS — BP 112/78 | HR 95 | Ht 67.0 in | Wt 199.0 lb

## 2017-09-14 DIAGNOSIS — F902 Attention-deficit hyperactivity disorder, combined type: Secondary | ICD-10-CM | POA: Diagnosis not present

## 2017-09-14 DIAGNOSIS — Z62811 Personal history of psychological abuse in childhood: Secondary | ICD-10-CM | POA: Diagnosis not present

## 2017-09-14 DIAGNOSIS — F411 Generalized anxiety disorder: Secondary | ICD-10-CM

## 2017-09-14 DIAGNOSIS — Z62810 Personal history of physical and sexual abuse in childhood: Secondary | ICD-10-CM

## 2017-09-14 DIAGNOSIS — R5382 Chronic fatigue, unspecified: Secondary | ICD-10-CM | POA: Diagnosis not present

## 2017-09-14 DIAGNOSIS — G4710 Hypersomnia, unspecified: Secondary | ICD-10-CM | POA: Diagnosis not present

## 2017-09-14 DIAGNOSIS — Z811 Family history of alcohol abuse and dependence: Secondary | ICD-10-CM | POA: Diagnosis not present

## 2017-09-14 DIAGNOSIS — Z62898 Other specified problems related to upbringing: Secondary | ICD-10-CM | POA: Diagnosis not present

## 2017-09-14 DIAGNOSIS — F17210 Nicotine dependence, cigarettes, uncomplicated: Secondary | ICD-10-CM | POA: Diagnosis not present

## 2017-09-14 MED ORDER — AMPHETAMINE-DEXTROAMPHETAMINE 10 MG PO TABS: 10.0000 mg | ORAL_TABLET | Freq: Two times a day (BID) | ORAL | 0 refills | Status: DC

## 2017-09-14 MED ORDER — ESCITALOPRAM OXALATE 20 MG PO TABS: 20.0000 mg | ORAL_TABLET | Freq: Every day | ORAL | 0 refills | Status: DC
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
## 2017-10-10 ENCOUNTER — Ambulatory Visit: Payer: Managed Care, Other (non HMO) | Admitting: Endocrinology

## 2017-10-10 ENCOUNTER — Encounter: Payer: Self-pay | Admitting: Endocrinology

## 2017-10-10 VITALS — BP 148/82 | HR 86 | Wt 196.2 lb

## 2017-10-10 DIAGNOSIS — E0590 Thyrotoxicosis, unspecified without thyrotoxic crisis or storm: Secondary | ICD-10-CM

## 2017-10-10 LAB — TSH: TSH: 0.59 u[IU]/mL (ref 0.35–4.50)

## 2017-10-10 LAB — T4, FREE: Free T4: 0.73 ng/dL (ref 0.60–1.60)

## 2017-10-10 MED ORDER — METHIMAZOLE 5 MG PO TABS: 5.0000 mg | ORAL_TABLET | Freq: Every day | ORAL | 11 refills | Status: DC

## 2017-10-10 NOTE — Patient Instructions (Signed)
blood tests are requested for you today.  We'll let you know about the results. Based on the results, i'll decide on an amount of methimazole you should take, and send the prescription.  If ever you have fever while taking methimazole, stop it and call us, even if the reason is obvious, because of the risk of a rare side-effect. Please come back for a follow-up appointment in 3 months.

## 2017-10-10 NOTE — Progress Notes (Signed)
Subjective:    Patient ID: Brittney Harrison, female    DOB: October 18, 1980, 37 y.o.   MRN: [id number]  HPI Pt returns for f/u of hyperthyroidism (pt was dx'ed with thyromegaly in 2007; she has a Mirena device; she chose tapazole rx; [place] (2007) showed thyroid gland within upper limits of normal, with a single 3 mm cystic nodule).  She takes tapazole 5 mg BID.  pt states she feels well in general.   Past Medical History:  Diagnosis Date  . Abnormal Pap smear 2007   CIN-1  . 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 Asthma   . 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 Clitoral irritation 07/2006  . Complication of anesthesia    severe vomiting  . Fatigue   . Fibrocystic breast 2007  . Fullness of breast 01/2007   right  . GERD (gastroesophageal reflux disease)   . H/O seasonal allergies   . H/O varicella   . Headache(784.0)    migraines   . Hx: UTI (urinary tract infection)   . Increased BMI   . Irregular bleeding 10/2005  . Multiple sclerosis ([facility])   . Thyroid disease    Fluxuate between hyper to hypo  . 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 Vision abnormalities   . Yeast vaginitis 07/2006    Past Surgical History:  Procedure Laterality Date  . CHOLECYSTECTOMY  09/11/91  . DILATION AND CURETTAGE OF UTERUS  2011   endometritis  . DILATION AND CURETTAGE OF UTERUS  11/19/2011   Procedure: DILATATION AND CURETTAGE;  Surgeon: Hal Morales, MD;  Location: [facility];  Service: Gynecology;  Laterality: N/A;  dilitation and currettage with repair of intraoperative cervical laceration.  . TONSILLECTOMY  10/28/97  . WISDOM TOOTH EXTRACTION  2001    Social History   Socioeconomic History  . Marital status: Married    Spouse name: Not on file  . Number of children: Not on file  . Years of education: Not on file  . Highest education level: Not on file  Occupational History  . Not on file  Social Needs  . Financial resource strain: Not on file  . Food insecurity:    Worry: Not on file   Inability: Not on file  . Transportation needs:    Medical: Not on file    Non-medical: Not on file  Tobacco Use  . Smoking status: Current Every Day Smoker    Packs/day: 1.50    Years: 10.00    Pack years: 15.00    Types: Cigarettes    Last attempt to quit: 02/13/2011    Years since quitting: 6.6  . Smokeless tobacco: Never Used  . Tobacco comment: Has quit 3 times this year but reports due to anxiety now  Substance and Sexual Activity  . Alcohol use: Yes    Alcohol/week: 1.2 oz    Types: 2 Cans of beer per week  . Drug use: No  . Sexual activity: Yes    Partners: Male    Birth control/protection: IUD  Lifestyle  . Physical activity:    Days per week: Not on file    Minutes per session: Not on file  . Stress: Not on file  Relationships  . Social connections:    Talks on phone: Not on file    Gets together: Not on file    Attends religious service: Not on file    Active member of club or organization: Not on file    Attends meetings  of clubs or organizations: Not on file    Relationship status: Not on file  . Intimate partner violence:    Fear of current or ex partner: Not on file    Emotionally abused: Not on file    Physically abused: Not on file    Forced sexual activity: Not on file  Other Topics Concern  . Not on file  Social History Narrative  . Not on file    Current Outpatient Medications on File Prior to Visit  Medication Sig Dispense Refill  . albuterol (PROAIR HFA) 108 (90 Base) MCG/ACT inhaler as needed.    [name] amphetamine-dextroamphetamine (ADDERALL) 10 MG tablet Take 1 tablet (10 mg total) by mouth 2 (two) times daily with a meal. 60 tablet 0  . cetirizine (ZYRTEC) 10 MG tablet Take 10 mg by mouth daily.    . Cholecalciferol (VITAMIN D PO) Take 1 tablet by mouth daily.    [name] escitalopram (LEXAPRO) 20 MG tablet Take 1 tablet (20 mg total) by mouth daily. 90 tablet 0  . hydrOXYzine (ATARAX/VISTARIL) 50 MG tablet as needed.  1  . Teriflunomide (AUBAGIO) 14  MG TABS Take 14 mg by mouth daily.     No current facility-administered medications on file prior to visit.     Allergies  Allergen Reactions  . Codeine Nausea And Vomiting  . Eye-Sed Ophthalmic [Zinc] Itching    Eye gel   . Percocet [Oxycodone-Acetaminophen] Nausea And Vomiting    Family History  Problem Relation Age of Onset  . Heart disease Father   . Alcohol abuse Father   . Melanoma Father   . Cancer Maternal Aunt        Thyroid & breast  . Cancer Paternal Uncle        Esophageal  . Diabetes Maternal Grandmother   . Cancer Maternal Grandmother        Kidney  . Cancer Paternal Grandmother   . Hypothyroidism Mother   . Goiter Mother   . Multiple sclerosis Cousin     BP (!) 148/82 (BP Location: Left Arm, Patient Position: Sitting, Cuff Size: Normal)   Pulse 86   Wt 196 lb 3.2 oz (89 kg)   SpO2 97%   BMI 30.73 kg/m    Review of Systems Denies fever    Objective:   Physical Exam VITAL SIGNS:  See vs page GENERAL: no distress NECK: thyroid is slightly and diffusely enlarged.  No thyroid nodule is palpable.  No palpable lymphadenopathy at the anterior neck.   Lab Results  Component Value Date   TSH 0.59 10/10/2017      Assessment & Plan:  Hyperthyroidism: improved control.  Reduce tapazole to 5 mg qd. Thyroid cyst: no f/u needed, except for periodic PE of the neck Please come back for a follow-up appointment in 3 months

## 2017-10-17 ENCOUNTER — Ambulatory Visit (HOSPITAL_COMMUNITY): Payer: Self-pay | Admitting: Psychiatry

## 2017-10-30 ENCOUNTER — Encounter (HOSPITAL_COMMUNITY): Payer: Self-pay | Admitting: Psychiatry

## 2017-10-30 ENCOUNTER — Ambulatory Visit (HOSPITAL_COMMUNITY): Payer: 59 | Admitting: Psychiatry

## 2017-10-30 VITALS — BP 135/94 | HR 90 | Ht 67.0 in | Wt 192.0 lb

## 2017-10-30 DIAGNOSIS — Z62811 Personal history of psychological abuse in childhood: Secondary | ICD-10-CM

## 2017-10-30 DIAGNOSIS — Z62810 Personal history of physical and sexual abuse in childhood: Secondary | ICD-10-CM

## 2017-10-30 DIAGNOSIS — F411 Generalized anxiety disorder: Secondary | ICD-10-CM | POA: Diagnosis not present

## 2017-10-30 DIAGNOSIS — F902 Attention-deficit hyperactivity disorder, combined type: Secondary | ICD-10-CM

## 2017-10-30 DIAGNOSIS — Z811 Family history of alcohol abuse and dependence: Secondary | ICD-10-CM

## 2017-10-30 DIAGNOSIS — G4710 Hypersomnia, unspecified: Secondary | ICD-10-CM

## 2017-10-30 DIAGNOSIS — R5382 Chronic fatigue, unspecified: Secondary | ICD-10-CM | POA: Diagnosis not present

## 2017-10-30 DIAGNOSIS — F17210 Nicotine dependence, cigarettes, uncomplicated: Secondary | ICD-10-CM

## 2017-10-30 DIAGNOSIS — Z62898 Other specified problems related to upbringing: Secondary | ICD-10-CM

## 2017-10-30 MED ORDER — AMPHETAMINE-DEXTROAMPHET ER 30 MG PO CP24
30.0000 mg | ORAL_CAPSULE | Freq: Every day | ORAL | 0 refills | Status: DC
Start: 2017-10-30 — End: 2017-11-28

## 2017-10-30 NOTE — Progress Notes (Signed)
BH MD/PA/NP OP Progress Note  10/30/2017 12:29 PM Brittney Harrison  MRN:  [id number]  Chief Complaint:  Adderall seems to be helping  HPI: Brittney Harrison presents for a med management follow-up.  She continues to struggle with some marital stressors and we spent time processing some of her boundaries and expectations in the marriage.  Reviewed the effects of Lexapro and Adderall, and the Lexapro has been quite helpful for reducing her anxiety and panic sensations.   she feels like the Adderall has also been well tolerated and she is now able to focus on conversations, and remain a bit more goal-directed in her conversations with others.  She is able to read books and enjoy them because she is paying attention to the books.  She reports her fatigue continues to be an issue and she has had some challenges getting her sleep study approved for an in lab study.  We agreed to increase Adderall to 30 mg extended release, and to follow-up in 1 month to see if this is providing her some additional benefit.    I spent time with her reiterating the importance of therapy and processing some of the grief she feels in the context of marital stressors.  Visit Diagnosis:    ICD-10-CM   1. Attention deficit hyperactivity disorder (ADHD), combined type F90.2 amphetamine-dextroamphetamine (ADDERALL XR) 30 MG 24 hr capsule  2. Chronic fatigue R53.82 amphetamine-dextroamphetamine (ADDERALL XR) 30 MG 24 hr capsule    Past Psychiatric History: See intake H&P for full details. Reviewed, with no updates at this time.  Past Medical History:  Past Medical History:  Diagnosis Date  . Abnormal Pap smear 2007   CIN-1  . 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 Asthma   . 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 Clitoral irritation 07/2006  . Complication of anesthesia    severe vomiting  . Fatigue   . Fibrocystic breast 2007  . Fullness of breast 01/2007   right  . GERD (gastroesophageal reflux disease)    . H/O seasonal allergies   . H/O varicella   . Headache(784.0)    migraines   . Hx: UTI (urinary tract infection)   . Increased BMI   . Irregular bleeding 10/2005  . Multiple sclerosis (HCC)   . Thyroid disease    Fluxuate between hyper to hypo  . 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 Vision abnormalities   . Yeast vaginitis 07/2006    Past Surgical History:  Procedure Laterality Date  . CHOLECYSTECTOMY  09/11/91  . DILATION AND CURETTAGE OF UTERUS  2011   endometritis  . DILATION AND CURETTAGE OF UTERUS  11/19/2011   Procedure: DILATATION AND CURETTAGE;  Surgeon: Hal Morales, MD;  Location: [facility];  Service: Gynecology;  Laterality: N/A;  dilitation and currettage with repair of intraoperative cervical laceration.  . TONSILLECTOMY  10/28/97  . WISDOM TOOTH EXTRACTION  2001    Family Psychiatric History: See intake H&P for full details. Reviewed, with no updates at this time.   Family History:  Family History  Problem Relation Age of Onset  . Heart disease Father   . Alcohol abuse Father   . Melanoma Father   . Cancer Maternal Aunt        Thyroid & breast  . Cancer Paternal Uncle        Esophageal  . Diabetes Maternal Grandmother   . Cancer Maternal Grandmother        Kidney  . Cancer Paternal Grandmother   .  Hypothyroidism Mother   . Goiter Mother   . Multiple sclerosis Cousin     Social History:  Social History   Socioeconomic History  . Marital status: Married    Spouse name: Not on file  . Number of children: Not on file  . Years of education: Not on file  . Highest education level: Not on file  Occupational History  . Not on file  Social Needs  . Financial resource strain: Not on file  . Food insecurity:    Worry: Not on file    Inability: Not on file  . Transportation needs:    Medical: Not on file    Non-medical: Not on file  Tobacco Use  . Smoking status: Current Every Day Smoker    Packs/day: 1.50    Years: 10.00    Pack years: 15.00    Types:  Cigarettes    Last attempt to quit: 02/13/2011    Years since quitting: 6.7  . Smokeless tobacco: Never Used  . Tobacco comment: Has quit 3 times this year but reports due to anxiety now  Substance and Sexual Activity  . Alcohol use: Yes    Alcohol/week: 1.2 oz    Types: 2 Cans of beer per week  . Drug use: No  . Sexual activity: Yes    Partners: Male    Birth control/protection: IUD  Lifestyle  . Physical activity:    Days per week: Not on file    Minutes per session: Not on file  . Stress: Not on file  Relationships  . Social connections:    Talks on phone: Not on file    Gets together: Not on file    Attends religious service: Not on file    Active member of club or organization: Not on file    Attends meetings of clubs or organizations: Not on file    Relationship status: Not on file  Other Topics Concern  . Not on file  Social History Narrative  . Not on file    Allergies:  Allergies  Allergen Reactions  . Codeine Nausea And Vomiting  . Eye-Sed Ophthalmic [Zinc] Itching    Eye gel   . Percocet [Oxycodone-Acetaminophen] Nausea And Vomiting    Metabolic Disorder Labs: No results found for: HGBA1C, MPG No results found for: PROLACTIN No results found for: CHOL, TRIG, HDL, CHOLHDL, VLDL, LDLCALC Lab Results  Component Value Date   TSH 0.59 10/10/2017   TSH 0.43 07/20/2011    Therapeutic Level Labs: No results found for: LITHIUM No results found for: VALPROATE No components found for:  CBMZ  Current Medications: Current Outpatient Medications  Medication Sig Dispense Refill  . albuterol (PROAIR HFA) 108 (90 Base) MCG/ACT inhaler as needed.    . Biotin 16109 MCG TABS Take 2 tablets by mouth.    . cetirizine (ZYRTEC) 10 MG tablet Take 10 mg by mouth daily.    . Cholecalciferol (VITAMIN D PO) Take 1 tablet by mouth daily.    [name] escitalopram (LEXAPRO) 20 MG tablet Take 1 tablet (20 mg total) by mouth daily. 90 tablet 0  . hydrOXYzine (ATARAX/VISTARIL) 50 MG  tablet as needed.  1  . methimazole (TAPAZOLE) 5 MG tablet Take 1 tablet (5 mg total) by mouth daily. 30 tablet 11  . Teriflunomide (AUBAGIO) 14 MG TABS Take 14 mg by mouth daily.    [name] amphetamine-dextroamphetamine (ADDERALL XR) 30 MG 24 hr capsule Take 1 capsule (30 mg total) by mouth daily. 30 capsule 0  No current facility-administered medications for this visit.      Musculoskeletal: Strength & Muscle Tone: within normal limits Gait & Station: normal Patient leans: N/A  Psychiatric Specialty Exam: ROS  Blood pressure (!) 135/94, pulse 90, height 5\' 7"  (1.702 m), weight 192 lb (87.1 kg), SpO2 95 %, currently breastfeeding.Body mass index is 30.07 kg/m.  General Appearance: Casual and Well Groomed  Eye Contact:  Good  Speech:  Clear and Coherent and Pressured  Volume:  Normal  Mood:  Anxious and upset about marrital stress  Affect:  Appropriate and Congruent  Thought Process:  Goal Directed and Descriptions of Associations: Intact  Orientation:  Full (Time, Place, and Person)  Thought Content: Logical   Suicidal Thoughts:  No  Homicidal Thoughts:  No  Memory:  Immediate;   Fair  Judgement:  Fair  Insight:  Fair  Psychomotor Activity:  Normal  Concentration:  Concentration: Fair  Recall:  Fair  Fund of Knowledge: Good  Language: Good  Akathisia:  Negative  Handed:  Right  AIMS (if indicated): na  Assets:  Communication Skills Desire for Improvement Financial Resources/Insurance Housing Social Support Transportation Vocational/Educational  ADL's:  Intact  Cognition: WNL  Sleep:  Good   Screenings: PHQ2-9     US OB FOLLOW UP ADD'L GEST from 08/29/2014 in THE [facility] [place] ULTRASOUND US OB FOLLOW UP ADD'L GEST from 08/15/2014 in THE [facility] [place] ULTRASOUND US OB FOLLOW UP ADD'L GEST from 08/08/2014 in THE [facility] [place] ULTRASOUND US OB FOLLOW UP ADD'L GEST from 07/04/2014 in THE [facility] OF   ULTRASOUND US OB FOLLOW UP ADD'L GEST from 06/27/2014 in THE [facility] OF  ULTRASOUND  PHQ-2 Total Score  0  0  0  0  0       Assessment and Plan:  Brittney Harrison is a 37 year old female with multiple sclerosis, chronic fatigue, and a history of major depressive disorder, presenting for follow-up medication management for mood and ADHD symptoms.  She seems to have had a positive response with the increase in the Lexapro and initiation of Adderall.  Today she is processing some of her ongoing frustrations and grief from marital conflict, and infidelity.  We spent time processing some of her expectations and boundaries.  She does not present with any acute safety issues.  She does continue to present with some distractibility, and tangentiality, and I suspect she would benefit from an increase in the Adderall.  We agreed to switch to the extended release formulation for ease of administration/adherence.  We will follow-up in one month or sooner if needed.   1. Attention deficit hyperactivity disorder (ADHD), combined type   2. Chronic fatigue     Status of current problems: gradually improving  Labs Ordered: No orders of the defined types were placed in this encounter.   Labs Reviewed: n/a  Collateral Obtained/Records Reviewed: n/a  Plan:  Increase Adderall to 30 mg extended release Continue Lexapro 20 mg Follow-up in 1 month Follow-up regarding sleep study, unclear why patient was declined for in-lab assessment  Burnard Leigh, MD 10/30/2017, 12:29 PM

## 2017-11-28 ENCOUNTER — Encounter (HOSPITAL_COMMUNITY): Payer: Self-pay | Admitting: Psychiatry

## 2017-11-28 ENCOUNTER — Ambulatory Visit (HOSPITAL_COMMUNITY): Payer: 59 | Admitting: Psychiatry

## 2017-11-28 DIAGNOSIS — F902 Attention-deficit hyperactivity disorder, combined type: Secondary | ICD-10-CM | POA: Diagnosis not present

## 2017-11-28 DIAGNOSIS — R5382 Chronic fatigue, unspecified: Secondary | ICD-10-CM | POA: Diagnosis not present

## 2017-11-28 DIAGNOSIS — F411 Generalized anxiety disorder: Secondary | ICD-10-CM

## 2017-11-28 MED ORDER — HYDROXYZINE HCL 50 MG PO TABS: 50.0000 mg | ORAL_TABLET | ORAL | 1 refills | Status: DC | PRN

## 2017-11-28 MED ORDER — ESCITALOPRAM OXALATE 20 MG PO TABS: 20.0000 mg | ORAL_TABLET | Freq: Every day | ORAL | 0 refills | Status: AC

## 2017-11-28 MED ORDER — AMPHET-DEXTROAMPHET 3-BEAD ER 50 MG PO CP24: 50.0000 mg | ORAL_CAPSULE | Freq: Every day | ORAL | 0 refills | Status: DC

## 2017-11-28 NOTE — Patient Instructions (Signed)
[facility] [phone] 

## 2017-11-28 NOTE — Progress Notes (Signed)
BH MD/PA/NP OP Progress Note  11/28/2017 4:34 PM Brittney Harrison  MRN:  [id number]  Chief Complaint:  Husband cheating  HPI: Brittney Harrison presents today to process some ongoing marital stressors.  She feels like the Adderall has helped and we agreed to switch to the higher dose of the extended release.  She has not had any significant intolerance and she has noticed that her energy and her ability to attend to tasks is better, but she still has some ongoing breakthrough inattention and distractibility.  She is sleeping well at night.  No acute safety issues or substance abuse.  We agreed to follow-up in 1 month or sooner if needed.  Visit Diagnosis:    ICD-10-CM   1. GAD (generalized anxiety disorder) F41.1 escitalopram (LEXAPRO) 20 MG tablet    Ambulatory referral to Psychology  2. Attention deficit hyperactivity disorder (ADHD), combined type F90.2 Amphet-Dextroamphet 3-Bead ER 50 MG CP24    Ambulatory referral to Psychology  3. Chronic fatigue R53.82 Amphet-Dextroamphet 3-Bead ER 50 MG CP24    Ambulatory referral to Psychology    Past Psychiatric History: See intake H&P for full details. Reviewed, with no updates at this time.  Past Medical History:  Past Medical History:  Diagnosis Date  . Abnormal Pap smear 2007   CIN-1  . 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 Asthma   . 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 Clitoral irritation 07/2006  . Complication of anesthesia    severe vomiting  . Fatigue   . Fibrocystic breast 2007  . Fullness of breast 01/2007   right  . GERD (gastroesophageal reflux disease)   . H/O seasonal allergies   . H/O varicella   . Headache(784.0)    migraines   . Hx: UTI (urinary tract infection)   . Increased BMI   . Irregular bleeding 10/2005  . Multiple sclerosis (HCC)   . Thyroid disease    Fluxuate between hyper to hypo  . 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 Vision abnormalities   . Yeast vaginitis 07/2006    Past Surgical History:   Procedure Laterality Date  . CHOLECYSTECTOMY  09/11/91  . DILATION AND CURETTAGE OF UTERUS  2011   endometritis  . DILATION AND CURETTAGE OF UTERUS  11/19/2011   Procedure: DILATATION AND CURETTAGE;  Surgeon: Hal Morales, MD;  Location: [facility];  Service: Gynecology;  Laterality: N/A;  dilitation and currettage with repair of intraoperative cervical laceration.  . TONSILLECTOMY  10/28/97  . WISDOM TOOTH EXTRACTION  2001    Family Psychiatric History: See intake H&P for full details. Reviewed, with no updates at this time.   Family History:  Family History  Problem Relation Age of Onset  . Heart disease Father   . Alcohol abuse Father   . Melanoma Father   . Cancer Maternal Aunt        Thyroid & breast  . Cancer Paternal Uncle        Esophageal  . Diabetes Maternal Grandmother   . Cancer Maternal Grandmother        Kidney  . Cancer Paternal Grandmother   . Hypothyroidism Mother   . Goiter Mother   . Multiple sclerosis Cousin     Social History:  Social History   Socioeconomic History  . Marital status: Married    Spouse name: Not on file  . Number of children: Not on file  . Years of education: Not on file  . Highest education level: Not on  file  Occupational History  . Not on file  Social Needs  . Financial resource strain: Not on file  . Food insecurity:    Worry: Not on file    Inability: Not on file  . Transportation needs:    Medical: Not on file    Non-medical: Not on file  Tobacco Use  . Smoking status: Current Every Day Smoker    Packs/day: 1.50    Years: 10.00    Pack years: 15.00    Types: Cigarettes    Last attempt to quit: 02/13/2011    Years since quitting: 6.7  . Smokeless tobacco: Never Used  . Tobacco comment: Has quit 3 times this year but reports due to anxiety now  Substance and Sexual Activity  . Alcohol use: Yes    Alcohol/week: 2.0 standard drinks    Types: 2 Cans of beer per week  . Drug use: No  . Sexual activity: Yes     Partners: Male    Birth control/protection: IUD  Lifestyle  . Physical activity:    Days per week: Not on file    Minutes per session: Not on file  . Stress: Not on file  Relationships  . Social connections:    Talks on phone: Not on file    Gets together: Not on file    Attends religious service: Not on file    Active member of club or organization: Not on file    Attends meetings of clubs or organizations: Not on file    Relationship status: Not on file  Other Topics Concern  . Not on file  Social History Narrative  . Not on file    Allergies:  Allergies  Allergen Reactions  . Codeine Nausea And Vomiting  . Eye-Sed Ophthalmic [Zinc] Itching    Eye gel   . Percocet [Oxycodone-Acetaminophen] Nausea And Vomiting    Metabolic Disorder Labs: No results found for: HGBA1C, MPG No results found for: PROLACTIN No results found for: CHOL, TRIG, HDL, CHOLHDL, VLDL, LDLCALC Lab Results  Component Value Date   TSH 0.59 10/10/2017   TSH 0.43 07/20/2011    Therapeutic Level Labs: No results found for: LITHIUM No results found for: VALPROATE No components found for:  CBMZ  Current Medications: Current Outpatient Medications  Medication Sig Dispense Refill  . albuterol (PROAIR HFA) 108 (90 Base) MCG/ACT inhaler as needed.    . Amphet-Dextroamphet 3-Bead ER 50 MG CP24 Take 50 mg by mouth daily. 30 capsule 0  . Biotin 16109 MCG TABS Take 2 tablets by mouth.    . cetirizine (ZYRTEC) 10 MG tablet Take 10 mg by mouth daily.    . Cholecalciferol (VITAMIN D PO) Take 1 tablet by mouth daily.    [name] escitalopram (LEXAPRO) 20 MG tablet Take 1 tablet (20 mg total) by mouth daily. 90 tablet 0  . hydrOXYzine (ATARAX/VISTARIL) 50 MG tablet Take 1 tablet (50 mg total) by mouth as needed. 30 tablet 1  . methimazole (TAPAZOLE) 5 MG tablet Take 1 tablet (5 mg total) by mouth daily. 30 tablet 11  . Teriflunomide (AUBAGIO) 14 MG TABS Take 14 mg by mouth daily.     No current  facility-administered medications for this visit.     Musculoskeletal: Strength & Muscle Tone: within normal limits Gait & Station: normal Patient leans: N/A  Psychiatric Specialty Exam: ROS  Blood pressure 138/76, pulse 80, height 5\' 8"  (1.727 m), weight 189 lb (85.7 kg), currently breastfeeding.Body mass index is 28.74 kg/m.  General Appearance: Casual and Well Groomed  Eye Contact:  Good  Speech:  Clear and Coherent and Pressured  Volume:  Normal  Mood:  upset about marrital stress  Affect:  Appropriate and Congruent  Thought Process:  Goal Directed and Descriptions of Associations: Intact  Orientation:  Full (Time, Place, and Person)  Thought Content: Logical   Suicidal Thoughts:  No  Homicidal Thoughts:  No  Memory:  Immediate;   Fair  Judgement:  Fair  Insight:  Fair  Psychomotor Activity:  Normal  Concentration:  Concentration: Fair  Recall:  Fair  Fund of Knowledge: Good  Language: Good  Akathisia:  Negative  Handed:  Right  AIMS (if indicated): na  Assets:  Communication Skills Desire for Improvement Financial Resources/Insurance Housing Social Support Transportation Vocational/Educational  ADL's:  Intact  Cognition: WNL  Sleep:  Good   Screenings: PHQ2-9     US OB FOLLOW UP ADD'L GEST from 08/29/2014 in THE [facility] [place] ULTRASOUND US OB FOLLOW UP ADD'L GEST from 08/15/2014 in THE [facility] [place] ULTRASOUND US OB FOLLOW UP ADD'L GEST from 08/08/2014 in THE [facility] [place] ULTRASOUND US OB FOLLOW UP ADD'L GEST from 07/04/2014 in THE [facility] [place] ULTRASOUND US OB FOLLOW UP ADD'L GEST from 06/27/2014 in THE [facility] OF  ULTRASOUND  PHQ-2 Total Score  0  0  0  0  0       Assessment and Plan:  Brittney Harrison is a 37 year old female with multiple sclerosis, chronic fatigue, and a history of major depressive disorder, presenting for follow-up medication management for mood and ADHD  symptoms.    Having a good response to extended release Adderall and would benefit from an up titration, we will switch to mydayis 50 mg and follow-up in 1 month.  She would also benefit from a therapy referral for ongoing processing of her significant marital stressors.   1. GAD (generalized anxiety disorder)   2. Attention deficit hyperactivity disorder (ADHD), combined type   3. Chronic fatigue     Status of current problems: gradually improving  Labs Ordered: Orders Placed This Encounter  Procedures  . Ambulatory referral to Psychology    Referral Priority:   Routine    Referral Type:   Psychiatric    Referral Reason:   Specialty Services Required    Requested Specialty:   Psychology    Number of Visits Requested:   1    Labs Reviewed: n/a  Collateral Obtained/Records Reviewed: n/a  Plan:  Increase Adderall to 30 mg extended release Continue Lexapro 20 mg Follow-up in 1 month  Burnard Leigh, MD 11/28/2017, 4:34 PM

## 2018-01-10 ENCOUNTER — Ambulatory Visit: Payer: Self-pay | Admitting: Endocrinology

## 2018-01-17 ENCOUNTER — Ambulatory Visit: Payer: Self-pay | Admitting: Endocrinology

## 2018-01-24 ENCOUNTER — Ambulatory Visit: Payer: Self-pay | Admitting: Endocrinology

## 2018-01-24 DIAGNOSIS — Z0289 Encounter for other administrative examinations: Secondary | ICD-10-CM

## 2018-02-22 ENCOUNTER — Ambulatory Visit: Payer: 59 | Admitting: Clinical

## 2018-02-22 DIAGNOSIS — F4323 Adjustment disorder with mixed anxiety and depressed mood: Secondary | ICD-10-CM | POA: Diagnosis not present

## 2018-05-07 DIAGNOSIS — F4312 Post-traumatic stress disorder, chronic: Secondary | ICD-10-CM | POA: Diagnosis not present

## 2018-05-15 ENCOUNTER — Ambulatory Visit: Payer: 59 | Admitting: Clinical

## 2018-06-18 ENCOUNTER — Telehealth: Payer: Self-pay | Admitting: *Deleted

## 2018-06-18 NOTE — Telephone Encounter (Signed)
PA for Aubagio 14mg  #90/90 completed via phone with [facility], phone# [phone]. Dx: RRMS (G35). Tried and failed meds: Copaxone. PA approved until 06/18/19. Case ID: [id number]

## 2018-07-02 DIAGNOSIS — F4312 Post-traumatic stress disorder, chronic: Secondary | ICD-10-CM | POA: Diagnosis not present

## 2018-08-29 DIAGNOSIS — F4312 Post-traumatic stress disorder, chronic: Secondary | ICD-10-CM | POA: Diagnosis not present

## 2018-09-10 ENCOUNTER — Telehealth: Payer: Self-pay | Admitting: Neurology

## 2018-09-10 DIAGNOSIS — M2550 Pain in unspecified joint: Secondary | ICD-10-CM | POA: Diagnosis not present

## 2018-09-10 DIAGNOSIS — H15001 Unspecified scleritis, right eye: Secondary | ICD-10-CM | POA: Diagnosis not present

## 2018-09-10 DIAGNOSIS — G35 Multiple sclerosis: Secondary | ICD-10-CM | POA: Diagnosis not present

## 2018-09-10 NOTE — Telephone Encounter (Signed)
I spoke with Dr. Dierdre Forth about Brittney Harrison.  She has an undefined rheumatologic disorder likely RA or related.  Both her MS and rheumatologic disorders seem well controlled on Aubagio but she stopped due to diarrhea.  I had previously discussed some other options.  Ocrevus or Rituxan would be another medication that could help both of her disorders.  She currently does not have a follow-up appointment and I will see if we can get her seen sometime in the next couple weeks so that we can start a therapy.

## 2018-12-21 DIAGNOSIS — M2550 Pain in unspecified joint: Secondary | ICD-10-CM | POA: Diagnosis not present

## 2018-12-21 DIAGNOSIS — H15001 Unspecified scleritis, right eye: Secondary | ICD-10-CM | POA: Diagnosis not present

## 2018-12-21 DIAGNOSIS — G35 Multiple sclerosis: Secondary | ICD-10-CM | POA: Diagnosis not present

## 2018-12-24 ENCOUNTER — Telehealth: Payer: Self-pay | Admitting: *Deleted

## 2018-12-24 NOTE — Telephone Encounter (Signed)
Called pt. Advised Dr. Felecia Shelling reviewed note from [facility]. He would like to work her in for sooner appt this month and cx appt 02/19/19. I scheduled appt for 01/07/19 at 9am with Dr. Felecia Shelling. She verbalized understanding and appreciation.

## 2019-01-07 ENCOUNTER — Ambulatory Visit: Payer: BC Managed Care – PPO | Admitting: Neurology

## 2019-01-07 ENCOUNTER — Other Ambulatory Visit: Payer: Self-pay

## 2019-01-07 ENCOUNTER — Encounter: Payer: Self-pay | Admitting: Neurology

## 2019-01-07 VITALS — BP 127/81 | HR 75 | Temp 97.1°F | Ht 67.25 in | Wt 186.0 lb

## 2019-01-07 DIAGNOSIS — Z79899 Other long term (current) drug therapy: Secondary | ICD-10-CM | POA: Diagnosis not present

## 2019-01-07 DIAGNOSIS — M459 Ankylosing spondylitis of unspecified sites in spine: Secondary | ICD-10-CM

## 2019-01-07 DIAGNOSIS — M545 Low back pain: Secondary | ICD-10-CM | POA: Diagnosis not present

## 2019-01-07 DIAGNOSIS — G35 Multiple sclerosis: Secondary | ICD-10-CM

## 2019-01-07 DIAGNOSIS — G4733 Obstructive sleep apnea (adult) (pediatric): Secondary | ICD-10-CM

## 2019-01-07 DIAGNOSIS — G4719 Other hypersomnia: Secondary | ICD-10-CM

## 2019-01-07 DIAGNOSIS — G8929 Other chronic pain: Secondary | ICD-10-CM

## 2019-01-07 NOTE — Progress Notes (Addendum)
[facility]  PATIENT: Brittney Harrison DOB: 01/14/1981  REFERRING DOCTOR OR PCP:  Brittney Harrison (PCP); Harlen LabsEmily Harrison [phone]([facility]) SOURCE: Patient, notes from Dr. Renne CriglerPharr, imaging and lab results, MRI images on PACS CD  _________________________________   HISTORICAL  CHIEF COMPLAINT:  Chief Complaint  Patient presents with   Follow-up    RM 13, alone. Last seen 06/23/2017. May have RA and just dx with back issues. Sees Rheumatologist. She also has HS (air follicle disorder).    Multiple Sclerosis    Was on Aubagio but stopped d/t diarrhea. Stopped almost a year ago. Wants to discuss other DMT options for MS   Gait Problem    Ambulates with cane.     HISTORY OF PRESENT ILLNESS:  Brittney Harrison Is a 38 year old woman diagnosed with relapsing remitting multiple sclerosis in 2014.  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   She stopped Aubagio over a year ago due to diarrhea.   .   Copaxone was poorly tolerated.   MRI of the brain 11/2016 showed infratentorial and supratentorial lesions.  Due to her level of aggressiveness ,  I had wanted hr to go on Ocrevus but Ocrevus was not approved for her despite appeal.     September 3rd, she had tingling from the hips down.  The next day her legs were wobbly and achy.  She had more fatigue.    She was better a few days later.   The next week, she was had a return of symptoms and also had gait disturbance..   The right leg seems worse than her left with more weakness and a tight sensation in her leg.   Her left leg seems weaker and the hip drops.    She has ankylosis spondylosis, previously on     I have spoken to Brittney Harrison [phone]), her rheumatologist, in the past.     Fatigue and sleepiness are much worse and she is needing to take longer naps.   She has witnessed OSA --- pause followed by gasp.     EPWORTH SLEEPINESS SCALE  On a scale of 0  - 3 what is the chance of dozing:  Sitting and Reading:   3 Watching TV:    2 Sitting inactive in a public place: 0 Passenger in car for one hour: 2 Lying down to rest in the afternoon: 3 Sitting and talking to someone: 1 Sitting quietly after lunch:  3 In a car, stopped in traffic:  0  Total (out of 24):    14/24 moderate excessive daytime sleepines   Update 06/23/2017: Ocrelizumab was not approved by her insurance.  Yesterday, the insurance company was called to find out what options would be approved.  They cover all 3 of the pills and Copaxone.  In the past she wason COpaxone and had a lot of skin reactions and does not think she could do it again.     She does note some new facial numbness that is bilateral over the last few months -- it is fluctuating x a few hours.   She also had a few rounds of vertigo x one hour.    Other aspects of her MS are stable.  Specifically she notes no changes in her gait or strength.  She has some urinary frequency with rare incontinence.  She continues to have some fatigue.  She was hospitalized x 4 days for pneumonia,  given IV antibiotics and is noe on Levaquin.     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    Prednisone seemed to be helping but is not good long-term.    Methotrexate is being considered    Update 01/23/2017:    She has not been on any DMT and her insurance company has not yet approved Ocrevus for her.  She has had more hand numbness and visual changes since the last visit.  Her left eye got painful and she had more black spots while driving to [facility].   She had optic neuritis in the past.    She felt vision was back to normal after a few hours.   She also had right finger numbness for 4 days.      Bladder is doing the same.     The brain MRI 12/09/2016 and was stable compared to her previous one a few  years earlier.     She has a lot of trouble with fatigue. She has both physical and mental fatigue.  She gets occasional waves of lassitude, usually in the late afternoons.    She sleeps well.   She has noted more stress but this is better than a few weeks ago.   She has noted more trouble with recall.    She gets some unusual spell of distorted time perception while moving where she feels her brian is moving fast but she is moving slow.   This may last x 5-10 minutes and there will be several spells in the same day.  This occurs once a month.    _______________________________________ From 10/13/2016:  In May 2002, she had thoracic dysesthesias.   In 2003, while at college, she had severe fatigue and droppey d out.    In 2004, she had a episode of right leg numbness x 3 weeks.   It built up over a period of one day.   She had no insurance and improved so did not seek medical opinion.   In 2005, she had more right leg issues and in 2006 had right hand numbness.    Fatigue was also worse.   She also began to note some milder cognitive issues in 2006 that worsened in 2008.  She had severe left leg cramping in 2007 associated with some weakness for a while.  She also noted right facial symptoms in 2008.     In October 2014, she had the onset of black spots in her vision.  An MRI was performed worrisome for MS and an LP showed CSF consistent with MS.   She also had more urinary incontinence around that time.    Initially, she saw Dr. Clarisse Harrison (who also saw her sister with IIH).   He referred her to Dr. Renne Harrison who started her on Copaxone.   She stopped Copaxone a year later for pregnancy and did not restart after delivery June 2016 due to needle fatigue.    Gait/strength/sensation:  She occasionally feels off balanced but has no recent falls and feels gait is usually fine.    She has a tight sensation in her toes bilaterally, left worse than right.  She denies weakness.   She has had 'zingers' in her neck since 2010  with flexion with symptoms going down the right arm.      Vision:   She notes visual blurring out  of the left eye. She still sees black spots in her vision.   Color vision is not affected.  She denies diplopia.    Bladder:  She has had stress incontinence since her first pregnancy.   She has had some spells of incontinence in 2009 before any pregnancy and also had some last year.   Fatigue/sleep:  She is tired daily since 2008, regardless of the duration or quality of her sleep.   Some days seem worse.   Fatigue is worse with heat and as the day goes on.   She tried Ritalin which helped her brian fog more than the fatigue.   She sleeps 6-8 hours nightly depending on her kids.  She has mild sleep onset insomnia but once asleep she stays asleep.  Cognitive:   She reports a mental fog since 2005.   She notes worsening word finding issues and decreased focus and some forgetfulness.  Mood:    She reports some depression since December that seemed to come out of nowhere.   She feels better this month than a few months ago but not to baseline.    She has had anxiety x years and feels this is stable.   She worries a lot, in general.     FH:   Two distant cousins have MS.     Two family members has had ALS a swell and several have fibromyalgia.  Her 41 yo grandmoither has dementia.    I personally reviewed the MRIs of the brain dated 08/15/2013 and 11/12/2014 and the MRI of the cervical spine 02/21/2013. The MRIs of the brain show a stable pattern of T2/FLAIR hyperintense foci in the periventricular, juxtacortical and deep white matter consistent with MS. There is also a focus in the right cerebral peduncle. The brain stem appear normal. None of the foci enhanced. The MRI of the cervical spine shows a small posterior focus adjacent to C5-C6.  I also reviewed laboratory and studies from 2014. Visual, brain stem and somatosensory evoked responses were normal. According to a note from Dr. Shelia Media, CSF was abnormal  showing greater than 5 oligoclonal bands.  REVIEW OF SYSTEMS: Constitutional: No fevers, chills, sweats, or change in appetite.  She notes fatigue. Eyes: No visual changes, double vision, eye pain Ear, nose and throat: No hearing loss, ear pain, nasal congestion, sore throat Cardiovascular: No chest pain, palpitations Respiratory: No shortness of breath at rest or with exertion.   No wheezes GastrointestinaI: No nausea, vomiting, diarrhea, abdominal pain, fecal incontinence Genitourinary: No dysuria, urinary retention or frequency.  No nocturia. Musculoskeletal: No neck pain, back pain Integumentary: No rash, pruritus, skin lesions Neurological: as above Psychiatric: No depression at this time.  No anxiety Endocrine: No palpitations, diaphoresis, change in appetite, change in weigh or increased thirst Hematologic/Lymphatic: No anemia, purpura, petechiae. Allergic/Immunologic: No itchy/runny eyes, nasal congestion, recent allergic reactions, rashes  ALLERGIES: Allergies  Allergen Reactions   Codeine Nausea And Vomiting   Eye-Sed Ophthalmic [Zinc] Itching    Eye gel    Percocet [Oxycodone-Acetaminophen] Nausea And Vomiting    HOME MEDICATIONS:  Current Outpatient Medications:    albuterol (PROAIR HFA) 108 (90 Base) MCG/ACT inhaler, as needed., Disp: , Rfl:    Amphet-Dextroamphet 3-Bead ER 50 MG CP24, Take 50 mg by mouth daily., Disp: 30 capsule, Rfl: 0   cetirizine (ZYRTEC) 10 MG tablet, Take 10 mg by mouth daily., Disp: , Rfl:    Cholecalciferol (VITAMIN D PO), Take 1 tablet by mouth daily., Disp: ,  Rfl:    escitalopram (LEXAPRO) 20 MG tablet, Take 1 tablet (20 mg total) by mouth daily., Disp: 90 tablet, Rfl: 0   hydrOXYzine (ATARAX/VISTARIL) 50 MG tablet, Take 1 tablet (50 mg total) by mouth as needed., Disp: 30 tablet, Rfl: 1   naproxen sodium (ALEVE) 220 MG tablet, Take 220 mg by mouth. 2-4 tablets daily, Disp: , Rfl:   PAST MEDICAL HISTORY: Past Medical  History:  Diagnosis Date   Abnormal Pap smear 2007   CIN-1   ASCUS with positive high risk HPV 05/2005   Asthma    CIN I (cervical intraepithelial neoplasia I) 06/2005   Clitoral irritation 07/2006   Complication of anesthesia    severe vomiting   Fatigue    Fibrocystic breast 2007   Fullness of breast 01/2007   right   GERD (gastroesophageal reflux disease)    H/O seasonal allergies    H/O varicella    Headache(784.0)    migraines    Hx: UTI (urinary tract infection)    Increased BMI    Irregular bleeding 10/2005   Multiple sclerosis (HCC)    Thyroid disease    Fluxuate between hyper to hypo   Thyromegaly 11/2005   Vision abnormalities    Yeast vaginitis 07/2006    PAST SURGICAL HISTORY: Past Surgical History:  Procedure Laterality Date   CHOLECYSTECTOMY  09/11/91   DILATION AND CURETTAGE OF UTERUS  2011   endometritis   DILATION AND CURETTAGE OF UTERUS  11/19/2011   Procedure: DILATATION AND CURETTAGE;  Surgeon: Hal Morales, MD;  Location: [facility];  Service: Gynecology;  Laterality: N/A;  dilitation and currettage with repair of intraoperative cervical laceration.   TONSILLECTOMY  10/28/97   WISDOM TOOTH EXTRACTION  2001    FAMILY HISTORY: Family History  Problem Relation Age of Onset   Heart disease Father    Alcohol abuse Father    Melanoma Father    Cancer Maternal Aunt        Thyroid & breast   Cancer Paternal Uncle        Esophageal   Diabetes Maternal Grandmother    Cancer Maternal Grandmother        Kidney   Cancer Paternal Grandmother    Hypothyroidism Mother    Goiter Mother    Multiple sclerosis Cousin     SOCIAL HISTORY:  Social History   Socioeconomic History   Marital status: Divorced    Spouse name: Not on file   Number of children: Not on file   Years of education: Not on file   Highest education level: Not on file  Occupational History   Not on file  Social Needs   Financial resource  strain: Not on file   Food insecurity    Worry: Not on file    Inability: Not on file   Transportation needs    Medical: Not on file    Non-medical: Not on file  Tobacco Use   Smoking status: Current Every Day Smoker    Packs/day: 1.50    Years: 10.00    Pack years: 15.00    Types: Cigarettes    Last attempt to quit: 02/13/2011    Years since quitting: 7.9   Smokeless tobacco: Never Used   Tobacco comment: Has quit 3 times this year but reports due to anxiety now  Substance and Sexual Activity   Alcohol use: Yes    Alcohol/week: 2.0 standard drinks    Types: 2 Cans of beer per week  Drug use: No   Sexual activity: Yes    Partners: Male    Birth control/protection: I.U.D.  Lifestyle   Physical activity    Days per week: Not on file    Minutes per session: Not on file   Stress: Not on file  Relationships   Social connections    Talks on phone: Not on file    Gets together: Not on file    Attends religious service: Not on file    Active member of club or organization: Not on file    Attends meetings of clubs or organizations: Not on file    Relationship status: Not on file   Intimate partner violence    Fear of current or ex partner: Not on file    Emotionally abused: Not on file    Physically abused: Not on file    Forced sexual activity: Not on file  Other Topics Concern   Not on file  Social History Narrative   Not on file     PHYSICAL EXAM  Vitals:   01/07/19 0851  BP: 127/81  Pulse: 75  Temp: (!) 97.1 F (36.2 C)  Weight: 186 lb (84.4 kg)  Height: 5' 7.25" (1.708 m)    Body mass index is 28.92 kg/m.   General: The patient is well-developed and well-nourished and in no acute distress.  She has reduced range of motion in the neck.  Mild tenderness in the lower lumbar spine   Neurologic Exam  Mental status: The patient is alert and oriented x 3 at the time of the examination. The patient has apparent normal recent and remote memory,  with an apparently normal attention span and concentration ability.   Speech is normal.  Cranial nerves: Extraocular movements are full.  Color vision is symmetric but reduced VA OS.   There is good facial sensation to soft touch bilaterally.Facial strength is normal.  Trapezius and sternocleidomastoid strength is normal. No dysarthria is noted.  The tongue is midline, and the patient has symmetric elevation of the soft palate. No obvious hearing deficits are noted.  Motor:  Muscle bulk is normal.   Muscle tone is normal her strength is 5/5 in the arms and legs.   4+/5 in hip flexion and feet  Sensory: She has intact touch sensation in the arms or legs.     Coordination: Cerebellar testing reveals good finger-nose-finger but she has reduced heel-to-shin, left worse than right.  Gait and station: Station is normal.   Her gait is wide and tandem gait is poor   Reflexes: Deep tendon reflexes are normal in the arms but increased at knees and ankles, left > right.    No ankle clonus      DIAGNOSTIC DATA (LABS, IMAGING, TESTING) - I reviewed patient records, labs, notes, testing and imaging myself where available.  Lab Results  Component Value Date   WBC 5.4 06/17/2017   HGB 13.2 06/17/2017   HCT 39.1 06/17/2017   MCV 89.9 06/17/2017   PLT 151 06/17/2017        ASSESSMENT AND PLAN   Multiple sclerosis (HCC) - Plan: QuantiFERON-TB Gold Plus, Hepatitis B core antibody, total, Hepatitis B surface antigen, CBC with Differential/Platelet, Comprehensive metabolic panel, Hepatitis B surface antibody,qualitative, HIV Antibody (routine testing w rflx), MR BRAIN W WO CONTRAST, MR CERVICAL SPINE W WO CONTRAST, MR THORACIC SPINE W WO CONTRAST  High risk medication use - Plan: QuantiFERON-TB Gold Plus, Hepatitis B core antibody, total, Hepatitis B surface antigen, CBC  with Differential/Platelet, Comprehensive metabolic panel, Hepatitis B surface antibody,qualitative, HIV Antibody (routine testing w  rflx)  Ankylosing spondylitis, unspecified site of spine (HCC) - Plan: MR LUMBAR SPINE WO CONTRAST  Chronic bilateral low back pain, unspecified whether sciatica present - Plan: MR LUMBAR SPINE WO CONTRAST  OSA (obstructive sleep apnea) - Plan: PSG SLEEP STUDY  Excessive daytime sleepiness - Plan: PSG SLEEP STUDY   1.    We discussed disease modifying therapies for relapsing remitting MS and rheumatologic issues.   I think Rituxan or Ocrevus wouLd be the best option.   Will discuss with Brittney Harrison.    We will check labs.    Needs MRI of the brain and spine due to new neurologic symptoms to help guide therapy.    Needs MRI of the Lumbar spine due to new diagnosis of ankylosis spondylosis and lower back pain. 2.    Continue to be active and exercise as tolerated. 3.   She will return to see me in 4-5 months or sooner if there are new or worsening neurologic symptoms.  Makarios Madlock A. Epimenio FootSater, MD, [facility],FAAN 01/07/2019, 5:32 PM Certified in Neurology, Clinical Neurophysiology, Sleep Medicine, Pain Medicine and Neuroimaging  [facility] [address], [address] [place], [place] [address] [phone]  ADDENDUM:  I spoke with Brittney Harrison who sees her for her rheumatologic issues.  We are in agreement that an anti-CD20 medication might be the best option for her to treat both the MS and rheumatologic disorders.  We will see if she qualifies for Ocrevus. -- RAS 01/08/19

## 2019-01-09 ENCOUNTER — Telehealth: Payer: Self-pay | Admitting: Neurology

## 2019-01-09 ENCOUNTER — Telehealth: Payer: Self-pay | Admitting: *Deleted

## 2019-01-09 LAB — HEPATITIS B CORE ANTIBODY, TOTAL: Hep B Core Total Ab: NEGATIVE

## 2019-01-09 LAB — CBC WITH DIFFERENTIAL/PLATELET
Basophils Absolute: 0.1 10*3/uL (ref 0.0–0.2)
Basos: 1 %
EOS (ABSOLUTE): 0.2 10*3/uL (ref 0.0–0.4)
Eos: 2 %
Hematocrit: 45.1 % (ref 34.0–46.6)
Hemoglobin: 15.6 g/dL (ref 11.1–15.9)
Immature Grans (Abs): 0 10*3/uL (ref 0.0–0.1)
Immature Granulocytes: 0 %
Lymphocytes Absolute: 3.2 10*3/uL — ABNORMAL HIGH (ref 0.7–3.1)
Lymphs: 30 %
MCH: 31.5 pg (ref 26.6–33.0)
MCHC: 34.6 g/dL (ref 31.5–35.7)
MCV: 91 fL (ref 79–97)
Monocytes Absolute: 0.5 10*3/uL (ref 0.1–0.9)
Monocytes: 5 %
Neutrophils Absolute: 6.7 10*3/uL (ref 1.4–7.0)
Neutrophils: 62 %
Platelets: 251 10*3/uL (ref 150–450)
RBC: 4.96 x10E6/uL (ref 3.77–5.28)
RDW: 13.2 % (ref 11.7–15.4)
WBC: 10.7 10*3/uL (ref 3.4–10.8)

## 2019-01-09 LAB — COMPREHENSIVE METABOLIC PANEL
ALT: 17 IU/L (ref 0–32)
AST: 17 IU/L (ref 0–40)
Albumin/Globulin Ratio: 1.8 (ref 1.2–2.2)
Albumin: 4.3 g/dL (ref 3.8–4.8)
Alkaline Phosphatase: 69 IU/L (ref 39–117)
BUN/Creatinine Ratio: 16 (ref 9–23)
BUN: 12 mg/dL (ref 6–24)
Bilirubin Total: 0.3 mg/dL (ref 0.0–1.2)
CO2: 24 mmol/L (ref 20–29)
Calcium: 9.7 mg/dL (ref 8.7–10.2)
Chloride: 101 mmol/L (ref 96–106)
Creatinine, Ser: 0.73 mg/dL (ref 0.57–1.00)
GFR calc Af Amer: 119 mL/min/{1.73_m2} (ref >59)
GFR calc non Af Amer: 103 mL/min/{1.73_m2} (ref >59)
Globulin, Total: 2.4 g/dL (ref 1.5–4.5)
Glucose: 102 mg/dL — ABNORMAL HIGH (ref 65–99)
Potassium: 4.3 mmol/L (ref 3.5–5.2)
Sodium: 138 mmol/L (ref 134–144)
Total Protein: 6.7 g/dL (ref 6.0–8.5)

## 2019-01-09 LAB — QUANTIFERON-TB GOLD PLUS
QuantiFERON Mitogen Value: >10 IU/mL
QuantiFERON Nil Value: 0.01 IU/mL
QuantiFERON TB1 Ag Value: 0.01 IU/mL
QuantiFERON TB2 Ag Value: 0.01 IU/mL
QuantiFERON-TB Gold Plus: NEGATIVE

## 2019-01-09 LAB — HIV ANTIBODY (ROUTINE TESTING W REFLEX): HIV Screen 4th Generation wRfx: NONREACTIVE

## 2019-01-09 LAB — HEPATITIS B SURFACE ANTIGEN: Hepatitis B Surface Ag: NEGATIVE

## 2019-01-09 LAB — HEPATITIS B SURFACE ANTIBODY,QUALITATIVE: Hep B Surface Ab, Qual: REACTIVE

## 2019-01-09 NOTE — Telephone Encounter (Signed)
[employer] Auth: [id number] (exp. 01/09/19 to 02/07/19) order sent to GI. They will reach out to the patient to schedule.

## 2019-01-09 NOTE — Telephone Encounter (Signed)
Faxed completed/signed Ocrevus start form to [employer] at [phone]. Received fax confirmation. Gave completed/signed form to [facility] to start processing for pt. Sent copy to be scanned to epic.

## 2019-01-10 ENCOUNTER — Telehealth: Payer: Self-pay | Admitting: *Deleted

## 2019-01-10 NOTE — Telephone Encounter (Signed)
-----   Message from Britt Bottom, MD sent at 01/09/2019  6:55 PM EDT ----- We got all of her lab work back including the TB test.  Labs are normal.  If we have not already done so let send in the [employer] form.

## 2019-01-10 NOTE — Telephone Encounter (Signed)
Called pt and relayed results per Dr. Felecia Shelling. Advised [facility] working on getting authorization for [employer] and then they will call to schedule. She verbalized understanding.  I gave TB test results to [facility]. They had other lab results already.

## 2019-01-20 ENCOUNTER — Ambulatory Visit
Admission: RE | Admit: 2019-01-20 | Discharge: 2019-01-20 | Disposition: A | Discharge status: Home / Self Care | Payer: BC Managed Care – PPO | Source: Ambulatory Visit | Attending: Neurology | Admitting: Neurology

## 2019-01-20 ENCOUNTER — Other Ambulatory Visit: Payer: Self-pay

## 2019-01-20 DIAGNOSIS — G35 Multiple sclerosis: Secondary | ICD-10-CM

## 2019-01-20 MED ORDER — GADOBENATE DIMEGLUMINE 529 MG/ML IV SOLN
18.0000 mL | Freq: Once | INTRAVENOUS | Status: AC | PRN
  Administered 2019-01-20: 18 mL via INTRAVENOUS

## 2019-01-21 ENCOUNTER — Telehealth: Payer: Self-pay | Admitting: *Deleted

## 2019-01-21 NOTE — Telephone Encounter (Signed)
Called and spoke with pt about MRI results per Dr. Felecia Shelling note. She verbalized understanding.  She is currently in process of filling out copay assistance form for Ocrevus. Has not scheduled infusion yet. Advised her to let us know if she gets approved for copay assistance.

## 2019-01-21 NOTE — Telephone Encounter (Signed)
-----   Message from Britt Bottom, MD sent at 01/20/2019  8:36 PM EDT ----- Please let the patient know that the MRI of the brain showed no new lesions.   MRI cervical spine showed one MS lesion but it did not look recent

## 2019-02-02 ENCOUNTER — Ambulatory Visit
Admission: RE | Admit: 2019-02-02 | Discharge: 2019-02-02 | Disposition: A | Discharge status: Home / Self Care | Payer: BC Managed Care – PPO | Source: Ambulatory Visit | Attending: Neurology | Admitting: Neurology

## 2019-02-02 ENCOUNTER — Other Ambulatory Visit: Payer: Self-pay

## 2019-02-02 DIAGNOSIS — M545 Low back pain, unspecified: Secondary | ICD-10-CM

## 2019-02-02 DIAGNOSIS — G8929 Other chronic pain: Secondary | ICD-10-CM | POA: Diagnosis not present

## 2019-02-02 DIAGNOSIS — M459 Ankylosing spondylitis of unspecified sites in spine: Secondary | ICD-10-CM | POA: Diagnosis not present

## 2019-02-02 DIAGNOSIS — G35 Multiple sclerosis: Secondary | ICD-10-CM

## 2019-02-02 MED ORDER — GADOBENATE DIMEGLUMINE 529 MG/ML IV SOLN
17.0000 mL | Freq: Once | INTRAVENOUS | Status: AC | PRN
  Administered 2019-02-02: 17 mL via INTRAVENOUS

## 2019-02-04 ENCOUNTER — Telehealth: Payer: Self-pay | Admitting: Neurology

## 2019-02-04 NOTE — Telephone Encounter (Signed)
I spoke to [facility] about the MRI of the thoracic and lumbar spine.  The MRI of the thoracic spine does show an arachnoid cyst.  Fortunately, it is not putting any pressure on the spinal cord.  We discussed that these usually are stable but there is potential that they could change in size service sometime in the next 2 to 3 years we will reimage.  The MRI of the lumbar spine showed mild multilevel degenerative changes with mild to moderate foraminal narrowing to the right at L4-L5 but no nerve root compression or spinal stenosis.

## 2019-02-15 ENCOUNTER — Other Ambulatory Visit: Payer: Self-pay

## 2019-02-15 ENCOUNTER — Ambulatory Visit (INDEPENDENT_AMBULATORY_CARE_PROVIDER_SITE_OTHER): Payer: BC Managed Care – PPO | Admitting: Neurology

## 2019-02-15 DIAGNOSIS — G4733 Obstructive sleep apnea (adult) (pediatric): Secondary | ICD-10-CM

## 2019-02-15 DIAGNOSIS — G4719 Other hypersomnia: Secondary | ICD-10-CM

## 2019-02-19 ENCOUNTER — Ambulatory Visit: Payer: Managed Care, Other (non HMO) | Admitting: Neurology

## 2019-02-19 NOTE — Progress Notes (Signed)
PATIENT'S NAME:  Brittney Harrison, Brittney Harrison DOB:      May 27, 1980      MR#:    [id number]     DATE OF RECORDING: 02/15/2019 REFERRING M.D.:  Farris Has, MD Study Performed:   Baseline Polysomnogram HISTORY:  Brittney Harrison Is a 38 year old woman diagnosed with relapsing remitting multiple sclerosis in 2014.  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   She has witnessed OSA --- pause followed by gasp.     The patient endorsed the Epworth Sleepiness Scale at 14 points.    The patient's weight 186 pounds with a height of 67.3 (inches), resulting in a BMI of 29.1 kg/m2.  The patient's neck circumference measured  inches.  CURRENT MEDICATIONS: Aleve, Hyroxyzine, Lexapro, Vitamin D, Zyrtec, Amphet-Dextroamphet, Albuterol,   PROCEDURE:  This is a multichannel digital polysomnogram utilizing the Somnostar 11.2 system.  Electrodes and sensors were applied and monitored per AASM Specifications.   EEG, EOG, Chin and Limb EMG, were sampled at 200 Hz.  ECG, Snore and Nasal Pressure, Thermal Airflow, Respiratory Effort, CPAP Flow and Pressure, Oximetry was sampled at 50 Hz. Digital video and audio were recorded.      BASELINE STUDY  Lights Out was at 20:50 and Lights On at 04:53.  Total recording time (TRT) was 483.5 minutes, with a total sleep time (TST) of 413.5 minutes.   The patient's sleep latency was 31.5 minutes.  REM latency was 136 minutes.  The sleep efficiency was 85.5 %.     SLEEP ARCHITECTURE: WASO (Wake after sleep onset) was 35 minutes.  There were 41 minutes in Stage N1, 248 minutes Stage N2, 27.5 minutes Stage N3 and 97 minutes in Stage REM.  The percentage of Stage N1 was 9.9%, Stage N2 was 60.%, Stage N3 was 6.7% and Stage R (REM sleep) was 23.5%.    RESPIRATORY ANALYSIS:  There were a total of 68 respiratory events:  0 obstructive apneas, 0 central apneas and 0 mixed apneas with a total  of 0 apneas and an apnea index (AI) of 0 /hour. There were 68 hypopneas with a hypopnea index of 9.9 /hour. The patient also had 0 respiratory event related arousals (RERAs).      The total APNEA/HYPOPNEA INDEX (AHI) was 9.9 /hour and the total RESPIRATORY DISTURBANCE INDEX was 0. 9.9 /hour.  56 events occurred in REM sleep and 24 events in NREM. The REM AHI was 34.6 /hour, versus a non-REM AHI of 2.3. The patient spent 241.5 minutes of total sleep time in the supine position and 172 minutes in non-supine.. The supine AHI was 12.7 versus a non-supine AHI of 5.9.  OXYGEN SATURATION & C02:  The Wake baseline 02 saturation was 91%, with the lowest being 86%. Time spent below 89% saturation equaled 2 minutes.  PERIODIC LIMB MOVEMENTS:   The patient had a total of 0 Periodic Limb Movements.  The Periodic Limb Movement (PLM) index was 0 and the PLM Arousal index was 0/hour. The arousals were noted as: 31 were spontaneous, 0 were associated with PLMs, 30 were associated with respiratory events.  OTHER: Audio and video analysis did not show any abnormal or unusual movements, behaviors, phonations or vocalizations.    Snoring was noted.    EKG showed normal sinus rhythm (NSR).  Post-study, the patient indicated that sleep was shorter than usual.    IMPRESSION:  1. Mild OSA with an  AHI = 9.9/hr.   OSA was severe during REM sleep with a REM-AHI = 34.6 2. Norma sleep efficiency.  All stages of sleep were recorded.   RECOMMENDATIONS:  1. For the mild OSA that was severe during REM, consider CPAP.  She could return for titration.  2. Alternatively, weight loss and an oral appliance could be considered 3. Follow up with Dr. Epimenio Foot   I certify that I have reviewed the entire raw data recording prior to the issuance of this report in accordance with the Standards of Accreditation of the American Academy of Sleep Medicine (AASM)    Shearon Baloichard ,MD, PhD Diplomat, American Board of Psychiatry and  Neurology, Sleep Medicine     Demographics and Medical History           Name: Brittney Harrison, Brittney Harrison Age: [age] BMI: 29.1 Interp Physician: Despina Ariasichard , MD  DOB: 09/05/1980 Ht-IN: 67.3 CM: 170 Referred By: Farris HasAaron Morrow, MD  Pt. Tag:  Wt-LB: 186 KG: 84 Tested By: Charlesetta IvoryBeau Handy RPSGT  Pt. #: [id number] Sex: Female Scored By: Charlesetta IvoryBeau Handy RPSGT  Bed Tag:  Race: Caucasian    Sleep Summary    Sleep Time Statistics Minutes Hours    Time in Bed 483.5    8.1    Total Sleep Time 413.5    6.9    Total Sleep Time NREM 316.5    5.3    Total Sleep Time REM 97    1.6    Sleep Onset 31.5    0.5    Wake After Sleep Onset 35    0.6    Wake After Sleep Period 3.5    0.1    Latency Persistent Sleep 31.5    0.5    Sleep Efficiency 85.5 Percent    Lights out 20:50     Lights on 04:53    Sleep Disruption Events Count Index    Arousals 61 8.9    Awakenings 24 3.5    Arousals + Awakenings 85 12.3    REM Awakenings 2 0.3     Sleep Stage Statistics Wake N1 N2 N3 REM    Percent Stage to SPT 7.8  9.1  55.3  6.1  21.6  Percent   Sleep Period Time in Stage 35 41 248 27.5 97 Minutes   Latency to Stage  31.5 36 23.5 136 Minutes   Percent Stage to TST  9.9 60. 6.7 23.5 Percent   EKG Summary          EKG Statistics         Heart Rate, Wake 91 BPM  TST Epochs in HR Interval 0 < 29   Heart Rate, Steady Sleep Avg 85 BPM   0 30-59   PAC Events 0 Count   214 60-79   PVC Events 0 Count   592 80-99   Bradycardia 0 Count   21 100-119   Tachycardia 0 Count   0 120-139        0 140-159    NREM REM   0 > 160   Shortest R-R .5 .6       Longest R-R .9 .8        Respiration Summary  Event Statistics Total  With Arousal  With Awakening    Count Index  Count Index  Count Index   Apneas, Total 0 0  0 0.0   0 0.0    Hypopneas, Total 68 9.9  30 4.4   6 0.9    Apnea +  Hypopnea Index 68 9.9   30 4.4   6 0.9    Apneas, Supine 0 0     Apneas, Non Supine 0 0     Hypopneas, Supine 51 12.7     Hypopneas, Non Supine  17 5.9     % Sleep Apnea 0 Percent     % Sleep Hypopnea 4.7 Percent    Oximetry Statistics       SpO2, Mean Wake 91 Percent     SpO2, Minimum 86 Percent     SpO2, Max 93 Percent     SpO2, Mean 90 Percent            Desaturation Index, REM 24.1  Index     Desaturation Index, NREM 1.3  Index     Desaturation Index, Total 6.7  Index             SpO2 Intervals > 89% 80-89% 70-79% 60-69% 50-59% 40-49% 30-39% < 30%  413.5 Percent Sleep Time 79.7 20.3 0 0 0 0 0 0  Body Position Statistics   Back Side L Side R Side Prone    Total Sleep Time   241.5 172.0 49 123 0 Minutes   Percent Time to TST   58.4  41.6  11.9  29.7  0.0  Percent   Number of Events   51 17.0 0 17 0 Count   Number of Apneas   0 0 0 0 0 Count   Number of Hypopneas   51 17 0 17 0 Count   Apnea Index   0.0  0.0  0.0  0.0  0.0  Index   Hypopnea Index   12.7  5.9  0.0  8.3  0.0  Index   Apnea + Hypopnea Index   12.7  5.9  0.0  8.3  0.0  Index  Respiration Events    Non REM, Pre Rx Statistics Non Supine  Supine    Central Mixed Obstr  Central Mixed Obstr   Apneas 0 0 0  0 0 0 Count  Apneas, Minimum SpO2 0 0 0  0 0 0 Percent     Hypopneas 0 0 6  0 0 6 Count  Hypopneas, Minimum SpO2 0 0 89  0 0 87 Percent     Apnea + Hypopneas Index 0.0 0.0 2.4  0.0 0.0 2.2 Index    REM, Pre Rx Statistics Non Supine  Supine    Central Mixed Obstr  Central Mixed Obstr   Apneas 0 0 0  0 0 0 Count  Apneas, Minimum SpO2 0 0 0  0 0 0 Percent     Hypopneas 0 0 11  0 0 45 Count  Hypopneas, Minimum SpO2 0 0 89  0 0 86 Percent     Apnea + Hypopnea Index 0.0 0.0 31.4  0.0 0.0 35.5 Index  Leg Movement Summary    PLM Non REM (Incl. Wake) REM Total    No Arousal Arousal Wake No Arousal Arousal Wake No Arousal Arousal Wake Total   Isolated 0 0 0 0 0 0 0 0 0 0    PLMS 0 0 0 0 0 0 0 0 0 0    Total 0 0 0 0 0 0 0 0 0 0   PLM Statistics PLMS Total     Count Index Count Index    PLM 0 0 0 0.0     PLM with Arousal 0 0 0 0.0      PLM, with Wake 0  0 0 0.0     PLM, Arousal + Wake 0 0.0 0 0.0     PLM, No Arousal 0 0.0  0 0.0     PLM, Non REM 0 0.0  0 0.0     PLM, REM 0 0.0  0 0.0     Technician Comments: The Patient came into the Sleep Lab for a NPSG study.  She snored mildly at times while on her sides and moderately while supine.  She had no trips to the restroom and was in NSR throughout the study.

## 2019-02-21 ENCOUNTER — Encounter: Payer: Self-pay | Admitting: *Deleted

## 2019-02-21 ENCOUNTER — Telehealth: Payer: Self-pay | Admitting: *Deleted

## 2019-02-21 DIAGNOSIS — G4733 Obstructive sleep apnea (adult) (pediatric): Secondary | ICD-10-CM

## 2019-02-21 NOTE — Telephone Encounter (Signed)
Called pt. Relayed results per Dr. Felecia Shelling note. Pt has no DME preference. Advised I will send order to get her set up with autopap to [place]. She verbalized understanding. I scheduled initial autopap f/u for 05/22/19 at 1pm. Sent pt letter detailing what we discussed.   Faxed orders to [place] at [phone]. Received confirmation.

## 2019-02-21 NOTE — Telephone Encounter (Signed)
-----   Message from Britt Bottom, MD sent at 02/20/2019  4:04 PM EST ----- Please let her know that she has mild overall obstructive sleep apnea but it was more severe during REM sleep.  Since she has sleepiness, I would recommend CPAP.  We can send in a request through DME / Home health

## 2019-02-27 DIAGNOSIS — G35 Multiple sclerosis: Secondary | ICD-10-CM | POA: Diagnosis not present

## 2019-03-11 DIAGNOSIS — G4733 Obstructive sleep apnea (adult) (pediatric): Secondary | ICD-10-CM | POA: Diagnosis not present

## 2019-03-14 DIAGNOSIS — G35 Multiple sclerosis: Secondary | ICD-10-CM | POA: Diagnosis not present

## 2019-03-21 DIAGNOSIS — H15121 Nodular episcleritis, right eye: Secondary | ICD-10-CM | POA: Diagnosis not present

## 2019-03-21 DIAGNOSIS — G35 Multiple sclerosis: Secondary | ICD-10-CM | POA: Diagnosis not present

## 2019-03-21 DIAGNOSIS — H5213 Myopia, bilateral: Secondary | ICD-10-CM | POA: Diagnosis not present

## 2019-03-21 DIAGNOSIS — H21561 Pupillary abnormality, right eye: Secondary | ICD-10-CM | POA: Diagnosis not present

## 2019-03-22 DIAGNOSIS — H15001 Unspecified scleritis, right eye: Secondary | ICD-10-CM | POA: Diagnosis not present

## 2019-03-22 DIAGNOSIS — M2550 Pain in unspecified joint: Secondary | ICD-10-CM | POA: Diagnosis not present

## 2019-03-22 DIAGNOSIS — G35 Multiple sclerosis: Secondary | ICD-10-CM | POA: Diagnosis not present

## 2019-04-10 DIAGNOSIS — H15001 Unspecified scleritis, right eye: Secondary | ICD-10-CM | POA: Diagnosis not present

## 2019-04-10 DIAGNOSIS — M2550 Pain in unspecified joint: Secondary | ICD-10-CM | POA: Diagnosis not present

## 2019-04-10 DIAGNOSIS — G4733 Obstructive sleep apnea (adult) (pediatric): Secondary | ICD-10-CM | POA: Diagnosis not present

## 2019-04-30 DIAGNOSIS — R509 Fever, unspecified: Secondary | ICD-10-CM | POA: Diagnosis not present

## 2019-04-30 DIAGNOSIS — R5383 Other fatigue: Secondary | ICD-10-CM | POA: Diagnosis not present

## 2019-04-30 DIAGNOSIS — M459 Ankylosing spondylitis of unspecified sites in spine: Secondary | ICD-10-CM | POA: Diagnosis not present

## 2019-04-30 DIAGNOSIS — G35 Multiple sclerosis: Secondary | ICD-10-CM | POA: Diagnosis not present

## 2019-05-01 DIAGNOSIS — R509 Fever, unspecified: Secondary | ICD-10-CM | POA: Diagnosis not present

## 2019-05-01 DIAGNOSIS — Z1152 Encounter for screening for COVID-19: Secondary | ICD-10-CM | POA: Diagnosis not present

## 2019-05-08 DIAGNOSIS — M791 Myalgia, unspecified site: Secondary | ICD-10-CM | POA: Diagnosis not present

## 2019-05-08 DIAGNOSIS — R509 Fever, unspecified: Secondary | ICD-10-CM | POA: Diagnosis not present

## 2019-05-08 DIAGNOSIS — Z79899 Other long term (current) drug therapy: Secondary | ICD-10-CM | POA: Diagnosis not present

## 2019-05-08 DIAGNOSIS — R5383 Other fatigue: Secondary | ICD-10-CM | POA: Diagnosis not present

## 2019-05-11 DIAGNOSIS — G4733 Obstructive sleep apnea (adult) (pediatric): Secondary | ICD-10-CM | POA: Diagnosis not present

## 2019-05-22 ENCOUNTER — Ambulatory Visit: Payer: BC Managed Care – PPO | Admitting: Neurology

## 2019-05-22 ENCOUNTER — Other Ambulatory Visit: Payer: Self-pay | Admitting: Family Medicine

## 2019-05-22 ENCOUNTER — Encounter: Payer: Self-pay | Admitting: Neurology

## 2019-05-22 ENCOUNTER — Other Ambulatory Visit: Payer: Self-pay

## 2019-05-22 VITALS — Temp 98.9°F | Ht 67.25 in | Wt 205.0 lb

## 2019-05-22 DIAGNOSIS — G35 Multiple sclerosis: Secondary | ICD-10-CM

## 2019-05-22 DIAGNOSIS — M459 Ankylosing spondylitis of unspecified sites in spine: Secondary | ICD-10-CM

## 2019-05-22 DIAGNOSIS — H469 Unspecified optic neuritis: Secondary | ICD-10-CM

## 2019-05-22 DIAGNOSIS — R3129 Other microscopic hematuria: Secondary | ICD-10-CM

## 2019-05-22 DIAGNOSIS — Z79899 Other long term (current) drug therapy: Secondary | ICD-10-CM

## 2019-05-22 DIAGNOSIS — R5383 Other fatigue: Secondary | ICD-10-CM | POA: Diagnosis not present

## 2019-05-22 MED ORDER — PHENTERMINE HCL 37.5 MG PO CAPS: 37.5000 mg | ORAL_CAPSULE | ORAL | 5 refills | Status: DC

## 2019-05-22 NOTE — Progress Notes (Signed)
[facility]  PATIENT: Brittney Harrison DOB: Feb 20, 1981  REFERRING DOCTOR OR PCP:  Farris Has (PCP); [facility] [phone] Neuro) SOURCE: Patient, notes from Dr. Renne Crigler, imaging and lab results, MRI images on PACS CD  _________________________________   HISTORICAL  CHIEF COMPLAINT:  Chief Complaint  Patient presents with  . Follow-up    RM 12, alone. Last seen 01/07/2019.   . Multiple Sclerosis    On Ocrevus. Received parts a and b 03/2019. She is feeling very fatigued and contrinues to have fevers. Went to PCP--WBC elevated and lymphocyte count elevated. Blood found in urine and referred to urologist, has not seen them yet.  . CPAP    DME: [facility]. States she is compliant with cpap.    HISTORY OF PRESENT ILLNESS:  Brittney Harrison Is a 39 year old woman diagnosed with relapsing remitting multiple sclerosis in 2014.  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  She received the first 2 part of her infusions in December 2020.  She has noted more fatigue and was found to have elevated white blood cell and neutrophil counts on recent blood work..  Urinalysis did not show UTI though she had some blood and is scheduled to see urology.    She had repeat bloodwork and WBC and neutrophils were back to normal.   She has had fever most of the last month.     She had leg weakness last September and also had increased pain in her spine.    Imaging showed no new MS lesions but there was some degenerative changeds at C7 that were new and enhanced.   She also has a rheumatologic disorder.     Since last visit, she has had a sleep study showing mild overall OSA but severe REM related OSA.  She started CPAP.  Download shows 77% greater than 4-hour compliance and the AHI is excellent at 1.3..   She notes less sleepiness on CPAP but is still tired.     She has had 4-5 other episodes of fever in the past  lasting a month or so.   She states inflammatory markers have been ok during those times.   Ibuprofen sometimes helps the fever some.   Tylenol can help.          Update 01/07/2019: She is not currently on a DMT.   She stopped Aubagio over a year ago due to diarrhea.   .   Copaxone was poorly tolerated.   MRI of the brain 11/2016 showed infratentorial and supratentorial lesions.  Due to her level of aggressiveness ,  I had wanted her to go on Ocrevus but Ocrevus was not approved for her despite appeal.   September 3rd, she had tingling from the hips down.  The next day her legs were wobbly and achy.  She had more fatigue.    She was better a few days later.   The next week, she was had a return of symptoms and also had gait disturbance..   The right leg seems worse than her left with more weakness and a tight sensation in her leg.   Her left leg seems weaker and the hip drops.    She has ankylosis spondylosis, previously on     I have spoken to Dr. Dierdre Forth [phone]), her rheumatologist, in the past.     Fatigue and sleepiness are much worse and she is needing to take longer naps.  She has witnessed OSA --- pause followed by gasp.     EPWORTH SLEEPINESS SCALE  On a scale of 0 - 3 what is the chance of dozing:  Sitting and Reading:   3 Watching TV:    2 Sitting inactive in a public place: 0 Passenger in car for one hour: 2 Lying down to rest in the afternoon: 3 Sitting and talking to someone: 1 Sitting quietly after lunch:  3 In a car, stopped in traffic:  0  Total (out of 24):    14/24 moderate excessive daytime sleepines   Update 06/23/2017: Ocrelizumab was not approved by her insurance.  Yesterday, the insurance company was called to find out what options would be approved.  They cover all 3 of the pills and Copaxone.  In the past she wason COpaxone and had a lot of skin reactions and does not think she could do it again.     She does note some new facial numbness that is bilateral over  the last few months -- it is fluctuating x a few hours.   She also had a few rounds of vertigo x one hour.    Other aspects of her MS are stable.  Specifically she notes no changes in her gait or strength.  She has some urinary frequency with rare incontinence.  She continues to have some fatigue.  She was hospitalized x 4 days for pneumonia, given IV antibiotics and is noe on Levaquin.     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    Prednisone seemed to be helping but is not good long-term.    Methotrexate is being considered    Update 01/23/2017:    She has not been on any DMT and her insurance company has not yet approved Ocrevus for her.  She has had more hand numbness and visual changes since the last visit.  Her left eye got painful and she had more black spots while driving to [facility].   She had optic neuritis in the past.    She felt vision was back to normal after a few hours.   She also had right finger numbness for 4 days.      Bladder is doing the same.     The brain MRI 12/09/2016 and was stable compared to her previous one a few years earlier.     She has a lot of trouble with fatigue. She has both physical and mental fatigue.  She gets occasional waves of lassitude, usually in the late afternoons.    She sleeps well.   She has noted more stress but this is better than a few weeks ago.   She has noted more trouble with recall.    She gets some unusual spell of distorted time perception while moving where she feels her brian is moving fast but she is moving slow.   This may last x 5-10 minutes and there will be several spells in the same day.  This occurs once a month.    _______________________________________ From 10/13/2016:  In May 2002, she had thoracic dysesthesias.   In 2003, while at college, she had severe fatigue and droppey d out.    In  2004, she had a episode of right leg numbness x 3 weeks.   It built up over a period of one day.   She had no  insurance and improved so did not seek medical opinion.   In 2005, she had more right leg issues and in 2006 had right hand numbness.    Fatigue was also worse.   She also began to note some milder cognitive issues in 2006 that worsened in 2008.  She had severe left leg cramping in 2007 associated with some weakness for a while.  She also noted right facial symptoms in 2008.     In October 2014, she had the onset of black spots in her vision.  An MRI was performed worrisome for MS and an LP showed CSF consistent with MS.   She also had more urinary incontinence around that time.    Initially, she saw Dr. Clarisse Gouge (who also saw her sister with IIH).   He referred her to Dr. Renne Crigler who started her on Copaxone.   She stopped Copaxone a year later for pregnancy and did not restart after delivery June 2016 due to needle fatigue.    Gait/strength/sensation:  She occasionally feels off balanced but has no recent falls and feels gait is usually fine.    She has a tight sensation in her toes bilaterally, left worse than right.  She denies weakness.   She has had 'zingers' in her neck since 2010 with flexion with symptoms going down the right arm.      Vision:   She notes visual blurring out of the left eye. She still sees black spots in her vision.   Color vision is not affected.  She denies diplopia.    Bladder:  She has had stress incontinence since her first pregnancy.   She has had some spells of incontinence in 2009 before any pregnancy and also had some last year.   Fatigue/sleep:  She is tired daily since 2008, regardless of the duration or quality of her sleep.   Some days seem worse.   Fatigue is worse with heat and as the day goes on.   She tried Ritalin which helped her brian fog more than the fatigue.   She sleeps 6-8 hours nightly depending on her kids.  She has mild sleep onset insomnia but once  asleep she stays asleep.  Cognitive:   She reports a mental fog since 2005.   She notes worsening word finding issues and decreased focus and some forgetfulness.  Mood:    She reports some depression since December that seemed to come out of nowhere.   She feels better this month than a few months ago but not to baseline.    She has had anxiety x years and feels this is stable.   She worries a lot, in general.     FH:   Two distant cousins have MS.     Two family members has had ALS a swell and several have fibromyalgia.  Her 12 yo grandmoither has dementia.    I personally reviewed the MRIs of the brain dated 08/15/2013 and 11/12/2014 and the MRI of the cervical spine 02/21/2013. The MRIs of the brain show a stable pattern of T2/FLAIR hyperintense foci in the periventricular, juxtacortical and deep white matter consistent with MS. There is also a focus in the right cerebral peduncle. The brain stem appear normal. None of the foci enhanced. The MRI of the cervical spine shows a small posterior focus adjacent to C5-C6.  I also reviewed laboratory and studies from 2014. Visual, brain stem and somatosensory evoked responses were normal. According to a note from Dr. Renne Crigler, CSF  was abnormal showing greater than 5 oligoclonal bands.  REVIEW OF SYSTEMS: Constitutional: No fevers, chills, sweats, or change in appetite.  She notes fatigue. Eyes: No visual changes, double vision, eye pain Ear, nose and throat: No hearing loss, ear pain, nasal congestion, sore throat Cardiovascular: No chest pain, palpitations Respiratory: No shortness of breath at rest or with exertion.   No wheezes GastrointestinaI: No nausea, vomiting, diarrhea, abdominal pain, fecal incontinence Genitourinary: No dysuria, urinary retention or frequency.  No nocturia. Musculoskeletal: No neck pain, back pain Integumentary: No rash, pruritus, skin lesions Neurological: as above Psychiatric: No depression at this time.  No  anxiety Endocrine: No palpitations, diaphoresis, change in appetite, change in weigh or increased thirst Hematologic/Lymphatic: No anemia, purpura, petechiae. Allergic/Immunologic: No itchy/runny eyes, nasal congestion, recent allergic reactions, rashes  ALLERGIES: Allergies  Allergen Reactions  . Codeine Nausea And Vomiting  . Eye-Sed Ophthalmic [Zinc] Itching    Eye gel   . Percocet [Oxycodone-Acetaminophen] Nausea And Vomiting    HOME MEDICATIONS:  Current Outpatient Medications:  .  albuterol (PROAIR HFA) 108 (90 Base) MCG/ACT inhaler, as needed., Disp: , Rfl:  .  cetirizine (ZYRTEC) 10 MG tablet, Take 10 mg by mouth daily., Disp: , Rfl:  .  Cholecalciferol (VITAMIN D PO), Take 1 tablet by mouth daily., Disp: , Rfl:  .  escitalopram (LEXAPRO) 20 MG tablet, Take 1 tablet (20 mg total) by mouth daily., Disp: 90 tablet, Rfl: 0 .  hydrOXYzine (ATARAX/VISTARIL) 50 MG tablet, Take 1 tablet (50 mg total) by mouth as needed., Disp: 30 tablet, Rfl: 1 .  naproxen sodium (ALEVE) 220 MG tablet, Take 220 mg by mouth. 2-4 tablets daily, Disp: , Rfl:  .  ocrelizumab (OCREVUS) 300 MG/10ML injection, Inject into the vein once., Disp: , Rfl:  .  Vitamin D, Ergocalciferol, (DRISDOL) 1.25 MG (50000 UNIT) CAPS capsule, Take 50,000 Units by mouth every 7 (seven) days., Disp: , Rfl:  .  phentermine 37.5 MG capsule, Take 1 capsule (37.5 mg total) by mouth every morning., Disp: 30 capsule, Rfl: 5  PAST MEDICAL HISTORY: Past Medical History:  Diagnosis Date  . Abnormal Pap smear 2007   CIN-1  . 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 Asthma   . 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 Clitoral irritation 07/2006  . Complication of anesthesia    severe vomiting  . Fatigue   . Fibrocystic breast 2007  . Fullness of breast 01/2007   right  . GERD (gastroesophageal reflux disease)   . H/O seasonal allergies   . H/O varicella   . Headache(784.0)    migraines   . Hx: UTI (urinary  tract infection)   . Increased BMI   . Irregular bleeding 10/2005  . Multiple sclerosis (HCC)   . Thyroid disease    Fluxuate between hyper to hypo  . 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 Vision abnormalities   . Yeast vaginitis 07/2006    PAST SURGICAL HISTORY: Past Surgical History:  Procedure Laterality Date  . CHOLECYSTECTOMY  09/11/91  . DILATION AND CURETTAGE OF UTERUS  2011   endometritis  . DILATION AND CURETTAGE OF UTERUS  11/19/2011   Procedure: DILATATION AND CURETTAGE;  Surgeon: Hal Morales, MD;  Location: [facility];  Service: Gynecology;  Laterality: N/A;  dilitation and currettage with repair of intraoperative cervical laceration.  . TONSILLECTOMY  10/28/97  . WISDOM TOOTH EXTRACTION  2001    FAMILY HISTORY: Family History  Problem Relation Age of Onset  .  Heart disease Father   . Alcohol abuse Father   . Melanoma Father   . Cancer Maternal Aunt        Thyroid & breast  . Cancer Paternal Uncle        Esophageal  . Diabetes Maternal Grandmother   . Cancer Maternal Grandmother        Kidney  . Cancer Paternal Grandmother   . Hypothyroidism Mother   . Goiter Mother   . Multiple sclerosis Cousin     SOCIAL HISTORY:  Social History   Socioeconomic History  . Marital status: Divorced    Spouse name: Not on file  . Number of children: Not on file  . Years of education: Not on file  . Highest education level: Not on file  Occupational History  . Not on file  Tobacco Use  . Smoking status: Current Every Day Smoker    Packs/day: 1.50    Years: 10.00    Pack years: 15.00    Types: Cigarettes    Last attempt to quit: 02/13/2011    Years since quitting: 8.2  . Smokeless tobacco: Never Used  . Tobacco comment: Has quit 3 times this year but reports due to anxiety now  Substance and Sexual Activity  . Alcohol use: Yes    Alcohol/week: 2.0 standard drinks    Types: 2 Cans of beer per week  . Drug use: No  . Sexual activity: Yes    Partners: Male    Birth  control/protection: I.U.D.  Other Topics Concern  . Not on file  Social History Narrative  . Not on file   Social Determinants of Health   Financial Resource Strain:   . Difficulty of Paying Living Expenses: Not on file  Food Insecurity:   . Worried About Charity fundraiser in the Last Year: Not on file  . Ran Out of Food in the Last Year: Not on file  Transportation Needs:   . Lack of Transportation (Medical): Not on file  . Lack of Transportation (Non-Medical): Not on file  Physical Activity:   . Days of Exercise per Week: Not on file  . Minutes of Exercise per Session: Not on file  Stress:   . Feeling of Stress : Not on file  Social Connections:   . Frequency of Communication with Friends and Family: Not on file  . Frequency of Social Gatherings with Friends and Family: Not on file  . Attends Religious Services: Not on file  . Active Member of Clubs or Organizations: Not on file  . Attends Archivist Meetings: Not on file  . Marital Status: Not on file  Intimate Partner Violence:   . Fear of Current or Ex-Partner: Not on file  . Emotionally Abused: Not on file  . Physically Abused: Not on file  . Sexually Abused: Not on file     PHYSICAL EXAM  Vitals:   05/22/19 1256  Temp: 98.9 F (37.2 C)  Weight: 205 lb (93 kg)  Height: 5' 7.25" (1.708 m)    Body mass index is 31.87 kg/m.   General: The patient is well-developed and well-nourished and in no acute distress.  She has reduced range of motion in the neck.  Mild tenderness in the lower lumbar spine   Neurologic Exam  Mental status: The patient is alert and oriented x 3 at the time of the examination. The patient has apparent normal recent and remote memory, with an apparently normal attention span and concentration ability.  Speech is normal.  Cranial nerves: Extraocular movements are full.  Symmetric color vidsion   There is good facial sensation to soft touch bilaterally.Facial strength is  normal.  Trapezius and sternocleidomastoid strength is normal. No dysarthria is noted.   No obvious hearing deficits are noted.  Motor:  Muscle bulk is normal.   Muscle tone is normal her strength is 5/5 in the arms and legs now.    Sensory: She has intact touch sensation in the arms but mildly reduced sensation in left leg to touch. .     Coordination: Cerebellar testing shows good finger-nose-finger but she has reduced heel-to-shin, left worse than right.  Gait and station: Station is normal.   Her gait is slightly wide and tandem is mildly wide   Reflexes: Deep tendon reflexes are normal in the arms but increased at knees and ankles, left > right.    No ankle clonus      DIAGNOSTIC DATA (LABS, IMAGING, TESTING) - I reviewed patient records, labs, notes, testing and imaging myself where available.  Lab Results  Component Value Date   WBC 10.7 01/07/2019   HGB 15.6 01/07/2019   HCT 45.1 01/07/2019   MCV 91 01/07/2019   PLT 251 01/07/2019        ASSESSMENT AND PLAN   Multiple sclerosis (HCC)  Left optic neuritis  Other fatigue  Ankylosing spondylitis, unspecified site of spine (HCC)  High risk medication use   1.    Continue Ocrevus as it should help as DMT for MS and may also have benefit for her rheumatologic issues.   2.    Continue to be active and exercise as tolerated. 3.    Phentermine for fatigue and weight gain.   If psychiatry writes a stimulant, she should stop the phentermine 4.   She will return to see me in 4-5 months or sooner if there are new or worsening neurologic symptoms.  Shakyia Bosso A. Epimenio Foot, MD, [facility] 05/22/2019, 1:44 PM Certified in Neurology, Clinical Neurophysiology, Sleep Medicine, Pain Medicine and Neuroimaging  [facility] [address] [place], [place] [address] [phone]

## 2019-06-07 DIAGNOSIS — Z23 Encounter for immunization: Secondary | ICD-10-CM | POA: Diagnosis not present

## 2019-06-07 DIAGNOSIS — R3121 Asymptomatic microscopic hematuria: Secondary | ICD-10-CM | POA: Diagnosis not present

## 2019-06-07 DIAGNOSIS — N393 Stress incontinence (female) (male): Secondary | ICD-10-CM | POA: Diagnosis not present

## 2019-06-09 DIAGNOSIS — G4733 Obstructive sleep apnea (adult) (pediatric): Secondary | ICD-10-CM | POA: Diagnosis not present

## 2019-06-13 ENCOUNTER — Ambulatory Visit
Admission: RE | Admit: 2019-06-13 | Discharge: 2019-06-13 | Disposition: A | Discharge status: Home / Self Care | Payer: BC Managed Care – PPO | Source: Ambulatory Visit | Attending: Family Medicine | Admitting: Family Medicine

## 2019-06-13 DIAGNOSIS — R3129 Other microscopic hematuria: Secondary | ICD-10-CM

## 2019-06-13 DIAGNOSIS — N200 Calculus of kidney: Secondary | ICD-10-CM | POA: Diagnosis not present

## 2019-06-13 MED ORDER — IOPAMIDOL (ISOVUE-300) INJECTION 61%
150.0000 mL | Freq: Once | INTRAVENOUS | Status: AC | PRN
  Administered 2019-06-13: 13:00:00 150 mL via INTRAVENOUS

## 2019-06-18 DIAGNOSIS — G4733 Obstructive sleep apnea (adult) (pediatric): Secondary | ICD-10-CM | POA: Diagnosis not present

## 2019-06-20 DIAGNOSIS — R3121 Asymptomatic microscopic hematuria: Secondary | ICD-10-CM | POA: Diagnosis not present

## 2019-06-20 DIAGNOSIS — N393 Stress incontinence (female) (male): Secondary | ICD-10-CM | POA: Diagnosis not present

## 2019-06-20 DIAGNOSIS — N200 Calculus of kidney: Secondary | ICD-10-CM | POA: Diagnosis not present

## 2019-07-05 DIAGNOSIS — Z23 Encounter for immunization: Secondary | ICD-10-CM | POA: Diagnosis not present

## 2019-07-09 DIAGNOSIS — G4733 Obstructive sleep apnea (adult) (pediatric): Secondary | ICD-10-CM | POA: Diagnosis not present

## 2019-08-09 DIAGNOSIS — G4733 Obstructive sleep apnea (adult) (pediatric): Secondary | ICD-10-CM | POA: Diagnosis not present

## 2019-08-14 ENCOUNTER — Encounter: Payer: Self-pay | Admitting: Neurology

## 2019-08-14 ENCOUNTER — Other Ambulatory Visit: Payer: Self-pay

## 2019-08-14 ENCOUNTER — Ambulatory Visit: Payer: BC Managed Care – PPO | Admitting: Neurology

## 2019-08-14 VITALS — BP 118/70 | HR 108 | Temp 97.6°F | Ht 67.25 in | Wt 204.0 lb

## 2019-08-14 DIAGNOSIS — R509 Fever, unspecified: Secondary | ICD-10-CM | POA: Diagnosis not present

## 2019-08-14 DIAGNOSIS — G35 Multiple sclerosis: Secondary | ICD-10-CM | POA: Diagnosis not present

## 2019-08-14 DIAGNOSIS — M459 Ankylosing spondylitis of unspecified sites in spine: Secondary | ICD-10-CM | POA: Diagnosis not present

## 2019-08-14 DIAGNOSIS — Z79899 Other long term (current) drug therapy: Secondary | ICD-10-CM | POA: Diagnosis not present

## 2019-08-14 NOTE — Progress Notes (Signed)
[facility]  PATIENT: Brittney Harrison DOB: 1980-08-31  REFERRING DOCTOR OR PCP:  London Pepper (PCP); Ala Bent [phone] Neuro) SOURCE: Patient, notes from Dr. Shelia Media, imaging and lab results, MRI images on PACS CD  _________________________________   HISTORICAL  CHIEF COMPLAINT:  Chief Complaint  Patient presents with  . Follow-up    RM 13, alone. Last seen 05/22/2019.   . Multiple Sclerosis    On Ocrevus. She is having chronic fevers and feels it is r/t Ocrevus. Her next infusion is scheduled for 10/03/19.     HISTORY OF PRESENT ILLNESS:  Brittney Harrison Is a 39 year old woman diagnosed with relapsing remitting multiple sclerosis in 2014.  She also has a rheumologic disorder (likely ankylosing spondylosis)  Update 08/14/19 She is having low grade fevers.   This started December 15, 10 days after her infusion of Ocrevus.  She saw rheumatology a week later and her inflammatory markers were all fine.    ESR and CRP and ANCA were fine, WBC increased (neurtrophils).  LAst WBC a couple months ago was back to normal.   NSAID's and acetaminophen sometimes reduce the fever and sometime don't .  She has ankylosis spondylosis and hydradenitis suppurativa.   She had once been C-ANCA positive     Phentermine has helped her fatigue.     Before Ocrevus, she ws on Aubagio but had diarrhea.   She was also on Copaxone in the past.    MRI of the brain and spine 01/2019 showed one focus at C6 and foci in the cerebellum,peduncles and hemispheres.    Update 05/22/2019: For MS, she is on Ocrevus.  She received the first 2 part of her infusions in December 2020.  She has noted more fatigue and was found to have elevated white blood cell and neutrophil counts on recent blood work..  Urinalysis did not show UTI though she had some blood and is scheduled to see urology.    She had repeat bloodwork and WBC and neutrophils were back to normal.   She has had fever most of the last month.     She had  leg weakness last September and also had increased pain in her spine.    Imaging showed no new MS lesions but there was some degenerative changeds at C7 that were new and enhanced.   She also has a rheumatologic disorder.     Since last visit, she has had a sleep study showing mild overall OSA but severe REM related OSA.  She started CPAP.  Download shows 77% greater than 4-hour compliance and the AHI is excellent at 1.3..   She notes less sleepiness on CPAP but is still tired.     She has had 4-5 other episodes of fever in the past lasting a month or so.   She states inflammatory markers have been ok during those times.   Ibuprofen sometimes helps the fever some.   Tylenol can help.          Update 01/07/2019: She is not currently on a DMT.   She stopped Aubagio over a year ago due to diarrhea.   .   Copaxone was poorly tolerated.   MRI of the brain 11/2016 showed infratentorial and supratentorial lesions.  Due to her level of aggressiveness ,  I had wanted her to go on Ocrevus but Ocrevus was not approved for her despite appeal.   September 3rd, she had tingling from the hips down.  The next day her legs were  wobbly and achy.  She had more fatigue.    She was better a few days later.   The next week, she was had a return of symptoms and also had gait disturbance..   The right leg seems worse than her left with more weakness and a tight sensation in her leg.   Her left leg seems weaker and the hip drops.    She has ankylosis spondylosis, previously on     I have spoken to Dr. Amil Amen [phone]), her rheumatologist, in the past.     Fatigue and sleepiness are much worse and she is needing to take longer naps.   She has witnessed OSA --- pause followed by gasp.     EPWORTH SLEEPINESS SCALE  On a scale of 0 - 3 what is the chance of dozing:  Sitting and Reading:   3 Watching TV:    2 Sitting inactive in a public place: 0 Passenger in car for one hour: 2 Lying down to rest in the afternoon: 3  Sitting and talking to someone: 1 Sitting quietly after lunch:  3 In a car, stopped in traffic:  0  Total (out of 24):    14/24 moderate excessive daytime sleepines   Update 06/23/2017: Ocrelizumab was not approved by her insurance.  Yesterday, the insurance company was called to find out what options would be approved.  They cover all 3 of the pills and Copaxone.  In the past she wason COpaxone and had a lot of skin reactions and does not think she could do it again.     She does note some new facial numbness that is bilateral over the last few months -- it is fluctuating x a few hours.   She also had a few rounds of vertigo x one hour.    Other aspects of her MS are stable.  Specifically she notes no changes in her gait or strength.  She has some urinary frequency with rare incontinence.  She continues to have some fatigue.  She was hospitalized x 4 days for pneumonia, given IV antibiotics and is noe on Levaquin.     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    Prednisone seemed to be helping but is not good long-term.    Methotrexate is being considered    Update 01/23/2017:    She has not been on any DMT and her insurance company has not yet approved Ocrevus for her.  She has had more hand numbness and visual changes since the last visit.  Her left eye got painful and she had more black spots while driving to [facility].   She had optic neuritis in the past.    She felt vision was back to normal after a few hours.   She also had right finger numbness for 4 days.      Bladder is doing the same.     The brain MRI 12/09/2016 and was stable compared to her previous one a few years earlier.     She has a lot of trouble with fatigue. She has both physical and mental fatigue.  She gets occasional waves of lassitude, usually in the late afternoons.    She sleeps  well.   She has noted more stress but this is better than a few weeks ago.   She has noted more trouble with recall.  She gets some unusual spell of distorted time perception while moving where she feels her brian is moving fast but she is moving slow.   This may last x 5-10 minutes and there will be several spells in the same day.  This occurs once a month.    _______________________________________ From 10/13/2016:  In May 2002, she had thoracic dysesthesias.   In 2003, while at college, she had severe fatigue and droppey d out.    In 2004, she had a episode of right leg numbness x 3 weeks.   It built up over a period of one day.   She had no insurance and improved so did not seek medical opinion.   In 2005, she had more right leg issues and in 2006 had right hand numbness.    Fatigue was also worse.   She also began to note some milder cognitive issues in 2006 that worsened in 2008.  She had severe left leg cramping in 2007 associated with some weakness for a while.  She also noted right facial symptoms in 2008.     In October 2014, she had the onset of black spots in her vision.  An MRI was performed worrisome for MS and an LP showed CSF consistent with MS.   She also had more urinary incontinence around that time.    Initially, she saw Dr. Catalina Gravel (who also saw her sister with IIH).   He referred her to Dr. Shelia Media who started her on Copaxone.   She stopped Copaxone a year later for pregnancy and did not restart after delivery June 2016 due to needle fatigue.    Gait/strength/sensation:  She occasionally feels off balanced but has no recent falls and feels gait is usually fine.    She has a tight sensation in her toes bilaterally, left worse than right.  She denies weakness.   She has had 'zingers' in her neck since 2010 with flexion with symptoms going down the right arm.      Vision:   She notes visual blurring out of the left eye. She still sees black spots in her vision.   Color vision is not  affected.  She denies diplopia.    Bladder:  She has had stress incontinence since her first pregnancy.   She has had some spells of incontinence in 2009 before any pregnancy and also had some last year.   Fatigue/sleep:  She is tired daily since 2008, regardless of the duration or quality of her sleep.   Some days seem worse.   Fatigue is worse with heat and as the day goes on.   She tried Ritalin which helped her brian fog more than the fatigue.   She sleeps 6-8 hours nightly depending on her kids.  She has mild sleep onset insomnia but once asleep she stays asleep.  Cognitive:   She reports a mental fog since 2005.   She notes worsening word finding issues and decreased focus and some forgetfulness.  Mood:    She reports some depression since December that seemed to come out of nowhere.   She feels better this month than a few months ago but not to baseline.    She has had anxiety x years and feels this is stable.   She worries a lot, in general.     FH:   Two distant cousins have MS.     Two family members has had ALS a swell and several have fibromyalgia.  Her 61 yo grandmoither  has dementia.    I personally reviewed the MRIs of the brain dated 08/15/2013 and 11/12/2014 and the MRI of the cervical spine 02/21/2013. The MRIs of the brain show a stable pattern of T2/FLAIR hyperintense foci in the periventricular, juxtacortical and deep white matter consistent with MS. There is also a focus in the right cerebral peduncle. The brain stem appear normal. None of the foci enhanced. The MRI of the cervical spine shows a small posterior focus adjacent to C5-C6.  I also reviewed laboratory and studies from 2014. Visual, brain stem and somatosensory evoked responses were normal. According to a note from Dr. Shelia Media, CSF was abnormal showing greater than 5 oligoclonal bands.  REVIEW OF SYSTEMS: Constitutional: No fevers, chills, sweats, or change in appetite.  She notes fatigue. Eyes: No visual changes,  double vision, eye pain Ear, nose and throat: No hearing loss, ear pain, nasal congestion, sore throat Cardiovascular: No chest pain, palpitations Respiratory: No shortness of breath at rest or with exertion.   No wheezes GastrointestinaI: No nausea, vomiting, diarrhea, abdominal pain, fecal incontinence Genitourinary: No dysuria, urinary retention or frequency.  No nocturia. Musculoskeletal: No neck pain, back pain Integumentary: No rash, pruritus, skin lesions Neurological: as above Psychiatric: No depression at this time.  No anxiety Endocrine: No palpitations, diaphoresis, change in appetite, change in weigh or increased thirst Hematologic/Lymphatic: No anemia, purpura, petechiae. Allergic/Immunologic: No itchy/runny eyes, nasal congestion, recent allergic reactions, rashes  ALLERGIES: Allergies  Allergen Reactions  . Codeine Nausea And Vomiting  . Eye-Sed Ophthalmic [Zinc] Itching    Eye gel   . Percocet [Oxycodone-Acetaminophen] Nausea And Vomiting    HOME MEDICATIONS:  Current Outpatient Medications:  .  albuterol (PROAIR HFA) 108 (90 Base) MCG/ACT inhaler, as needed., Disp: , Rfl:  .  cetirizine (ZYRTEC) 10 MG tablet, Take 10 mg by mouth daily., Disp: , Rfl:  .  Cholecalciferol (VITAMIN D PO), Take 5,000 Units by mouth daily. , Disp: , Rfl:  .  escitalopram (LEXAPRO) 20 MG tablet, Take 1 tablet (20 mg total) by mouth daily., Disp: 90 tablet, Rfl: 0 .  hydrOXYzine (ATARAX/VISTARIL) 50 MG tablet, Take 1 tablet (50 mg total) by mouth as needed., Disp: 30 tablet, Rfl: 1 .  naproxen sodium (ALEVE) 220 MG tablet, Take 220 mg by mouth. 2-4 tablets daily, Disp: , Rfl:  .  ocrelizumab (OCREVUS) 300 MG/10ML injection, Inject into the vein once., Disp: , Rfl:  .  phentermine 37.5 MG capsule, Take 1 capsule (37.5 mg total) by mouth every morning., Disp: 30 capsule, Rfl: 5  PAST MEDICAL HISTORY: Past Medical History:  Diagnosis Date  . Abnormal Pap smear 2007   CIN-1  . ASCUS  with positive high risk HPV 05/2005  . Asthma   . 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 Clitoral irritation 07/2006  . Complication of anesthesia    severe vomiting  . Fatigue   . Fibrocystic breast 2007  . Fullness of breast 01/2007   right  . GERD (gastroesophageal reflux disease)   . H/O seasonal allergies   . H/O varicella   . Headache(784.0)    migraines   . Hx: UTI (urinary tract infection)   . Increased BMI   . Irregular bleeding 10/2005  . Multiple sclerosis ([place])   . Thyroid disease    Fluxuate between hyper to hypo  . 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 Vision abnormalities   . Yeast vaginitis 07/2006    PAST SURGICAL HISTORY: Past Surgical History:  Procedure Laterality  Date  . CHOLECYSTECTOMY  09/11/91  . DILATION AND CURETTAGE OF UTERUS  2011   endometritis  . DILATION AND CURETTAGE OF UTERUS  11/19/2011   Procedure: DILATATION AND CURETTAGE;  Surgeon: Eldred Manges, MD;  Location: [facility];  Service: Gynecology;  Laterality: N/A;  dilitation and currettage with repair of intraoperative cervical laceration.  . TONSILLECTOMY  10/28/97  . WISDOM TOOTH EXTRACTION  2001    FAMILY HISTORY: Family History  Problem Relation Age of Onset  . Heart disease Father   . Alcohol abuse Father   . Melanoma Father   . Cancer Maternal Aunt        Thyroid & breast  . Cancer Paternal Uncle        Esophageal  . Diabetes Maternal Grandmother   . Cancer Maternal Grandmother        Kidney  . Cancer Paternal Grandmother   . Hypothyroidism Mother   . Goiter Mother   . Multiple sclerosis Cousin     SOCIAL HISTORY:  Social History   Socioeconomic History  . Marital status: Divorced    Spouse name: Not on file  . Number of children: Not on file  . Years of education: Not on file  . Highest education level: Not on file  Occupational History  . Not on file  Tobacco Use  . Smoking status: Current Every Day Smoker    Packs/day: 1.50    Years: 10.00    Pack years:  15.00    Types: Cigarettes    Last attempt to quit: 02/13/2011    Years since quitting: 8.5  . Smokeless tobacco: Never Used  . Tobacco comment: Has quit 3 times this year but reports due to anxiety now  Substance and Sexual Activity  . Alcohol use: Yes    Alcohol/week: 2.0 standard drinks    Types: 2 Cans of beer per week  . Drug use: No  . Sexual activity: Yes    Partners: Male    Birth control/protection: I.U.D.  Other Topics Concern  . Not on file  Social History Narrative  . Not on file   Social Determinants of Health   Financial Resource Strain:   . Difficulty of Paying Living Expenses:   Food Insecurity:   . Worried About Charity fundraiser in the Last Year:   . Arboriculturist in the Last Year:   Transportation Needs:   . Film/video editor (Medical):   [name] Lack of Transportation (Non-Medical):   Physical Activity:   . Days of Exercise per Week:   . Minutes of Exercise per Session:   Stress:   . Feeling of Stress :   Social Connections:   . Frequency of Communication with Friends and Family:   . Frequency of Social Gatherings with Friends and Family:   . Attends Religious Services:   . Active Member of Clubs or Organizations:   . Attends Archivist Meetings:   [name] Marital Status:   Intimate Partner Violence:   . Fear of Current or Ex-Partner:   . Emotionally Abused:   [name] Physically Abused:   . Sexually Abused:      PHYSICAL EXAM  Vitals:   08/14/19 1455  BP: 118/70  Pulse: (!) 108  Temp: 97.6 F (36.4 C)  SpO2: 98%  Weight: 204 lb (92.5 kg)  Height: 5' 7.25" (1.708 m)    Body mass index is 31.71 kg/m.   General: The patient is well-developed and well-nourished and in  no acute distress.  She has reduced range of motion in the neck.  Mild tenderness in the lower lumbar spine   Neurologic Exam  Mental status: The patient is alert and oriented x 3 at the time of the examination. The patient has apparent normal recent and remote  memory, with an apparently normal attention span and concentration ability.   Speech is normal.  Cranial nerves: Extraocular movements are full.  Color vision is symmetric.  Facial sensation is normal.  Facial strength is normal.  Trapezius and sternocleidomastoid strength is normal. No dysarthria is noted.   No obvious hearing deficits are noted.  Motor:  Muscle bulk is normal.   Muscle tone is normal her strength is 5/5 in the arms and legs now.    Sensory: She has intact touch sensation in the arms but mildly reduced sensation in left leg to touch. .     Coordination: Cerebellar testing shows good finger-nose-finger but she has reduced heel-to-shin, left worse than right.  Gait and station: Station is normal.   The gait is fairly normal and the tandem gait is mildly wide.  Reflexes: Deep tendon reflexes are normal in the arms but increased at knees and ankles, left > right.    No ankle clonus      DIAGNOSTIC DATA (LABS, IMAGING, TESTING) - I reviewed patient records, labs, notes, testing and imaging myself where available.  Lab Results  Component Value Date   WBC 10.7 01/07/2019   HGB 15.6 01/07/2019   HCT 45.1 01/07/2019   MCV 91 01/07/2019   PLT 251 01/07/2019        ASSESSMENT AND PLAN   Multiple sclerosis ([place]) - Plan: IgG, IgA, IgM, CBC with Differential/Platelet  Ankylosing spondylitis, unspecified site of spine (HCC)  High risk medication use - Plan: IgG, IgA, IgM, CBC with Differential/Platelet  Fever, unspecified fever cause   1.    Due to continued fevers, she would like to try a different medication.    I discussed Cosentyx (secukinumab) --- it was actually trialled in a phase 2 with some efficacy.  I discussed with her that the efficacy for MS is likely lower than Ocrevus but her MS has not been aggressive and this treatment would hopefully keep her MS under control.  Will discuss with Dr. Amil Amen.  2.    Continue to be active and exercise as tolerated.      3.    Continue phentermine for fatigue and weight gain.   4.   She will return to see me in 4-5 months or sooner if there are new or worsening neurologic symptoms.  Richard A. Felecia Shelling, MD, [facility] 12/17/4208, 3:12 PM Certified in Neurology, Clinical Neurophysiology, Sleep Medicine, Pain Medicine and Neuroimaging  [facility] [address] [place], [place] [address] [phone]

## 2019-08-15 LAB — CBC WITH DIFFERENTIAL/PLATELET
Basophils Absolute: 0.1 10*3/uL (ref 0.0–0.2)
Basos: 1 %
EOS (ABSOLUTE): 0.3 10*3/uL (ref 0.0–0.4)
Eos: 2 %
Hematocrit: 46.5 % (ref 34.0–46.6)
Hemoglobin: 16 g/dL — ABNORMAL HIGH (ref 11.1–15.9)
Immature Grans (Abs): 0.1 10*3/uL (ref 0.0–0.1)
Immature Granulocytes: 1 %
Lymphocytes Absolute: 3 10*3/uL (ref 0.7–3.1)
Lymphs: 25 %
MCH: 31.4 pg (ref 26.6–33.0)
MCHC: 34.4 g/dL (ref 31.5–35.7)
MCV: 91 fL (ref 79–97)
Monocytes Absolute: 0.6 10*3/uL (ref 0.1–0.9)
Monocytes: 5 %
Neutrophils Absolute: 7.8 10*3/uL — ABNORMAL HIGH (ref 1.4–7.0)
Neutrophils: 66 %
Platelets: 260 10*3/uL (ref 150–450)
RBC: 5.1 x10E6/uL (ref 3.77–5.28)
RDW: 12.9 % (ref 11.7–15.4)
WBC: 11.8 10*3/uL — ABNORMAL HIGH (ref 3.4–10.8)

## 2019-08-15 LAB — IGG, IGA, IGM
IgA/Immunoglobulin A, Serum: 179 mg/dL (ref 87–352)
IgG (Immunoglobin G), Serum: 966 mg/dL (ref 586–1602)
IgM (Immunoglobulin M), Srm: 65 mg/dL (ref 26–217)

## 2019-08-15 NOTE — Telephone Encounter (Signed)
Notified our infusion suite that pt will be stopping Ocrevus.

## 2019-09-08 DIAGNOSIS — G4733 Obstructive sleep apnea (adult) (pediatric): Secondary | ICD-10-CM | POA: Diagnosis not present

## 2019-09-20 DIAGNOSIS — G35 Multiple sclerosis: Secondary | ICD-10-CM | POA: Diagnosis not present

## 2019-09-20 DIAGNOSIS — M450 Ankylosing spondylitis of multiple sites in spine: Secondary | ICD-10-CM | POA: Diagnosis not present

## 2019-09-20 DIAGNOSIS — M2550 Pain in unspecified joint: Secondary | ICD-10-CM | POA: Diagnosis not present

## 2019-09-20 DIAGNOSIS — H15001 Unspecified scleritis, right eye: Secondary | ICD-10-CM | POA: Diagnosis not present

## 2019-10-08 DIAGNOSIS — E559 Vitamin D deficiency, unspecified: Secondary | ICD-10-CM | POA: Diagnosis not present

## 2019-10-08 DIAGNOSIS — R5383 Other fatigue: Secondary | ICD-10-CM | POA: Diagnosis not present

## 2019-10-09 DIAGNOSIS — G4733 Obstructive sleep apnea (adult) (pediatric): Secondary | ICD-10-CM | POA: Diagnosis not present

## 2019-10-26 DIAGNOSIS — F4312 Post-traumatic stress disorder, chronic: Secondary | ICD-10-CM | POA: Diagnosis not present

## 2019-11-08 DIAGNOSIS — G4733 Obstructive sleep apnea (adult) (pediatric): Secondary | ICD-10-CM | POA: Diagnosis not present

## 2019-11-24 DIAGNOSIS — F4312 Post-traumatic stress disorder, chronic: Secondary | ICD-10-CM | POA: Diagnosis not present

## 2019-11-25 ENCOUNTER — Encounter: Payer: Self-pay | Admitting: Neurology

## 2019-11-25 ENCOUNTER — Ambulatory Visit: Payer: BC Managed Care – PPO | Admitting: Neurology

## 2019-11-25 VITALS — BP 137/86 | HR 82 | Ht 67.25 in | Wt 208.5 lb

## 2019-11-25 DIAGNOSIS — G35 Multiple sclerosis: Secondary | ICD-10-CM

## 2019-11-25 DIAGNOSIS — Z79899 Other long term (current) drug therapy: Secondary | ICD-10-CM | POA: Diagnosis not present

## 2019-11-25 DIAGNOSIS — R5383 Other fatigue: Secondary | ICD-10-CM

## 2019-11-25 DIAGNOSIS — G4489 Other headache syndrome: Secondary | ICD-10-CM | POA: Insufficient documentation

## 2019-11-25 DIAGNOSIS — M459 Ankylosing spondylitis of unspecified sites in spine: Secondary | ICD-10-CM | POA: Diagnosis not present

## 2019-11-25 MED ORDER — INDOMETHACIN 25 MG PO CAPS: 25.0000 mg | ORAL_CAPSULE | Freq: Three times a day (TID) | ORAL | 1 refills | Status: DC
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
## 2019-11-26 ENCOUNTER — Telehealth: Payer: Self-pay | Admitting: Neurology

## 2019-11-26 NOTE — Telephone Encounter (Signed)
[employer] Auth: [id number] (exp. 11/26/19 to 12/25/19) order sent to GI. They will reach out to the patient to schedule.

## 2019-12-08 ENCOUNTER — Ambulatory Visit
Admission: RE | Admit: 2019-12-08 | Discharge: 2019-12-08 | Disposition: A | Discharge status: Home / Self Care | Payer: BC Managed Care – PPO | Source: Ambulatory Visit | Attending: Neurology | Admitting: Neurology

## 2019-12-08 DIAGNOSIS — G35 Multiple sclerosis: Secondary | ICD-10-CM

## 2019-12-08 MED ORDER — GADOBENATE DIMEGLUMINE 529 MG/ML IV SOLN
20.0000 mL | Freq: Once | INTRAVENOUS | Status: AC | PRN
  Administered 2019-12-08: 20 mL via INTRAVENOUS

## 2019-12-08 MED ORDER — GADOBENATE DIMEGLUMINE 529 MG/ML IV SOLN: 20.0000 mL | Freq: Once | INTRAVENOUS | Status: DC | PRN

## 2019-12-09 DIAGNOSIS — G4733 Obstructive sleep apnea (adult) (pediatric): Secondary | ICD-10-CM | POA: Diagnosis not present

## 2019-12-23 DIAGNOSIS — G35 Multiple sclerosis: Secondary | ICD-10-CM | POA: Diagnosis not present

## 2019-12-23 DIAGNOSIS — M450 Ankylosing spondylitis of multiple sites in spine: Secondary | ICD-10-CM | POA: Diagnosis not present

## 2019-12-23 DIAGNOSIS — H15001 Unspecified scleritis, right eye: Secondary | ICD-10-CM | POA: Diagnosis not present

## 2019-12-23 DIAGNOSIS — Z8379 Family history of other diseases of the digestive system: Secondary | ICD-10-CM | POA: Diagnosis not present

## 2019-12-23 DIAGNOSIS — M2550 Pain in unspecified joint: Secondary | ICD-10-CM | POA: Diagnosis not present

## 2020-01-02 DIAGNOSIS — M450 Ankylosing spondylitis of multiple sites in spine: Secondary | ICD-10-CM | POA: Diagnosis not present

## 2020-01-02 DIAGNOSIS — M542 Cervicalgia: Secondary | ICD-10-CM | POA: Diagnosis not present

## 2020-01-02 DIAGNOSIS — M2560 Stiffness of unspecified joint, not elsewhere classified: Secondary | ICD-10-CM | POA: Diagnosis not present

## 2020-01-02 DIAGNOSIS — M545 Low back pain: Secondary | ICD-10-CM | POA: Diagnosis not present

## 2020-01-09 DIAGNOSIS — G4733 Obstructive sleep apnea (adult) (pediatric): Secondary | ICD-10-CM | POA: Diagnosis not present

## 2020-01-10 DIAGNOSIS — M2560 Stiffness of unspecified joint, not elsewhere classified: Secondary | ICD-10-CM | POA: Diagnosis not present

## 2020-01-10 DIAGNOSIS — M5459 Other low back pain: Secondary | ICD-10-CM | POA: Diagnosis not present

## 2020-01-10 DIAGNOSIS — M542 Cervicalgia: Secondary | ICD-10-CM | POA: Diagnosis not present

## 2020-01-10 DIAGNOSIS — M450 Ankylosing spondylitis of multiple sites in spine: Secondary | ICD-10-CM | POA: Diagnosis not present

## 2020-01-12 DIAGNOSIS — F4312 Post-traumatic stress disorder, chronic: Secondary | ICD-10-CM | POA: Diagnosis not present

## 2020-01-17 DIAGNOSIS — M542 Cervicalgia: Secondary | ICD-10-CM | POA: Diagnosis not present

## 2020-01-17 DIAGNOSIS — M2560 Stiffness of unspecified joint, not elsewhere classified: Secondary | ICD-10-CM | POA: Diagnosis not present

## 2020-01-17 DIAGNOSIS — M5459 Other low back pain: Secondary | ICD-10-CM | POA: Diagnosis not present

## 2020-01-17 DIAGNOSIS — M450 Ankylosing spondylitis of multiple sites in spine: Secondary | ICD-10-CM | POA: Diagnosis not present

## 2020-02-08 DIAGNOSIS — G4733 Obstructive sleep apnea (adult) (pediatric): Secondary | ICD-10-CM | POA: Diagnosis not present

## 2020-03-10 DIAGNOSIS — G4733 Obstructive sleep apnea (adult) (pediatric): Secondary | ICD-10-CM | POA: Diagnosis not present

## 2020-03-17 DIAGNOSIS — H15001 Unspecified scleritis, right eye: Secondary | ICD-10-CM | POA: Diagnosis not present

## 2020-03-17 DIAGNOSIS — M450 Ankylosing spondylitis of multiple sites in spine: Secondary | ICD-10-CM | POA: Diagnosis not present

## 2020-03-17 DIAGNOSIS — G35 Multiple sclerosis: Secondary | ICD-10-CM | POA: Diagnosis not present

## 2020-03-17 DIAGNOSIS — M2550 Pain in unspecified joint: Secondary | ICD-10-CM | POA: Diagnosis not present

## 2020-03-24 DIAGNOSIS — H21561 Pupillary abnormality, right eye: Secondary | ICD-10-CM | POA: Diagnosis not present

## 2020-03-24 DIAGNOSIS — H15121 Nodular episcleritis, right eye: Secondary | ICD-10-CM | POA: Diagnosis not present

## 2020-03-24 DIAGNOSIS — G35 Multiple sclerosis: Secondary | ICD-10-CM | POA: Diagnosis not present

## 2020-03-24 DIAGNOSIS — H5213 Myopia, bilateral: Secondary | ICD-10-CM | POA: Diagnosis not present

## 2020-06-01 ENCOUNTER — Ambulatory Visit: Payer: BC Managed Care – PPO | Admitting: Neurology

## 2020-06-01 ENCOUNTER — Other Ambulatory Visit: Payer: Self-pay

## 2020-06-01 ENCOUNTER — Encounter: Payer: Self-pay | Admitting: Neurology

## 2020-06-01 VITALS — BP 134/84 | HR 75 | Ht 67.25 in | Wt 203.5 lb

## 2020-06-01 DIAGNOSIS — G35 Multiple sclerosis: Secondary | ICD-10-CM | POA: Diagnosis not present

## 2020-06-01 DIAGNOSIS — R509 Fever, unspecified: Secondary | ICD-10-CM

## 2020-06-01 DIAGNOSIS — M459 Ankylosing spondylitis of unspecified sites in spine: Secondary | ICD-10-CM

## 2020-06-01 DIAGNOSIS — R5383 Other fatigue: Secondary | ICD-10-CM

## 2020-06-01 DIAGNOSIS — Z79899 Other long term (current) drug therapy: Secondary | ICD-10-CM

## 2020-06-01 DIAGNOSIS — G4489 Other headache syndrome: Secondary | ICD-10-CM

## 2020-06-01 MED ORDER — AMANTADINE HCL 100 MG PO CAPS: 100.0000 mg | ORAL_CAPSULE | Freq: Two times a day (BID) | ORAL | 11 refills | Status: DC

## 2020-06-01 NOTE — Progress Notes (Signed)
[facility]  PATIENT: Brittney Harrison DOB: 09/10/1980  REFERRING DOCTOR OR PCP:  Farris Has (PCP); [facility] [phone] Neuro) SOURCE: Patient, notes from Dr. Renne Crigler, imaging and lab results, MRI images on PACS CD  _________________________________   HISTORICAL  CHIEF COMPLAINT:  Chief Complaint  Patient presents with  . Follow-up    RM 12, alone. Last seen 11/25/2019. On Cosentyx for MS. This is prescribed by Dr. Alben Deeds (Rheumatology). They are working on appealing med to get approved via insurance again. Pt feels she may have POTS, would like to get further testing to rule this out.    HISTORY OF PRESENT ILLNESS:  Brittney Harrison Is a 40 year old woman diagnosed with relapsing remitting multiple sclerosis in 2014.  She also has a rheumologic disorder (likely ankylosing spondylosis)  Update 06/01/2020: She is feeling good in general.    She is on Cosentyx (once a month self injectable) for her ankylosing spondylosis and she tolerates it well.    She switched off of Ocrevus.  She is feeling now than before June/July.   MRIs 11/2019 showed no new MS plaques.    She sees Dr. Dierdre Forth.  She has ankylosis spondylosis and hydradenitis suppurativa.   She had once been C-ANCA positive   We chose Cosentyx as it can treat both illnesses.     She has occasional fevers felt related to her rheum issues.     Gait and balance are fine.   She has mild weakness in her legs.  If she gets up rapidly she gets lightheaded and she is concerned about POTS.     She has fatigue.   She drinks "Liquid IV" which sometimes helps the spells of Lightheadedness  She is on Vyvanse.   She is switching psychiatry.  She feesl she is focusing better and fatigue is better.   Ritalin and Adderall had not helped as much as either Vyvanse or phentermine.   She still has some fatigue.  She denies any more severe headaches since her last visit with me.  She used to get migraines that started at age 72.  These  have generally done better over the last 10 years but she has had occasional very severe headaches.  MS History In May 2002, she had thoracic dysesthesias.   In 2003, while at college, she had severe fatigue and dropped out.    In 2004, she had a episode of right leg numbness x 3 weeks.   It built up over a period of one day.   She had no insurance and improved so did not seek medical opinion.   In 2005, she had more right leg issues and in 2006 had right hand numbness.    Fatigue was also worse.   She also began to note some milder cognitive issues in 2006 that worsened in 2008.  She had severe left leg cramping in 2007 associated with some weakness for a while.  She also noted right facial symptoms in 2008.     In October 2014, she had the onset of black spots in her vision.  An MRI was performed worrisome for MS and an LP showed CSF consistent with MS.   She also had more urinary incontinence around that time.    Initially, she saw Dr. Clarisse Gouge (who also saw her sister with IIH).   He referred her to Dr. Renne Crigler who started her on Copaxone.   She stopped Copaxone a year later for pregnancy and did not restart after delivery  June 2016 due to needle fatigue.    To try to help both her rheumatologic issue and MS she was started on Aubagio but had GI issues and stopped.  She then was placed on Ocrevus but had low-grade fevers that was stopped (last infusion December 2020).   She was placed on Cosentyx for ankylosing spondylosis and did well.  Secukinumab was investigated as a treatment for MS and had some efficacy though it was not a strongly efficacious medication.  It did appear to be safe.   She started Cosentyx in 2021.    DATA MRIs of the brain dated 08/15/2013 and 11/12/2014 and the MRI of the cervical spine 02/21/2013. The MRIs of the brain show a stable pattern of T2/FLAIR hyperintense foci in the periventricular, juxtacortical and deep white matter consistent with MS. There is also a focus in the right  cerebral peduncle. The brain stem appear normal. None of the foci enhanced. The MRI of the cervical spine shows a small posterior focus adjacent to C5-C6.  MRI of the brain 12/09/2016 showed no new lesions.  MRI of the brain 02/02/2019 showed no new lesions.  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  MRI 12/08/2019 brain showed T2/FLAIR hyperintense foci in the hemispheres in a pattern configuration consistent with chronic demyelinating plaque associated with multiple sclerosis.  None of the foci appear to be acute.  They did not enhance.  Compared to the MRI dated 01/20/2019, there are no new lesions.   There is abnormal signal near the junction of the right transverse sinus and sigmoid sinus.  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  It is unchanged compared to previous MRIs..   There were no acute findings and there is a normal enhancement pattern.  MRI cervical spine 12/08/2019 showed is a small T2 hyperintense focus posteriorly to the left adjacent to C6.  This was also noted on the MRI from 01/20/2019 and is consistent with a chronic demyelinating plaque associated with multiple sclerosis.       Mild multilevel degenerative changes as detailed above that do not lead to spinal stenosis or nerve root compression.  These are stable compared to the previous MRI.[name].   No acute findings.  FH:   Two distant cousins have MS.     Two family members has had ALS a swell and several have fibromyalgia.  Her 42 yo grandmoither has dementia.    REVIEW OF SYSTEMS: Constitutional: No fevers, chills, sweats, or change in appetite.  She notes fatigue. Eyes: No visual changes, double vision, eye pain Ear, nose and throat: No hearing loss, ear pain, nasal congestion, sore throat Cardiovascular: No chest pain, palpitations Respiratory: No shortness of breath at rest or with exertion.   No  wheezes GastrointestinaI: No nausea, vomiting, diarrhea, abdominal pain, fecal incontinence Genitourinary: No dysuria, urinary retention or frequency.  No nocturia. Musculoskeletal: No neck pain, back pain Integumentary: No rash, pruritus, skin lesions Neurological: as above Psychiatric: No depression at this time.  No anxiety Endocrine: No palpitations, diaphoresis, change in appetite, change in weigh or increased thirst Hematologic/Lymphatic: No anemia, purpura, petechiae. Allergic/Immunologic: No itchy/runny eyes, nasal congestion, recent allergic reactions, rashes  ALLERGIES: Allergies  Allergen Reactions  . Codeine Nausea And Vomiting  . Eye-Sed Ophthalmic [Zinc] Itching    Eye gel   . Percocet [Oxycodone-Acetaminophen] Nausea And Vomiting    HOME MEDICATIONS:  Current Outpatient Medications:  .  amantadine (SYMMETREL) 100 MG capsule, Take 1 capsule (100 mg total) by mouth 2 (two) times daily., Disp: 60 capsule, Rfl: 11 .  albuterol (PROAIR HFA) 108 (90 Base) MCG/ACT inhaler, as needed., Disp: , Rfl:  .  cetirizine (ZYRTEC) 10 MG tablet, Take 10 mg by mouth daily., Disp: , Rfl:  .  Cholecalciferol (VITAMIN D PO), Take 5,000 Units by mouth daily. , Disp: , Rfl:  .  escitalopram (LEXAPRO) 20 MG tablet, Take 1 tablet (20 mg total) by mouth daily., Disp: 90 tablet, Rfl: 0 .  hydrOXYzine (ATARAX/VISTARIL) 50 MG tablet, Take 1 tablet (50 mg total) by mouth as needed., Disp: 30 tablet, Rfl: 1 .  indomethacin (INDOCIN) 25 MG capsule, Take 1 capsule (25 mg total) by mouth 3 (three) times daily with meals., Disp: 15 capsule, Rfl: 1 .  lisdexamfetamine (VYVANSE) 30 MG capsule, Take 30 mg by mouth daily., Disp: , Rfl:  .  naproxen sodium (ALEVE) 220 MG tablet, Take 220 mg by mouth. 2-4 tablets daily, Disp: , Rfl:  .  Secukinumab (COSENTYX) 150 MG/ML SOSY, Inject 1 Dose into the skin every 30 (thirty) days., Disp: , Rfl:   PAST MEDICAL HISTORY: Past Medical History:  Diagnosis Date  .  Abnormal Pap smear 2007   CIN-1  . 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 Asthma   . 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 Clitoral irritation 07/2006  . Complication of anesthesia    severe vomiting  . Fatigue   . Fibrocystic breast 2007  . Fullness of breast 01/2007   right  . GERD (gastroesophageal reflux disease)   . H/O seasonal allergies   . H/O varicella   . Headache(784.0)    migraines   . Hx: UTI (urinary tract infection)   . Increased BMI   . Irregular bleeding 10/2005  . Multiple sclerosis (HCC)   . Thyroid disease    Fluxuate between hyper to hypo  . 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 Vision abnormalities   . Yeast vaginitis 07/2006    PAST SURGICAL HISTORY: Past Surgical History:  Procedure Laterality Date  . CHOLECYSTECTOMY  09/11/91  . DILATION AND CURETTAGE OF UTERUS  2011   endometritis  . DILATION AND CURETTAGE OF UTERUS  11/19/2011   Procedure: DILATATION AND CURETTAGE;  Surgeon: Hal Morales, MD;  Location: [facility];  Service: Gynecology;  Laterality: N/A;  dilitation and currettage with repair of intraoperative cervical laceration.  . TONSILLECTOMY  10/28/97  . WISDOM TOOTH EXTRACTION  2001    FAMILY HISTORY: Family History  Problem Relation Age of Onset  . Heart disease Father   . Alcohol abuse Father   . Melanoma Father   . Cancer Maternal Aunt        Thyroid & breast  . Cancer Paternal Uncle        Esophageal  . Diabetes Maternal Grandmother   . Cancer Maternal Grandmother        Kidney  . Cancer Paternal Grandmother   . Hypothyroidism Mother   . Goiter Mother   . Multiple sclerosis Cousin     SOCIAL HISTORY:  Social History   Socioeconomic History  . Marital status: Divorced    Spouse name: Not on file  . Number of children: Not on file  . Years of education: Not on file  . Highest education level: Not on file  Occupational History  . Not on file  Tobacco Use  .  Smoking status: Current Every Day Smoker     Packs/day: 1.50    Years: 10.00    Pack years: 15.00    Types: Cigarettes    Last attempt to quit: 02/13/2011    Years since quitting: 9.3  . Smokeless tobacco: Never Used  . Tobacco comment: Has quit 3 times this year but reports due to anxiety now  Vaping Use  . Vaping Use: Never used  Substance and Sexual Activity  . Alcohol use: Yes    Alcohol/week: 2.0 standard drinks    Types: 2 Cans of beer per week  . Drug use: No  . Sexual activity: Yes    Partners: Male    Birth control/protection: I.U.D.  Other Topics Concern  . Not on file  Social History Narrative  . Not on file   Social Determinants of Health   Financial Resource Strain: Not on file  Food Insecurity: Not on file  Transportation Needs: Not on file  Physical Activity: Not on file  Stress: Not on file  Social Connections: Not on file  Intimate Partner Violence: Not on file     PHYSICAL EXAM  Vitals:   06/01/20 1116  BP: 134/84  Pulse: 75  Weight: 203 lb 8 oz (92.3 kg)  Height: 5' 7.25" (1.708 m)    Body mass index is 31.64 kg/m.  Orthostatic Vital Signs: Supine  131/85    64 Sitting   131/90    72 Standing 30 s  121/83    78 Standing 3 minutes 129/85    81  General: The patient is well-developed and well-nourished and in no acute distress.  She has reduced range of motion in the neck.  Mild tenderness in the lower lumbar spine   Neurologic Exam  Mental status: The patient is alert and oriented x 3 at the time of the examination. The patient has apparent normal recent and remote memory, with an apparently normal attention span and concentration ability.   Speech is normal.  Cranial nerves: Extraocular movements are full.  Color vision is symmetric.  Normal facial strength and sensation.   Trapezius and sternocleidomastoid strength is normal. No dysarthria is noted.   No obvious hearing deficits are noted.  Motor:  Muscle bulk is normal.   Muscle tone is normal her strength is 5/5 in the arms and  legs now.    Sensory: She has intact touch sensation in the arms but mildly reduced sensation in left leg to touch. .    Coordination: Cerebellar testing shows good finger-nose-finger but she has reduced heel-to-shin, left worse than right.  Gait and station: Station is normal.   Her gait was normal.  Tandem gait was mildly wide.  Reflexes: Deep tendon reflexes are normal in the arms but increased at knees and ankles, left > right.    No ankle clonus.  Plantar responses were flexor.    DIAGNOSTIC DATA (LABS, IMAGING, TESTING) - I reviewed patient records, labs, notes, testing and imaging myself where available.  Lab Results  Component Value Date   WBC 11.8 (H) 08/14/2019   HGB 16.0 (H) 08/14/2019   HCT 46.5 08/14/2019   MCV 91 08/14/2019   PLT 260 08/14/2019        ASSESSMENT AND PLAN   Multiple sclerosis (HCC)  Ankylosing spondylitis, unspecified site of spine (HCC)  Other fatigue  High risk medication use  Other headache syndrome  Fever, unspecified fever cause   1.   Her MS has done well and she  has no recent exacerbation.  Continue Cosentyx (secukinumab) --- it was trialed in a phase 2 with some efficacy and apparent safety .    Dr. Dierdre Forth checks labs.     2.    Continue to be active and exercise as tolerated.    Add extra salt to diet if it helps symptoms.   3.    Continue Vyvanse for ADD,  fatigue and weight gain.  I will have her add amantadine 100 mg p.o. twice daily for the fatigue. 4.   She will return to see me in 6 months or sooner if there are new or worsening neurologic symptoms.   Upton Russey A. Epimenio Foot, MD, [facility] 06/01/2020, 3:08 PM Certified in Neurology, Clinical Neurophysiology, Sleep Medicine, Pain Medicine and Neuroimaging  [facility] [address] [place], [place] [address] [phone]

## 2020-06-15 DIAGNOSIS — F4312 Post-traumatic stress disorder, chronic: Secondary | ICD-10-CM | POA: Diagnosis not present

## 2020-06-15 DIAGNOSIS — F902 Attention-deficit hyperactivity disorder, combined type: Secondary | ICD-10-CM | POA: Diagnosis not present

## 2020-06-15 DIAGNOSIS — F411 Generalized anxiety disorder: Secondary | ICD-10-CM | POA: Diagnosis not present

## 2020-06-16 DIAGNOSIS — H15001 Unspecified scleritis, right eye: Secondary | ICD-10-CM | POA: Diagnosis not present

## 2020-06-16 DIAGNOSIS — M450 Ankylosing spondylitis of multiple sites in spine: Secondary | ICD-10-CM | POA: Diagnosis not present

## 2020-06-16 DIAGNOSIS — M2550 Pain in unspecified joint: Secondary | ICD-10-CM | POA: Diagnosis not present

## 2020-06-16 DIAGNOSIS — G35 Multiple sclerosis: Secondary | ICD-10-CM | POA: Diagnosis not present

## 2020-07-16 DIAGNOSIS — F4312 Post-traumatic stress disorder, chronic: Secondary | ICD-10-CM | POA: Diagnosis not present

## 2020-07-16 DIAGNOSIS — F411 Generalized anxiety disorder: Secondary | ICD-10-CM | POA: Diagnosis not present

## 2020-07-16 DIAGNOSIS — F902 Attention-deficit hyperactivity disorder, combined type: Secondary | ICD-10-CM | POA: Diagnosis not present

## 2020-08-14 DIAGNOSIS — F411 Generalized anxiety disorder: Secondary | ICD-10-CM | POA: Diagnosis not present

## 2020-08-14 DIAGNOSIS — F4312 Post-traumatic stress disorder, chronic: Secondary | ICD-10-CM | POA: Diagnosis not present

## 2020-08-14 DIAGNOSIS — F902 Attention-deficit hyperactivity disorder, combined type: Secondary | ICD-10-CM | POA: Diagnosis not present

## 2020-09-25 DIAGNOSIS — F411 Generalized anxiety disorder: Secondary | ICD-10-CM | POA: Diagnosis not present

## 2020-09-25 DIAGNOSIS — F902 Attention-deficit hyperactivity disorder, combined type: Secondary | ICD-10-CM | POA: Diagnosis not present

## 2020-09-25 DIAGNOSIS — F4312 Post-traumatic stress disorder, chronic: Secondary | ICD-10-CM | POA: Diagnosis not present

## 2020-10-22 DIAGNOSIS — F4312 Post-traumatic stress disorder, chronic: Secondary | ICD-10-CM | POA: Diagnosis not present

## 2020-10-22 DIAGNOSIS — F902 Attention-deficit hyperactivity disorder, combined type: Secondary | ICD-10-CM | POA: Diagnosis not present

## 2020-10-22 DIAGNOSIS — F411 Generalized anxiety disorder: Secondary | ICD-10-CM | POA: Diagnosis not present

## 2020-11-24 DIAGNOSIS — R0981 Nasal congestion: Secondary | ICD-10-CM | POA: Diagnosis not present

## 2020-11-24 DIAGNOSIS — Z20822 Contact with and (suspected) exposure to covid-19: Secondary | ICD-10-CM | POA: Diagnosis not present

## 2020-11-24 DIAGNOSIS — R509 Fever, unspecified: Secondary | ICD-10-CM | POA: Diagnosis not present

## 2020-11-24 DIAGNOSIS — R5383 Other fatigue: Secondary | ICD-10-CM | POA: Diagnosis not present

## 2020-11-24 DIAGNOSIS — R059 Cough, unspecified: Secondary | ICD-10-CM | POA: Diagnosis not present

## 2020-11-26 DIAGNOSIS — F4312 Post-traumatic stress disorder, chronic: Secondary | ICD-10-CM | POA: Diagnosis not present

## 2020-11-26 DIAGNOSIS — F411 Generalized anxiety disorder: Secondary | ICD-10-CM | POA: Diagnosis not present

## 2020-11-26 DIAGNOSIS — F902 Attention-deficit hyperactivity disorder, combined type: Secondary | ICD-10-CM | POA: Diagnosis not present

## 2020-11-30 ENCOUNTER — Encounter: Payer: Self-pay | Admitting: Neurology

## 2020-11-30 ENCOUNTER — Other Ambulatory Visit: Payer: Self-pay

## 2020-11-30 ENCOUNTER — Ambulatory Visit: Payer: BC Managed Care – PPO | Admitting: Neurology

## 2020-11-30 VITALS — BP 126/86 | HR 86 | Ht 68.0 in | Wt 198.0 lb

## 2020-11-30 DIAGNOSIS — R5383 Other fatigue: Secondary | ICD-10-CM | POA: Diagnosis not present

## 2020-11-30 DIAGNOSIS — M459 Ankylosing spondylitis of unspecified sites in spine: Secondary | ICD-10-CM

## 2020-11-30 DIAGNOSIS — G35 Multiple sclerosis: Secondary | ICD-10-CM

## 2020-11-30 DIAGNOSIS — H469 Unspecified optic neuritis: Secondary | ICD-10-CM | POA: Diagnosis not present

## 2020-11-30 DIAGNOSIS — G90A Postural orthostatic tachycardia syndrome (POTS): Secondary | ICD-10-CM

## 2020-11-30 DIAGNOSIS — I498 Other specified cardiac arrhythmias: Secondary | ICD-10-CM

## 2020-11-30 DIAGNOSIS — G4733 Obstructive sleep apnea (adult) (pediatric): Secondary | ICD-10-CM

## 2020-11-30 MED ORDER — INDOMETHACIN 25 MG PO CAPS
25.0000 mg | ORAL_CAPSULE | Freq: Three times a day (TID) | ORAL | 1 refills | Status: DC
Start: 2020-11-30 — End: 2022-06-06

## 2020-11-30 MED ORDER — FLUDROCORTISONE ACETATE 0.1 MG PO TABS: 0.1000 mg | ORAL_TABLET | Freq: Every day | ORAL | 11 refills | Status: DC

## 2020-11-30 NOTE — Progress Notes (Signed)
[facility]  PATIENT: Brittney Harrison DOB: [birth year]  REFERRING DOCTOR OR PCP:  Farris Has (PCP); [facility] [phone] Neuro) SOURCE: Patient, notes from Dr. Renne Crigler, imaging and lab results, MRI images on PACS CD  _________________________________   HISTORICAL  CHIEF COMPLAINT:  Chief Complaint  Patient presents with   Follow-up    Pt alone, rm 2. Last ov 06/01/2020. MS pt on Cosentyx. After her rheumatology visit she has developed some MS symptoms. Some were new but they didn't last long. She has had a lot near falls. She fainted the other day which was first time in 5 yrs.     HISTORY OF PRESENT ILLNESS:  Brittney Harrison Is a 40 y.o. woman diagnosed with relapsing remitting multiple sclerosis in 2014.  She also has a rheumologic disorder (likely ankylosing spondylosis)  Update 11/30/2020: She is feeling good in general.    She is on Cosentyx (once a month self injectable) for her ankylosing spondylosis and she tolerates it well.   She believes it is helping her AS.    We have discussed that in a phase 2 study it seemed to have some efficacy in MS as well..   She switched off of Ocrevus which was porrly tolerated and less effective for her ankylosing spondylosis.   MRIs 11/2019 showed no new MS plaques.    She sees Dr. Dierdre Forth for her ankylosing spondylosis. and hydradenitis suppurativa.   She had once been C-ANCA positive   We chose Cosentyx as it can treat both illnesses.     She has occasional fevers felt related to her rheum issues.     Gait and balance are mildly off and she has frequent stumbles though no recet falls.   She has mild weakness in her legs.  She has had some episodes of foot dysesthesias but they can last < 1 day.    If she gets up rapidly she gets lightheaded .  She fainted 2 days ago and has done so a few other times since the last visit.   HR sometimes elevated upon standing.  .   She is lightheaded upon standing 80% of the time.    She is on Adderall  for ADD with benefit.     She still has some fatigue.    She has OSA but CPAP was poorly tolerated.     She get occasional migraines, best helped by prn indomethacin.    MS History In May 2002, she had thoracic dysesthesias.   In 2003, while at college, she had severe fatigue and dropped out.    In 2004, she had a episode of right leg numbness x 3 weeks.   It built up over a period of one day.   She had no insurance and improved so did not seek medical opinion.   In 2005, she had more right leg issues and in 2006 had right hand numbness.    Fatigue was also worse.   She also began to note some milder cognitive issues in 2006 that worsened in 2008.  She had severe left leg cramping in 2007 associated with some weakness for a while.  She also noted right facial symptoms in 2008.     In October 2014, she had the onset of black spots in her vision.  An MRI was performed worrisome for MS and an LP showed CSF consistent with MS.   She also had more urinary incontinence around that time.    Initially, she saw Dr. Clarisse Gouge (  who also saw her sister with IIH).   He referred her to Dr. Renne Crigler who started her on Copaxone.   She stopped Copaxone a year later for pregnancy and did not restart after delivery June 2016 due to needle fatigue.    To try to help both her rheumatologic issue and MS she was started on Aubagio but had GI issues and stopped.  She then was placed on Ocrevus but had low-grade fevers that was stopped (last infusion December 2020).   She was placed on Cosentyx for ankylosing spondylosis and did well.  Secukinumab was investigated as a treatment for MS and had some efficacy though it was not a strongly efficacious medication.  It did appear to be safe.   She started Cosentyx in 2021.    DATA MRIs of the brain dated 08/15/2013 and 11/12/2014 and the MRI of the cervical spine 02/21/2013. The MRIs of the brain show a stable pattern of T2/FLAIR hyperintense foci in the periventricular, juxtacortical and deep  white matter consistent with MS. There is also a focus in the right cerebral peduncle. The brain stem appear normal. None of the foci enhanced. The MRI of the cervical spine shows a small posterior focus adjacent to C5-C6.  MRI of the brain 12/09/2016 showed no new lesions.  MRI of the brain 02/02/2019 showed no new lesions.  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  MRI 12/08/2019 brain showed T2/FLAIR hyperintense foci in the hemispheres in a pattern configuration consistent with chronic demyelinating plaque associated with multiple sclerosis.  None of the foci appear to be acute.  They did not enhance.  Compared to the MRI dated 01/20/2019, there are no new lesions.   There is abnormal signal near the junction of the right transverse sinus and sigmoid sinus.  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  It is unchanged compared to previous MRIs..   There were no acute findings and there is a normal enhancement pattern.  MRI cervical spine 12/08/2019 showed is a small T2 hyperintense focus posteriorly to the left adjacent to C6.  This was also noted on the MRI from 01/20/2019 and is consistent with a chronic demyelinating plaque associated with multiple sclerosis.       Mild multilevel degenerative changes as detailed above that do not lead to spinal stenosis or nerve root compression.  These are stable compared to the previous MRI.[name]   No acute findings.  FH:   Two distant cousins have MS.     Two family members has had ALS a swell and several have fibromyalgia.  Her 64 yo grandmoither has dementia.    REVIEW OF SYSTEMS: Constitutional: No fevers, chills, sweats, or change in appetite.  She notes fatigue. Eyes: No visual changes, double vision, eye pain Ear, nose and throat: No hearing loss, ear pain, nasal congestion, sore throat Cardiovascular: No chest pain,  palpitations Respiratory:  No shortness of breath at rest or with exertion.   No wheezes GastrointestinaI: No nausea, vomiting, diarrhea, abdominal pain, fecal incontinence Genitourinary:  No dysuria, urinary retention or frequency.  No nocturia. Musculoskeletal:  No neck pain, back pain Integumentary: No rash, pruritus, skin lesions Neurological: as above Psychiatric: No depression at this time.  No anxiety Endocrine: No palpitations, diaphoresis, change in appetite, change in weigh or increased thirst Hematologic/Lymphatic:  No anemia, purpura, petechiae. Allergic/Immunologic: No itchy/runny eyes, nasal congestion,  recent allergic reactions, rashes  ALLERGIES: Allergies  Allergen Reactions   Codeine Nausea And Vomiting   Eye-Sed Ophthalmic [Zinc] Itching    Eye gel    Percocet [Oxycodone-Acetaminophen] Nausea And Vomiting    HOME MEDICATIONS:  Current Outpatient Medications:    ADDERALL XR 20 MG 24 hr capsule, Take 20 mg by mouth every morning., Disp: , Rfl:    albuterol (VENTOLIN HFA) 108 (90 Base) MCG/ACT inhaler, as needed., Disp: , Rfl:    amantadine (SYMMETREL) 100 MG capsule, Take 1 capsule (100 mg total) by mouth 2 (two) times daily., Disp: 60 capsule, Rfl: 11   cetirizine (ZYRTEC) 10 MG tablet, Take 10 mg by mouth daily., Disp: , Rfl:    Cholecalciferol (VITAMIN D PO), Take 5,000 Units by mouth daily. , Disp: , Rfl:    escitalopram (LEXAPRO) 20 MG tablet, Take 1 tablet (20 mg total) by mouth daily., Disp: 90 tablet, Rfl: 0   fludrocortisone (FLORINEF) 0.1 MG tablet, Take 1 tablet (0.1 mg total) by mouth daily., Disp: 30 tablet, Rfl: 11   hydrOXYzine (ATARAX/VISTARIL) 50 MG tablet, Take 1 tablet (50 mg total) by mouth as needed., Disp: 30 tablet, Rfl: 1   naproxen sodium (ALEVE) 220 MG tablet, Take 220 mg by mouth. 2-4 tablets daily, Disp: , Rfl:    Secukinumab (COSENTYX) 150 MG/ML SOSY, Inject 1 Dose into the skin every 30 (thirty) days., Disp: , Rfl:    indomethacin  (INDOCIN) 25 MG capsule, Take 1 capsule (25 mg total) by mouth 3 (three) times daily with meals., Disp: 15 capsule, Rfl: 1  PAST MEDICAL HISTORY: Past Medical History:  Diagnosis Date   Abnormal Pap smear 2007   CIN-1   ASCUS with positive high risk HPV 05/2005   Asthma    CIN I (cervical intraepithelial neoplasia I) 06/2005   Clitoral irritation 07/2006   Complication of anesthesia    severe vomiting   Fatigue    Fibrocystic breast 2007   Fullness of breast 01/2007   right   GERD (gastroesophageal reflux disease)    H/O seasonal allergies    H/O varicella    Headache(784.0)    migraines    Hx: UTI (urinary tract infection)    Increased BMI    Irregular bleeding 10/2005   Multiple sclerosis (HCC)    Thyroid disease    Fluxuate between hyper to hypo   Thyromegaly 11/2005   Vision abnormalities    Yeast vaginitis 07/2006    PAST SURGICAL HISTORY: Past Surgical History:  Procedure Laterality Date   CHOLECYSTECTOMY  09/11/91   DILATION AND CURETTAGE OF UTERUS  2011   endometritis   DILATION AND CURETTAGE OF UTERUS  11/19/2011   Procedure: DILATATION AND CURETTAGE;  Surgeon: Hal Morales, MD;  Location: [facility];  Service: Gynecology;  Laterality: N/A;  dilitation and currettage with repair of intraoperative cervical laceration.   TONSILLECTOMY  10/28/97   WISDOM TOOTH EXTRACTION  2001    FAMILY HISTORY: Family History  Problem Relation Age of Onset   Heart disease Father    Alcohol abuse Father    Melanoma Father    Cancer Maternal Aunt        Thyroid & breast   Cancer Paternal Uncle        Esophageal   Diabetes Maternal Grandmother    Cancer Maternal Grandmother        Kidney   Cancer Paternal Grandmother    Hypothyroidism Mother    Goiter Mother    Multiple  sclerosis Cousin     SOCIAL HISTORY:  Social History   Socioeconomic History   Marital status: Divorced    Spouse name: Not on file   Number of children: Not on file   Years of education: Not on file    Highest education level: Not on file  Occupational History   Not on file  Tobacco Use   Smoking status: Every Day    Packs/day: 1.50    Years: 10.00    Pack years: 15.00    Types: Cigarettes    Last attempt to quit: 02/13/2011    Years since quitting: 9.8   Smokeless tobacco: Never   Tobacco comments:    Has quit 3 times this year but reports due to anxiety now  Vaping Use   Vaping Use: Never used  Substance and Sexual Activity   Alcohol use: Yes    Alcohol/week: 2.0 standard drinks    Types: 2 Cans of beer per week   Drug use: No   Sexual activity: Yes    Partners: Male    Birth control/protection: I.U.D.  Other Topics Concern   Not on file  Social History Narrative   Not on file   Social Determinants of Health   Financial Resource Strain: Not on file  Food Insecurity: Not on file  Transportation Needs: Not on file  Physical Activity: Not on file  Stress: Not on file  Social Connections: Not on file  Intimate Partner Violence: Not on file     PHYSICAL EXAM  Vitals:   11/30/20 1121  BP: 126/86  Pulse: 86  Weight: 198 lb (89.8 kg)  Height: 5\' 8"  (1.727 m)    Body mass index is 30.11 kg/m.   General: The patient is well-developed and well-nourished and in no acute distress.  She has reduced range of motion in the neck.  Mild tenderness in the lower lumbar spine   Neurologic Exam  Mental status: The patient is alert and oriented x 3 at the time of the examination. The patient has apparent normal recent and remote memory, with an apparently normal attention span and concentration ability.   Speech is normal.  Cranial nerves: Extraocular movements are full.  Color vision is symmetric.  Normal facial strength and sensation.   Trapezius and sternocleidomastoid strength is normal. No dysarthria is noted.   No obvious hearing deficits are noted.  Motor:  Muscle bulk is normal.   Muscle tone is normal her strength is 5/5 in the arms and legs now.    Sensory:  She has intact touch sensation in the arms but mildly reduced sensation in left leg to touch. .    Coordination: Cerebellar testing shows good finger-nose-finger but she has reduced heel-to-shin, left worse than right.  Gait and station: Station is normal.   Her gait was normal.  Tandem gait was mildly wide.  Reflexes: Deep tendon reflexes are normal in the arms but increased at knees and ankles, left > right.    No ankle clonus.  Plantar responses were flexor.    DIAGNOSTIC DATA (LABS, IMAGING, TESTING) - I reviewed patient records, labs, notes, testing and imaging myself where available.  Lab Results  Component Value Date   WBC 11.8 (H) 08/14/2019   HGB 16.0 (H) 08/14/2019   HCT 46.5 08/14/2019   MCV 91 08/14/2019   PLT 260 08/14/2019        ASSESSMENT AND PLAN   Multiple sclerosis (HCC) - Plan: MR BRAIN W WO CONTRAST  Left  optic neuritis  Ankylosing spondylitis, unspecified site of spine (HCC)  Other fatigue  OSA (obstructive sleep apnea)  POTS (postural orthostatic tachycardia syndrome)   1.   Her MS seems to be stable with no recent exacerbation.  Continue Cosentyx (secukinumab) --- it was trialed in a phase 2 with some efficacy and apparent safety .    Dr. Dierdre ForthBeekman checks labs.     2.    Fludrocortisone for possible POTS.   3.    Continue Adderall for ADD,  fatigue and amantadine 100 mg p.o. twice daily for the fatigue.   COnsider an oral appliance for OSA 4.    Combination of ankylosing spondylosis, MS related impairments and fatigue causes disability. 5.   She will return to see me in 6 months or sooner if there are new or worsening neurologic symptoms.   Shamell Hittle A. Epimenio FootSater, MD, [facility],FAAN 11/30/2020, 3:45 PM Certified in Neurology, Clinical Neurophysiology, Sleep Medicine, Pain Medicine and Neuroimaging  [facility] [address], [address] [place], [place] [address] [phone]

## 2020-12-02 ENCOUNTER — Telehealth: Payer: Self-pay | Admitting: Neurology

## 2020-12-02 NOTE — Telephone Encounter (Signed)
MR Brain w/wo contrast Dr. Gershon Mussel Berkley Harvey: [id number] (exp. 12/02/20 to 12/31/20) patient is scheduled at [facility] for 12/15/20.

## 2020-12-15 ENCOUNTER — Ambulatory Visit: Payer: BC Managed Care – PPO

## 2020-12-15 DIAGNOSIS — G35 Multiple sclerosis: Secondary | ICD-10-CM

## 2020-12-15 MED ORDER — GADOBENATE DIMEGLUMINE 529 MG/ML IV SOLN
20.0000 mL | Freq: Once | INTRAVENOUS | Status: AC | PRN
  Administered 2020-12-15: 20 mL via INTRAVENOUS

## 2020-12-16 DIAGNOSIS — J4541 Moderate persistent asthma with (acute) exacerbation: Secondary | ICD-10-CM | POA: Diagnosis not present

## 2020-12-16 DIAGNOSIS — R062 Wheezing: Secondary | ICD-10-CM | POA: Diagnosis not present

## 2020-12-16 DIAGNOSIS — Z20822 Contact with and (suspected) exposure to covid-19: Secondary | ICD-10-CM | POA: Diagnosis not present

## 2020-12-16 DIAGNOSIS — R051 Acute cough: Secondary | ICD-10-CM | POA: Diagnosis not present

## 2020-12-24 DIAGNOSIS — F902 Attention-deficit hyperactivity disorder, combined type: Secondary | ICD-10-CM | POA: Diagnosis not present

## 2020-12-24 DIAGNOSIS — F4312 Post-traumatic stress disorder, chronic: Secondary | ICD-10-CM | POA: Diagnosis not present

## 2020-12-24 DIAGNOSIS — F411 Generalized anxiety disorder: Secondary | ICD-10-CM | POA: Diagnosis not present

## 2020-12-31 DIAGNOSIS — M2550 Pain in unspecified joint: Secondary | ICD-10-CM | POA: Diagnosis not present

## 2020-12-31 DIAGNOSIS — H15001 Unspecified scleritis, right eye: Secondary | ICD-10-CM | POA: Diagnosis not present

## 2020-12-31 DIAGNOSIS — G35 Multiple sclerosis: Secondary | ICD-10-CM | POA: Diagnosis not present

## 2020-12-31 DIAGNOSIS — M450 Ankylosing spondylitis of multiple sites in spine: Secondary | ICD-10-CM | POA: Diagnosis not present

## 2020-12-31 DIAGNOSIS — Z111 Encounter for screening for respiratory tuberculosis: Secondary | ICD-10-CM | POA: Diagnosis not present

## 2021-01-29 DIAGNOSIS — F902 Attention-deficit hyperactivity disorder, combined type: Secondary | ICD-10-CM | POA: Diagnosis not present

## 2021-01-29 DIAGNOSIS — F411 Generalized anxiety disorder: Secondary | ICD-10-CM | POA: Diagnosis not present

## 2021-01-29 DIAGNOSIS — F4312 Post-traumatic stress disorder, chronic: Secondary | ICD-10-CM | POA: Diagnosis not present

## 2021-02-09 DIAGNOSIS — F909 Attention-deficit hyperactivity disorder, unspecified type: Secondary | ICD-10-CM | POA: Diagnosis not present

## 2021-02-09 DIAGNOSIS — F418 Other specified anxiety disorders: Secondary | ICD-10-CM | POA: Diagnosis not present

## 2021-02-09 DIAGNOSIS — G35 Multiple sclerosis: Secondary | ICD-10-CM | POA: Diagnosis not present

## 2021-02-09 DIAGNOSIS — M459 Ankylosing spondylitis of unspecified sites in spine: Secondary | ICD-10-CM | POA: Diagnosis not present

## 2021-02-09 DIAGNOSIS — Z23 Encounter for immunization: Secondary | ICD-10-CM | POA: Diagnosis not present

## 2021-03-25 DIAGNOSIS — H15121 Nodular episcleritis, right eye: Secondary | ICD-10-CM | POA: Diagnosis not present

## 2021-03-25 DIAGNOSIS — H5213 Myopia, bilateral: Secondary | ICD-10-CM | POA: Diagnosis not present

## 2021-03-25 DIAGNOSIS — H21561 Pupillary abnormality, right eye: Secondary | ICD-10-CM | POA: Diagnosis not present

## 2021-03-25 DIAGNOSIS — G35 Multiple sclerosis: Secondary | ICD-10-CM | POA: Diagnosis not present

## 2021-04-22 DIAGNOSIS — G35 Multiple sclerosis: Secondary | ICD-10-CM | POA: Diagnosis not present

## 2021-04-22 DIAGNOSIS — H15001 Unspecified scleritis, right eye: Secondary | ICD-10-CM | POA: Diagnosis not present

## 2021-04-22 DIAGNOSIS — M2550 Pain in unspecified joint: Secondary | ICD-10-CM | POA: Diagnosis not present

## 2021-04-22 DIAGNOSIS — M450 Ankylosing spondylitis of multiple sites in spine: Secondary | ICD-10-CM | POA: Diagnosis not present

## 2021-05-06 DIAGNOSIS — R509 Fever, unspecified: Secondary | ICD-10-CM | POA: Diagnosis not present

## 2021-05-06 DIAGNOSIS — U071 COVID-19: Secondary | ICD-10-CM | POA: Diagnosis not present

## 2021-05-31 ENCOUNTER — Encounter: Payer: Self-pay | Admitting: Neurology

## 2021-05-31 ENCOUNTER — Other Ambulatory Visit: Payer: Self-pay

## 2021-05-31 ENCOUNTER — Ambulatory Visit: Payer: BC Managed Care – PPO | Admitting: Neurology

## 2021-05-31 VITALS — BP 144/93 | HR 85 | Wt 189.0 lb

## 2021-05-31 DIAGNOSIS — G4489 Other headache syndrome: Secondary | ICD-10-CM

## 2021-05-31 DIAGNOSIS — H469 Unspecified optic neuritis: Secondary | ICD-10-CM

## 2021-05-31 DIAGNOSIS — G4733 Obstructive sleep apnea (adult) (pediatric): Secondary | ICD-10-CM

## 2021-05-31 DIAGNOSIS — G35 Multiple sclerosis: Secondary | ICD-10-CM | POA: Diagnosis not present

## 2021-05-31 DIAGNOSIS — M459 Ankylosing spondylitis of unspecified sites in spine: Secondary | ICD-10-CM

## 2021-05-31 DIAGNOSIS — R5383 Other fatigue: Secondary | ICD-10-CM | POA: Diagnosis not present

## 2021-05-31 DIAGNOSIS — G90A Postural orthostatic tachycardia syndrome (POTS): Secondary | ICD-10-CM

## 2021-05-31 MED ORDER — AMANTADINE HCL 100 MG PO CAPS: 100.0000 mg | ORAL_CAPSULE | Freq: Two times a day (BID) | ORAL | 11 refills | Status: DC

## 2021-05-31 MED ORDER — FLUDROCORTISONE ACETATE 0.1 MG PO TABS: 0.1000 mg | ORAL_TABLET | Freq: Every day | ORAL | 11 refills | Status: DC

## 2021-05-31 NOTE — Progress Notes (Signed)
[facility]  PATIENT: Brittney Harrison DOB: 06-11-1980  REFERRING DOCTOR OR PCP:  Farris Has (PCP); [facility] [phone] Neuro) SOURCE: Patient, notes from Dr. Renne Crigler, imaging and lab results, MRI images on PACS CD  _________________________________   HISTORICAL  CHIEF COMPLAINT:  Chief Complaint  Patient presents with   Follow-up    Pt alone, rm 2. Overall feels that MS has been overall stable. The medication that was prescribed to help her with dizziness (? POTS) has helped and works well. On Cosentyx    HISTORY OF PRESENT ILLNESS:  Brittney Harrison Is a 41 y.o. woman diagnosed with relapsing remitting multiple sclerosis in 2014.  She also has a rheumologic disorder (likely ankylosing spondylosis)  Update 05/31/2020: She is feeling good in general.      She is on Cosentyx (once a month self injectable) for her ankylosing spondylosis and she tolerates it well.   She believes it is helping her AS.    Methotrexate was added by Dr. Dierdre Forth after a week of prednisone made her feel better.  We have discussed that in a phase 2 study it seemed to have some efficacy in MS as well..   She switched off of Ocrevus which was porrly tolerated and less effective for her ankylosing spondylosis.   MRIs 11/2019 showed no new MS plaques.    She sees Dr. Dierdre Forth for her ankylosing spondylosis. and hydradenitis suppurativa.   She had once been C-ANCA positive   We chose Cosentyx as it can treat both illnesses.     She has occasional fevers felt related to her rheum issues.   She has fever unknown origin at times.     Gait and balance are mildly off and she has frequent stumbles and a couple falls.   She uses the bannister on stairs.  She has mild weakness in her legs.  She has had some episodes of foot dysesthesias but they can last < 1 day.    If she stands up up rapidly she gets lightheaded .  She fainted a few times.   She was getting lightheaded upon standing 80% of the time.  Fludrocortisone  has helped a lot.   She is on Adderall for ADD with benefit (PCP is writing).   She still has some fatigue.  Amantadine has helped.     She has OSA but CPAP was poorly tolerated.   Mild OSA with an AHI = 9.9/hr. OSA was severe during REM sleep with a REM-AHI = 34.6  We discussed an oral appliance.    She get occasional migraines, best helped by prn indomethacin.   It helped a recent occipital headache as well.     MS History In May 2002, she had thoracic dysesthesias.   In 2003, while at college, she had severe fatigue and dropped out.    In 2004, she had a episode of right leg numbness x 3 weeks.   It built up over a period of one day.   She had no insurance and improved so did not seek medical opinion.   In 2005, she had more right leg issues and in 2006 had right hand numbness.    Fatigue was also worse.   She also began to note some milder cognitive issues in 2006 that worsened in 2008.  She had severe left leg cramping in 2007 associated with some weakness for a while.  She also noted right facial symptoms in 2008.     In October 2014, she  had the onset of black spots in her vision.  An MRI was performed worrisome for MS and an LP showed CSF consistent with MS.   She also had more urinary incontinence around that time.    Initially, she saw Dr. Clarisse Gouge (who also saw her sister with IIH).   He referred her to Dr. Renne Crigler who started her on Copaxone.   She stopped Copaxone a year later for pregnancy and did not restart after delivery June 2016 due to needle fatigue.    To try to help both her rheumatologic issue and MS she was started on Aubagio but had GI issues and stopped.  She then was placed on Ocrevus but had low-grade fevers that was stopped (last infusion December 2020).   She was placed on Cosentyx for ankylosing spondylosis and did well.  Secukinumab/Cosentyx was investigated as a treatment for MS and had some efficacy though it was not a strongly efficacious medication.  It did appear to be safe.    She started Cosentyx in 2021.    DATA MRIs of the brain dated 08/15/2013 and 11/12/2014 and the MRI of the cervical spine 02/21/2013. The MRIs of the brain show a stable pattern of T2/FLAIR hyperintense foci in the periventricular, juxtacortical and deep white matter consistent with MS. There is also a focus in the right cerebral peduncle. The brain stem appear normal. None of the foci enhanced. The MRI of the cervical spine shows a small posterior focus adjacent to C5-C6.  MRI of the brain 12/09/2016 showed no new lesions.  MRI of the brain 02/02/2019 showed no new lesions.  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  MRI 12/08/2019 brain showed T2/FLAIR hyperintense foci in the hemispheres in a pattern configuration consistent with chronic demyelinating plaque associated with multiple sclerosis.  None of the foci appear to be acute.  They did not enhance.  Compared to the MRI dated 01/20/2019, there are no new lesions.   There is abnormal signal near the junction of the right transverse sinus and sigmoid sinus.  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  It is unchanged compared to previous MRIs..   There were no acute findings and there is a normal enhancement pattern.  MRI cervical spine 12/08/2019 showed is a small T2 hyperintense focus posteriorly to the left adjacent to C6.  This was also noted on the MRI from 01/20/2019 and is consistent with a chronic demyelinating plaque associated with multiple sclerosis.       Mild multilevel degenerative changes as detailed above that do not lead to spinal stenosis or nerve root compression.  These are stable compared to the previous MRI.[name]   No acute findings.  MRI brain 12/15/2020 showed multiple T2/FLAIR hyperintense foci in the periventricular, juxtacortical and deep white matter of the hemispheres consistent with chronic  demyelinating plaque associated with multiple sclerosis.  The foci do not enhance nor appear to be acute.  Compared to the MRI from 12/08/2019, there are no new lesions.      Abnormal signal at the junction of the right transverse sinus and sigmoid sinus.  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   FH:   Two distant cousins have MS.     Two family members has had ALS a swell and several have fibromyalgia.  Her 66 yo grandmoither has  dementia.    REVIEW OF SYSTEMS: Constitutional: No fevers, chills, sweats, or change in appetite.  She notes fatigue. Eyes: No visual changes, double vision, eye pain Ear, nose and throat: No hearing loss, ear pain, nasal congestion, sore throat Cardiovascular: No chest pain, palpitations Respiratory:  No shortness of breath at rest or with exertion.   No wheezes GastrointestinaI: No nausea, vomiting, diarrhea, abdominal pain, fecal incontinence Genitourinary:  No dysuria, urinary retention or frequency.  No nocturia. Musculoskeletal:  No neck pain, back pain Integumentary: No rash, pruritus, skin lesions Neurological: as above Psychiatric: No depression at this time.  No anxiety Endocrine: No palpitations, diaphoresis, change in appetite, change in weigh or increased thirst Hematologic/Lymphatic:  No anemia, purpura, petechiae. Allergic/Immunologic: No itchy/runny eyes, nasal congestion, recent allergic reactions, rashes  ALLERGIES: Allergies  Allergen Reactions   Codeine Nausea And Vomiting   Eye-Sed Ophthalmic [Zinc] Itching    Eye gel    Percocet [Oxycodone-Acetaminophen] Nausea And Vomiting    HOME MEDICATIONS:  Current Outpatient Medications:    albuterol (VENTOLIN HFA) 108 (90 Base) MCG/ACT inhaler, as needed., Disp: , Rfl:    amphetamine-dextroamphetamine (ADDERALL XR) 25 MG 24 hr capsule, Take 25 mg by mouth every morning., Disp: , Rfl:    cetirizine (ZYRTEC) 10 MG tablet, Take 10 mg by mouth  daily., Disp: , Rfl:    Cholecalciferol (VITAMIN D PO), Take 5,000 Units by mouth daily. , Disp: , Rfl:    escitalopram (LEXAPRO) 20 MG tablet, Take 1 tablet (20 mg total) by mouth daily., Disp: 90 tablet, Rfl: 0   Folic Acid 20 MG CAPS, See admin instructions., Disp: , Rfl:    hydrOXYzine (ATARAX/VISTARIL) 50 MG tablet, Take 1 tablet (50 mg total) by mouth as needed., Disp: 30 tablet, Rfl: 1   indomethacin (INDOCIN) 25 MG capsule, Take 1 capsule (25 mg total) by mouth 3 (three) times daily with meals., Disp: 15 capsule, Rfl: 1   methotrexate (RHEUMATREX) 2.5 MG tablet, Take 15 mg by mouth once a week., Disp: , Rfl:    naproxen sodium (ALEVE) 220 MG tablet, Take 220 mg by mouth. 2-4 tablets daily, Disp: , Rfl:    Secukinumab (COSENTYX) 150 MG/ML SOSY, Inject 1 Dose into the skin every 30 (thirty) days., Disp: , Rfl:    amantadine (SYMMETREL) 100 MG capsule, Take 1 capsule (100 mg total) by mouth 2 (two) times daily., Disp: 60 capsule, Rfl: 11   fludrocortisone (FLORINEF) 0.1 MG tablet, Take 1 tablet (0.1 mg total) by mouth daily., Disp: 30 tablet, Rfl: 11  PAST MEDICAL HISTORY: Past Medical History:  Diagnosis Date   Abnormal Pap smear 2007   CIN-1   ASCUS with positive high risk HPV 05/2005   Asthma    CIN I (cervical intraepithelial neoplasia I) 06/2005   Clitoral irritation 07/2006   Complication of anesthesia    severe vomiting   Fatigue    Fibrocystic breast 2007   Fullness of breast 01/2007   right   GERD (gastroesophageal reflux disease)    H/O seasonal allergies    H/O varicella    Headache(784.0)    migraines    Hx: UTI (urinary tract infection)    Increased BMI    Irregular bleeding 10/2005   Multiple sclerosis (HCC)    Thyroid disease    Fluxuate between hyper to hypo   Thyromegaly 11/2005   Vision abnormalities    Yeast vaginitis 07/2006    PAST SURGICAL HISTORY: Past Surgical History:  Procedure Laterality Date  CHOLECYSTECTOMY  09/11/91   DILATION AND CURETTAGE  OF UTERUS  2011   endometritis   DILATION AND CURETTAGE OF UTERUS  11/19/2011   Procedure: DILATATION AND CURETTAGE;  Surgeon: Hal Morales, MD;  Location: [facility];  Service: Gynecology;  Laterality: N/A;  dilitation and currettage with repair of intraoperative cervical laceration.   TONSILLECTOMY  10/28/97   WISDOM TOOTH EXTRACTION  2001    FAMILY HISTORY: Family History  Problem Relation Age of Onset   Heart disease Father    Alcohol abuse Father    Melanoma Father    Cancer Maternal Aunt        Thyroid & breast   Cancer Paternal Uncle        Esophageal   Diabetes Maternal Grandmother    Cancer Maternal Grandmother        Kidney   Cancer Paternal Grandmother    Hypothyroidism Mother    Goiter Mother    Multiple sclerosis Cousin     SOCIAL HISTORY:  Social History   Socioeconomic History   Marital status: Divorced    Spouse name: Not on file   Number of children: Not on file   Years of education: Not on file   Highest education level: Not on file  Occupational History   Not on file  Tobacco Use   Smoking status: Every Day    Packs/day: 1.50    Years: 10.00    Pack years: 15.00    Types: Cigarettes    Last attempt to quit: 02/13/2011    Years since quitting: 10.3   Smokeless tobacco: Never   Tobacco comments:    Has quit 3 times this year but reports due to anxiety now  Vaping Use   Vaping Use: Never used  Substance and Sexual Activity   Alcohol use: Yes    Alcohol/week: 2.0 standard drinks    Types: 2 Cans of beer per week   Drug use: No   Sexual activity: Yes    Partners: Male    Birth control/protection: I.U.D.  Other Topics Concern   Not on file  Social History Narrative   Not on file   Social Determinants of Health   Financial Resource Strain: Not on file  Food Insecurity: Not on file  Transportation Needs: Not on file  Physical Activity: Not on file  Stress: Not on file  Social Connections: Not on file  Intimate Partner Violence: Not on  file     PHYSICAL EXAM  Vitals:   05/31/21 1129  BP: (!) 144/93  Pulse: 85  Weight: 189 lb (85.7 kg)    Body mass index is 28.74 kg/m.   General: The patient is well-developed and well-nourished and in no acute distress.  She has reduced range of motion in the neck.  Mild tenderness in the lower lumbar spine   Neurologic Exam  Mental status: The patient is alert and oriented x 3 at the time of the examination. The patient has apparent normal recent and remote memory, with an apparently normal attention span and concentration ability.   Speech is normal.  Cranial nerves: Extraocular movements are full.  Color vision is symmetric.  Normal facial strength and sensation.   Trapezius and sternocleidomastoid strength is normal. No dysarthria is noted.   No obvious hearing deficits are noted.  Motor:  Muscle bulk is normal.   Muscle tone is normal her strength is 5/5 in the arms and legs now.    Sensory: She has intact touch sensation  in the arms but mildly reduced sensation in left leg to touch. .    Coordination: Cerebellar testing shows good finger-nose-finger but she has reduced heel-to-shin, left worse than right.  Gait and station: Station is normal.   Her gait was normal.  Tandem gait was mildly wide.  Reflexes: Deep tendon reflexes are normal in the arms but increased at knees and ankles, left > right.    No ankle clonus.  Plantar responses were flexor.    DIAGNOSTIC DATA (LABS, IMAGING, TESTING) - I reviewed patient records, labs, notes, testing and imaging myself where available.  Lab Results  Component Value Date   WBC 11.8 (H) 08/14/2019   HGB 16.0 (H) 08/14/2019   HCT 46.5 08/14/2019   MCV 91 08/14/2019   PLT 260 08/14/2019        ASSESSMENT AND PLAN   Multiple sclerosis (HCC)  Left optic neuritis  Other fatigue  OSA (obstructive sleep apnea)  POTS (postural orthostatic tachycardia syndrome)  Ankylosing spondylitis, unspecified site of spine  (HCC)  Other headache syndrome   1.   Her MS seems to be stable with no recent exacerbation.  Continue Cosentyx (secukinumab).  --- it was trialed in a phase 2 with some efficacy and apparent safety .  The methotrexate she is on may offer additional benefit.  Dr. Dierdre Forth checks labs.     2.    Fludrocortisone for possible POTS.   3.    Continue Adderall for ADD,  fatigue and amantadine 100 mg p.o. twice daily for the fatigue.   Consider an oral appliance for OSA.  She did not tolerate CPAP.  OSA was not severe enough for the inspire device. 4.    Combination of ankylosing spondylosis, MS related impairments and fatigue causes disability. 5.   She will return to see me in 6 months or sooner if there are new or worsening neurologic symptoms.   Karo Rog A. Epimenio Foot, MD, [facility] 05/31/2021, 11:50 AM Certified in Neurology, Clinical Neurophysiology, Sleep Medicine, Pain Medicine and Neuroimaging  [facility] [address] [place], [place] [address] [phone]

## 2021-06-04 DIAGNOSIS — F909 Attention-deficit hyperactivity disorder, unspecified type: Secondary | ICD-10-CM | POA: Diagnosis not present

## 2021-06-04 DIAGNOSIS — M459 Ankylosing spondylitis of unspecified sites in spine: Secondary | ICD-10-CM | POA: Diagnosis not present

## 2021-06-04 DIAGNOSIS — F418 Other specified anxiety disorders: Secondary | ICD-10-CM | POA: Diagnosis not present

## 2021-06-04 DIAGNOSIS — G35 Multiple sclerosis: Secondary | ICD-10-CM | POA: Diagnosis not present

## 2021-06-28 DIAGNOSIS — J4541 Moderate persistent asthma with (acute) exacerbation: Secondary | ICD-10-CM | POA: Diagnosis not present

## 2021-06-28 DIAGNOSIS — Z03818 Encounter for observation for suspected exposure to other biological agents ruled out: Secondary | ICD-10-CM | POA: Diagnosis not present

## 2021-06-28 DIAGNOSIS — B349 Viral infection, unspecified: Secondary | ICD-10-CM | POA: Diagnosis not present

## 2021-06-28 DIAGNOSIS — R059 Cough, unspecified: Secondary | ICD-10-CM | POA: Diagnosis not present

## 2021-10-26 DIAGNOSIS — H15121 Nodular episcleritis, right eye: Secondary | ICD-10-CM | POA: Diagnosis not present

## 2021-10-26 DIAGNOSIS — G35 Multiple sclerosis: Secondary | ICD-10-CM | POA: Diagnosis not present

## 2021-10-26 DIAGNOSIS — H15012 Anterior scleritis, left eye: Secondary | ICD-10-CM | POA: Diagnosis not present

## 2021-11-29 ENCOUNTER — Ambulatory Visit: Payer: BC Managed Care – PPO | Admitting: Neurology

## 2021-11-29 ENCOUNTER — Encounter: Payer: Self-pay | Admitting: Neurology

## 2021-11-29 VITALS — BP 139/89 | HR 83 | Ht 68.0 in | Wt 189.0 lb

## 2021-11-29 DIAGNOSIS — G90A Postural orthostatic tachycardia syndrome (POTS): Secondary | ICD-10-CM | POA: Insufficient documentation

## 2021-11-29 DIAGNOSIS — R5383 Other fatigue: Secondary | ICD-10-CM

## 2021-11-29 DIAGNOSIS — G4489 Other headache syndrome: Secondary | ICD-10-CM

## 2021-11-29 DIAGNOSIS — G35 Multiple sclerosis: Secondary | ICD-10-CM | POA: Diagnosis not present

## 2021-11-29 DIAGNOSIS — M459 Ankylosing spondylitis of unspecified sites in spine: Secondary | ICD-10-CM

## 2021-11-29 MED ORDER — AMPHETAMINE-DEXTROAMPHET ER 25 MG PO CP24: 25.0000 mg | ORAL_CAPSULE | Freq: Every morning | ORAL | 0 refills | Status: DC

## 2021-11-29 NOTE — Progress Notes (Signed)
[facility]  PATIENT: Brittney Harrison DOB: 1980/10/16  REFERRING DOCTOR OR PCP:  Farris Has (PCP); [facility] [phone] Neuro) SOURCE: Patient, notes from Dr. Renne Crigler, imaging and lab results, MRI images on PACS CD  _________________________________   HISTORICAL  CHIEF COMPLAINT:  Chief Complaint  Patient presents with   Follow-up    Rm 2, alone. Here for 6 month MS f/u, off DMT for MS. Pt having more breakthrough sx that last a couple days. Has had in increase in stress with moving. Had a AS flare up for 6 weeks. Has had increase in losso of balance, has had to use her cane more.     HISTORY OF PRESENT ILLNESS:  Brittney Harrison Is a 41 y.o. woman diagnosed with relapsing remitting multiple sclerosis in 2014.  She also has a rheumologic disorder (likely ankylosing spondylosis)  Update 11/29/2021: She is feeling good in general.    No MS exacerbations but had AS flare in April/May.    She is on Cosentyx (once a month self injectable) for her ankylosing spondylosis and she tolerates it well.   We had discussed trhe phase 2 in MS was successful but a phase 3 as never done.   Methotrexate was tried but did not help much and caused hair loss.    She switched off of Ocrevus which was porrly tolerated and less effective for her ankylosing spondylosis.   She had trouble with copaxone tolerability.  MRIs 11/2019 showed no new MS plaques.    She sees Dr. Dierdre Forth for her ankylosing spondylosis. and hydradenitis suppurativa.   She had once been C-ANCA positive   We chose Cosentyx as it can treat both illnesses.     She has occasional fevers felt related to her rheum issues.   She has fever unknown origin at times.     Gait and balance are mildly off and she has frequent stumbles and a fall every month or two.   She uses the bannister on stairs.  She has mild weakness in her legs.  She has had some episodes of foot dysesthesias but they can last < 1 day.    If she stands up up rapidly she  gets lightheaded .  She fainted a few times.   She was getting lightheaded upon standing 80% of the time.  Fludrocortisone has helped a lot.   She is on Adderall for ADD with benefit (PCP is writing).   She still has some fatigue.  Amantadine has not added much benefit so she stopped.     Vyvanse helped more for fatigue  had a high co-pay.     She has OSA but CPAP was poorly tolerated.   Mild OSA with an AHI = 9.9/hr. OSA was severe during REM sleep with a REM-AHI = 34.6  We discussed an oral appliance.    She get occasional migraines, best helped by prn indomethacin.   It helped a recent occipital headache as well.     MS History In May 2002, she had thoracic dysesthesias.   In 2003, while at college, she had severe fatigue and dropped out.    In 2004, she had a episode of right leg numbness x 3 weeks.   It built up over a period of one day.   She had no insurance and improved so did not seek medical opinion.   In 2005, she had more right leg issues and in 2006 had right hand numbness.    Fatigue was also worse.  She also began to note some milder cognitive issues in 2006 that worsened in 2008.  She had severe left leg cramping in 2007 associated with some weakness for a while.  She also noted right facial symptoms in 2008.     In October 2014, she had the onset of black spots in her vision.  An MRI was performed worrisome for MS and an LP showed CSF consistent with MS.   She also had more urinary incontinence around that time.    Initially, she saw Dr. Clarisse Gouge (who also saw her sister with IIH).   He referred her to Dr. Renne Crigler who started her on Copaxone.   She stopped Copaxone a year later for pregnancy and did not restart after delivery June 2016 due to needle fatigue.    To try to help both her rheumatologic issue and MS she was started on Aubagio but had GI issues and stopped.  She then was placed on Ocrevus but had low-grade fevers that was stopped (last infusion December 2020).   She was placed on  Cosentyx for ankylosing spondylosis and did well.  Secukinumab/Cosentyx was investigated as a treatment for MS and had some efficacy though it was not a strongly efficacious medication.  It did appear to be safe.   She started Cosentyx in 2021.    DATA MRIs of the brain dated 08/15/2013 and 11/12/2014 and the MRI of the cervical spine 02/21/2013. The MRIs of the brain show a stable pattern of T2/FLAIR hyperintense foci in the periventricular, juxtacortical and deep white matter consistent with MS. There is also a focus in the right cerebral peduncle. The brain stem appear normal. None of the foci enhanced. The MRI of the cervical spine shows a small posterior focus adjacent to C5-C6.  MRI of the brain 12/09/2016 showed no new lesions.  MRI of the brain 02/02/2019 showed no new lesions.  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  MRI 12/08/2019 brain showed T2/FLAIR hyperintense foci in the hemispheres in a pattern configuration consistent with chronic demyelinating plaque associated with multiple sclerosis.  None of the foci appear to be acute.  They did not enhance.  Compared to the MRI dated 01/20/2019, there are no new lesions.   There is abnormal signal near the junction of the right transverse sinus and sigmoid sinus.  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  It is unchanged compared to previous MRIs..   There were no acute findings and there is a normal enhancement pattern.  MRI cervical spine 12/08/2019 showed is a small T2 hyperintense focus posteriorly to the left adjacent to C6.  This was also noted on the MRI from 01/20/2019 and is consistent with a chronic demyelinating plaque associated with multiple sclerosis.       Mild multilevel degenerative changes as detailed above that do not lead to spinal stenosis or nerve root compression.  These are stable compared to the  previous MRI.[name]   No acute findings.  MRI brain 12/15/2020 showed multiple T2/FLAIR hyperintense foci in the periventricular, juxtacortical and deep white matter of the hemispheres consistent with chronic demyelinating plaque associated with multiple sclerosis.  The foci do not enhance nor appear to be acute.  Compared to the MRI from 12/08/2019, there are no new lesions.      Abnormal signal at the junction of the right transverse sinus and sigmoid sinus.  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   FH:   Two distant cousins have MS.     Two family members has had ALS a swell and several have fibromyalgia.  Her 62 yo grandmoither has dementia.    REVIEW OF SYSTEMS: Constitutional: No fevers, chills, sweats, or change in appetite.  She notes fatigue. Eyes: No visual changes, double vision, eye pain Ear, nose and throat: No hearing loss, ear pain, nasal congestion, sore throat Cardiovascular: No chest pain, palpitations Respiratory:  No shortness of breath at rest or with exertion.   No wheezes GastrointestinaI: No nausea, vomiting, diarrhea, abdominal pain, fecal incontinence Genitourinary:  No dysuria, urinary retention or frequency.  No nocturia. Musculoskeletal:  No neck pain, back pain Integumentary: No rash, pruritus, skin lesions Neurological: as above Psychiatric: No depression at this time.  No anxiety Endocrine: No palpitations, diaphoresis, change in appetite, change in weigh or increased thirst Hematologic/Lymphatic:  No anemia, purpura, petechiae. Allergic/Immunologic: No itchy/runny eyes, nasal congestion, recent allergic reactions, rashes  ALLERGIES: Allergies  Allergen Reactions   Codeine Nausea And Vomiting   Eye-Sed Ophthalmic [Zinc] Itching    Eye gel    Percocet [Oxycodone-Acetaminophen] Nausea And Vomiting    HOME MEDICATIONS:  Current Outpatient Medications:    albuterol (VENTOLIN HFA) 108 (90 Base) MCG/ACT  inhaler, as needed., Disp: , Rfl:    cetirizine (ZYRTEC) 10 MG tablet, Take 10 mg by mouth daily., Disp: , Rfl:    Cholecalciferol (VITAMIN D PO), Take 5,000 Units by mouth daily. , Disp: , Rfl:    COSENTYX SENSOREADY PEN 150 MG/ML SOAJ, Inject into the skin., Disp: , Rfl:    escitalopram (LEXAPRO) 20 MG tablet, Take 1 tablet (20 mg total) by mouth daily., Disp: 90 tablet, Rfl: 0   fludrocortisone (FLORINEF) 0.1 MG tablet, Take 1 tablet (0.1 mg total) by mouth daily., Disp: 30 tablet, Rfl: 11   indomethacin (INDOCIN) 25 MG capsule, Take 1 capsule (25 mg total) by mouth 3 (three) times daily with meals. (Patient taking differently: Take 25 mg by mouth 3 (three) times daily as needed.), Disp: 15 capsule, Rfl: 1   naproxen sodium (ALEVE) 220 MG tablet, Take 220 mg by mouth as needed. 2-4 tablets daily, Disp: , Rfl:    Secukinumab (COSENTYX) 150 MG/ML SOSY, Inject 1 Dose into the skin every 30 (thirty) days., Disp: , Rfl:    amphetamine-dextroamphetamine (ADDERALL XR) 25 MG 24 hr capsule, Take 1 capsule by mouth every morning., Disp: 90 capsule, Rfl: 0  PAST MEDICAL HISTORY: Past Medical History:  Diagnosis Date   Abnormal Pap smear 2007   CIN-1   ASCUS with positive high risk HPV 05/2005   Asthma    CIN I (cervical intraepithelial neoplasia I) 06/2005   Clitoral irritation 07/2006   Complication of anesthesia    severe vomiting   Fatigue    Fibrocystic breast 2007   Fullness of breast 01/2007   right   GERD (gastroesophageal reflux disease)    H/O seasonal allergies    H/O varicella    Headache(784.0)    migraines    Hx: UTI (urinary tract infection)    Increased BMI    Irregular bleeding 10/2005   Multiple sclerosis (HCC)    Thyroid disease    Fluxuate between hyper to hypo   Thyromegaly 11/2005   Vision abnormalities    Yeast vaginitis 07/2006    PAST SURGICAL HISTORY: Past Surgical History:  Procedure Laterality Date  CHOLECYSTECTOMY  09/11/91   DILATION AND CURETTAGE OF UTERUS   2011   endometritis   DILATION AND CURETTAGE OF UTERUS  11/19/2011   Procedure: DILATATION AND CURETTAGE;  Surgeon: Hal Morales, MD;  Location: [facility];  Service: Gynecology;  Laterality: N/A;  dilitation and currettage with repair of intraoperative cervical laceration.   TONSILLECTOMY  10/28/97   WISDOM TOOTH EXTRACTION  2001    FAMILY HISTORY: Family History  Problem Relation Age of Onset   Heart disease Father    Alcohol abuse Father    Melanoma Father    Cancer Maternal Aunt        Thyroid & breast   Cancer Paternal Uncle        Esophageal   Diabetes Maternal Grandmother    Cancer Maternal Grandmother        Kidney   Cancer Paternal Grandmother    Hypothyroidism Mother    Goiter Mother    Multiple sclerosis Cousin     SOCIAL HISTORY:  Social History   Socioeconomic History   Marital status: Divorced    Spouse name: Not on file   Number of children: Not on file   Years of education: Not on file   Highest education level: Not on file  Occupational History   Not on file  Tobacco Use   Smoking status: Every Day    Packs/day: 1.50    Years: 10.00    Total pack years: 15.00    Types: Cigarettes    Last attempt to quit: 02/13/2011    Years since quitting: 10.8   Smokeless tobacco: Never   Tobacco comments:    Has quit 3 times this year but reports due to anxiety now  Vaping Use   Vaping Use: Never used  Substance and Sexual Activity   Alcohol use: Yes    Alcohol/week: 2.0 standard drinks of alcohol    Types: 2 Cans of beer per week   Drug use: No   Sexual activity: Yes    Partners: Male    Birth control/protection: I.U.D.  Other Topics Concern   Not on file  Social History Narrative   Not on file   Social Determinants of Health   Financial Resource Strain: Not on file  Food Insecurity: Not on file  Transportation Needs: Not on file  Physical Activity: Not on file  Stress: Not on file  Social Connections: Not on file  Intimate Partner Violence:  Not on file     PHYSICAL EXAM  Vitals:   11/29/21 1128  BP: 139/89  Pulse: 83  Weight: 189 lb (85.7 kg)  Height: 5\' 8"  (1.727 m)    Body mass index is 28.74 kg/m.   General: The patient is well-developed and well-nourished and in no acute distress.  She has reduced range of motion in the neck.  Mild tenderness in the lower lumbar spine   Neurologic Exam  Mental status: The patient is alert and oriented x 3 at the time of the examination. The patient has apparent normal recent and remote memory, with an apparently normal attention span and concentration ability.   Speech is normal.  Cranial nerves: Extraocular movements are full.  Color vision is reduced OS.  Normal facial strength and sensation.   Trapezius and sternocleidomastoid strength is normal. No dysarthria is noted.   No obvious hearing deficits are noted.  Motor:  Muscle bulk is normal.   Muscle tone is normal her strength is 5/5 in the arms and legs now.  Sensory: She has intact touch sensation in the arms but mildly reduced sensation in left leg to touch. .    Coordination: Cerebellar testing shows good finger-nose-finger but she has reduced heel-to-shin, left worse than right.  Gait and station: Station is normal.   Her gait was normal.  Tandem gait was mildly wide.  Reflexes: Deep tendon reflexes are normal in the arms but increased at knees and ankles, left > right.    No ankle clonus    DIAGNOSTIC DATA (LABS, IMAGING, TESTING) - I reviewed patient records, labs, notes, testing and imaging myself where available.  Lab Results  Component Value Date   WBC 11.8 (H) 08/14/2019   HGB 16.0 (H) 08/14/2019   HCT 46.5 08/14/2019   MCV 91 08/14/2019   PLT 260 08/14/2019        ASSESSMENT AND PLAN   Multiple sclerosis (HCC)  Ankylosing spondylitis, unspecified site of spine (HCC)  Other fatigue  POTS (postural orthostatic tachycardia syndrome)  Other headache syndrome   1.   She has no new MS  exacerbations.    She will continue Cosentyx (secukinumab).  --- it was trialed in a phase 2 with some efficacy and apparent safety .   Dr. Dierdre Forth checks labs.     2.    Fludrocortisone for POTS.   3.    Continue Adderall for ADD,  fatigue  and consider change to Vyvanse when its generic next year   Consider an oral appliance for OSA.  She did not tolerate CPAP.  OSA was not severe enough for the inspire device. 4.    Combination of ankylosing spondylosis, MS related impairments and fatigue causes disability. 5.   She will return to see me in 6 months or sooner if there are new or worsening neurologic symptoms.   Dontavian Marchi A. Epimenio Foot, MD, [facility] 11/29/2021, 12:00 PM Certified in Neurology, Clinical Neurophysiology, Sleep Medicine, Pain Medicine and Neuroimaging  [facility] [address] [place], [place] [address] [phone]

## 2021-12-15 DIAGNOSIS — G35 Multiple sclerosis: Secondary | ICD-10-CM | POA: Diagnosis not present

## 2021-12-15 DIAGNOSIS — M450 Ankylosing spondylitis of multiple sites in spine: Secondary | ICD-10-CM | POA: Diagnosis not present

## 2021-12-15 DIAGNOSIS — I7300 Raynaud's syndrome without gangrene: Secondary | ICD-10-CM | POA: Diagnosis not present

## 2021-12-15 DIAGNOSIS — H15001 Unspecified scleritis, right eye: Secondary | ICD-10-CM | POA: Diagnosis not present

## 2022-01-11 DIAGNOSIS — F909 Attention-deficit hyperactivity disorder, unspecified type: Secondary | ICD-10-CM | POA: Diagnosis not present

## 2022-01-11 DIAGNOSIS — G35 Multiple sclerosis: Secondary | ICD-10-CM | POA: Diagnosis not present

## 2022-01-11 DIAGNOSIS — Z23 Encounter for immunization: Secondary | ICD-10-CM | POA: Diagnosis not present

## 2022-01-11 DIAGNOSIS — F418 Other specified anxiety disorders: Secondary | ICD-10-CM | POA: Diagnosis not present

## 2022-03-25 DIAGNOSIS — H15121 Nodular episcleritis, right eye: Secondary | ICD-10-CM | POA: Diagnosis not present

## 2022-03-25 DIAGNOSIS — H5213 Myopia, bilateral: Secondary | ICD-10-CM | POA: Diagnosis not present

## 2022-03-25 DIAGNOSIS — H15012 Anterior scleritis, left eye: Secondary | ICD-10-CM | POA: Diagnosis not present

## 2022-03-25 DIAGNOSIS — G35 Multiple sclerosis: Secondary | ICD-10-CM | POA: Diagnosis not present

## 2022-03-25 DIAGNOSIS — H2511 Age-related nuclear cataract, right eye: Secondary | ICD-10-CM | POA: Diagnosis not present

## 2022-04-15 DIAGNOSIS — F909 Attention-deficit hyperactivity disorder, unspecified type: Secondary | ICD-10-CM | POA: Diagnosis not present

## 2022-04-15 DIAGNOSIS — R3129 Other microscopic hematuria: Secondary | ICD-10-CM | POA: Diagnosis not present

## 2022-04-15 DIAGNOSIS — F418 Other specified anxiety disorders: Secondary | ICD-10-CM | POA: Diagnosis not present

## 2022-04-15 DIAGNOSIS — R109 Unspecified abdominal pain: Secondary | ICD-10-CM | POA: Diagnosis not present

## 2022-04-18 DIAGNOSIS — R109 Unspecified abdominal pain: Secondary | ICD-10-CM | POA: Diagnosis not present

## 2022-05-23 DIAGNOSIS — Z01419 Encounter for gynecological examination (general) (routine) without abnormal findings: Secondary | ICD-10-CM | POA: Diagnosis not present

## 2022-05-23 DIAGNOSIS — Z1151 Encounter for screening for human papillomavirus (HPV): Secondary | ICD-10-CM | POA: Diagnosis not present

## 2022-05-23 DIAGNOSIS — Z30432 Encounter for removal of intrauterine contraceptive device: Secondary | ICD-10-CM | POA: Diagnosis not present

## 2022-05-23 DIAGNOSIS — Z124 Encounter for screening for malignant neoplasm of cervix: Secondary | ICD-10-CM | POA: Diagnosis not present

## 2022-06-06 ENCOUNTER — Ambulatory Visit: Payer: BC Managed Care – PPO | Admitting: Neurology

## 2022-06-06 ENCOUNTER — Encounter: Payer: Self-pay | Admitting: Neurology

## 2022-06-06 VITALS — BP 123/87 | HR 85 | Ht 68.0 in | Wt 190.6 lb

## 2022-06-06 DIAGNOSIS — G35 Multiple sclerosis: Secondary | ICD-10-CM

## 2022-06-06 DIAGNOSIS — R5383 Other fatigue: Secondary | ICD-10-CM | POA: Diagnosis not present

## 2022-06-06 DIAGNOSIS — G90A Postural orthostatic tachycardia syndrome (POTS): Secondary | ICD-10-CM | POA: Diagnosis not present

## 2022-06-06 DIAGNOSIS — G4489 Other headache syndrome: Secondary | ICD-10-CM

## 2022-06-06 DIAGNOSIS — M542 Cervicalgia: Secondary | ICD-10-CM | POA: Insufficient documentation

## 2022-06-06 DIAGNOSIS — M459 Ankylosing spondylitis of unspecified sites in spine: Secondary | ICD-10-CM

## 2022-06-06 MED ORDER — INDOMETHACIN 25 MG PO CAPS: 25.0000 mg | ORAL_CAPSULE | Freq: Three times a day (TID) | ORAL | 5 refills | Status: DC

## 2022-06-06 NOTE — Progress Notes (Signed)
[facility]  PATIENT: Brittney Harrison DOB: July 29, 1980  REFERRING DOCTOR OR PCP:  London Pepper (PCP); Ala Bent [phone] Neuro) SOURCE: Patient, notes from Dr. Shelia Media, imaging and lab results, MRI images on PACS CD  _________________________________   HISTORICAL  CHIEF COMPLAINT:  Chief Complaint  Patient presents with   Follow-up    Rm 2, alone. Here for 6 month MS f/u, off DMT for MS. Pt having more breakthrough sx that last a couple days. Has had in increase in stress with moving. Had a AS flare up for 6 weeks. Has had increase in losso of balance, has had to use her cane more.     HISTORY OF PRESENT ILLNESS:  Brittney Harrison Is a 42 y.o. woman diagnosed with relapsing remitting multiple sclerosis in 2014.  She also has a rheumologic disorder (likely ankylosing spondylosis)  Update 06/06/2022: She has no exacerbation but has had more symptoms.  She has a low grade fever x the last month - not always better after ibuprofen.   Since then, her fatigue has done worse.    Of note, she has elevated temperatures x 5 years.      She is on Cosentyx for ankylosing spondylosis and hydradenitis suppurativa.    These have greatly helped.    Her MS is mild and Cosentyx likely helps (based on a phase 2).  She has no coughing, rhinitis.       Lab work was reviewed.  She had a severe occipital HA x 2 weeks and takes indomethacin when this occurs.      Takes Aleve/Ibuprofen for milder headaches.     Currently she has a moderate occipital HA and tenderness.                       No MS exacerbations but had AS flare in April/May 2023.    She is on Cosentyx (once a month self injectable) for her ankylosing spondylosis and she tolerates it well.   We had discussed trhe phase 2 in MS was successful but a phase 3 as never done.   Methotrexate was tried but did not help much and caused hair loss.    She switched off of Ocrevus which was porrly tolerated and less effective for her ankylosing  spondylosis.   She had trouble with copaxone tolerability.  MRIs 11/2019 showed no new MS plaques.    She sees Dr. Amil Amen for her ankylosing spondylosis. and hydradenitis suppurativa.   She had once been C-ANCA positive   We chose Cosentyx as it can treat both illnesses.     She has occasional fevers felt related to her rheum issues.   She has fever unknown origin at times.     Gait and balance are mildly off and she has frequent stumbles and a fall every month or two.   She uses the bannister on stairs.  She has mild weakness in her legs.  She has had some episodes of foot dysesthesias but they can last < 1 day.    If she stands up up rapidly she gets lightheaded and has had a couple faints.  Fludrocortisone has helped a lot.   She is on Adderall for ADD with benefit (PCP is writing).   She still has some fatigue.  Amantadine has not added much benefit so she stopped.    Vyvanse helped more for fatigue  had a high co-pay.     She has OSA but CPAP was poorly tolerated.  Mild OSA with an AHI = 9.9/hr. OSA was severe during REM sleep with a REM-AHI = 34.6  We discussed an oral appliance.    She has a second cousin with EDS.  Her joints are hypermobile.       MS History In May 2002, she had thoracic dysesthesias.   In 2003, while at college, she had severe fatigue and dropped out.    In 2004, she had a episode of right leg numbness x 3 weeks.   It built up over a period of one day.   She had no insurance and improved so did not seek medical opinion.   In 2005, she had more right leg issues and in 2006 had right hand numbness.    Fatigue was also worse.   She also began to note some milder cognitive issues in 2006 that worsened in 2008.  She had severe left leg cramping in 2007 associated with some weakness for a while.  She also noted right facial symptoms in 2008.     In October 2014, she had the onset of black spots in her vision.  An MRI was performed worrisome for MS and an LP showed CSF consistent  with MS.   She also had more urinary incontinence around that time.    Initially, she saw Dr. Catalina Gravel (who also saw her sister with IIH).   He referred her to Dr. Shelia Media who started her on Copaxone.   She stopped Copaxone a year later for pregnancy and did not restart after delivery June 2016 due to needle fatigue.    To try to help both her rheumatologic issue and MS she was started on Aubagio but had GI issues and stopped.  She then was placed on Ocrevus but had low-grade fevers that was stopped (last infusion December 2020).   She was placed on Cosentyx for ankylosing spondylosis and did well.  Secukinumab/Cosentyx was investigated as a treatment for MS and had some efficacy though it was not a strongly efficacious medication.  It did appear to be safe.   She started Cosentyx in 2021.    DATA MRIs of the brain dated 08/15/2013 and 11/12/2014 and the MRI of the cervical spine 02/21/2013. The MRIs of the brain show a stable pattern of T2/FLAIR hyperintense foci in the periventricular, juxtacortical and deep white matter consistent with MS. There is also a focus in the right cerebral peduncle. The brain stem appear normal. None of the foci enhanced. The MRI of the cervical spine shows a small posterior focus adjacent to C5-C6.  MRI of the brain 12/09/2016 showed no new lesions.  MRI of the brain 02/02/2019 showed no new lesions.  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  MRI 12/08/2019 brain showed T2/FLAIR hyperintense foci in the hemispheres in a pattern configuration consistent with chronic demyelinating plaque associated with multiple sclerosis.  None of the foci appear to be acute.  They did not enhance.  Compared to the MRI dated 01/20/2019, there are no new lesions.   There is abnormal signal near the junction of the right transverse sinus and sigmoid sinus.  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  It is unchanged compared to previous MRIs..   There were no acute findings and there is a normal enhancement pattern.  MRI cervical spine 12/08/2019 showed is a small T2 hyperintense focus posteriorly to  the left adjacent to C6.  This was also noted on the MRI from 01/20/2019 and is consistent with a chronic demyelinating plaque associated with multiple sclerosis.       Mild multilevel degenerative changes as detailed above that do not lead to spinal stenosis or nerve root compression.  These are stable compared to the previous MRI.[name]   No acute findings.  MRI brain 12/15/2020 showed multiple T2/FLAIR hyperintense foci in the periventricular, juxtacortical and deep white matter of the hemispheres consistent with chronic demyelinating plaque associated with multiple sclerosis.  The foci do not enhance nor appear to be acute.  Compared to the MRI from 12/08/2019, there are no new lesions.      Abnormal signal at the junction of the right transverse sinus and sigmoid sinus.  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   FH:   Two distant cousins have MS.     Two family members has had ALS a swell and several have fibromyalgia.  Her 80 yo grandmoither has dementia.    REVIEW OF SYSTEMS: Constitutional: No fevers, chills, sweats, or change in appetite.  She notes fatigue. Eyes: No visual changes, double vision, eye pain Ear, nose and throat: No hearing loss, ear pain, nasal congestion, sore throat Cardiovascular: No chest pain, palpitations Respiratory:  No shortness of breath at rest or with exertion.   No wheezes GastrointestinaI: No nausea, vomiting, diarrhea, abdominal pain, fecal incontinence Genitourinary:  No dysuria, urinary retention or frequency.  No nocturia. Musculoskeletal:  No neck pain, back pain Integumentary: No rash, pruritus, skin lesions Neurological: as above Psychiatric: No depression at this time.  No  anxiety Endocrine: No palpitations, diaphoresis, change in appetite, change in weigh or increased thirst Hematologic/Lymphatic:  No anemia, purpura, petechiae. Allergic/Immunologic: No itchy/runny eyes, nasal congestion, recent allergic reactions, rashes  ALLERGIES: Allergies  Allergen Reactions   Codeine Nausea And Vomiting   Eye-Sed Ophthalmic [Zinc] Itching    Eye gel    Percocet [Oxycodone-Acetaminophen] Nausea And Vomiting    HOME MEDICATIONS:  Current Outpatient Medications:    albuterol (VENTOLIN HFA) 108 (90 Base) MCG/ACT inhaler, as needed., Disp: , Rfl:    cetirizine (ZYRTEC) 10 MG tablet, Take 10 mg by mouth daily., Disp: , Rfl:    Cholecalciferol (VITAMIN D PO), Take 5,000 Units by mouth daily. , Disp: , Rfl:    COSENTYX SENSOREADY PEN 150 MG/ML SOAJ, Inject into the skin., Disp: , Rfl:    escitalopram (LEXAPRO) 20 MG tablet, Take 1 tablet (20 mg total) by mouth daily., Disp: 90 tablet, Rfl: 0   fludrocortisone (FLORINEF) 0.1 MG tablet, Take 1 tablet (0.1 mg total) by mouth daily., Disp: 30 tablet, Rfl: 11   indomethacin (INDOCIN) 25 MG capsule, Take 1 capsule (25 mg total) by mouth 3 (three) times daily with meals. (Patient taking differently: Take 25 mg by mouth 3 (three) times daily as needed.), Disp: 15 capsule, Rfl: 1   naproxen sodium (ALEVE) 220 MG tablet, Take 220 mg by mouth as needed. 2-4 tablets daily, Disp: , Rfl:    Secukinumab (COSENTYX) 150 MG/ML SOSY, Inject 1 Dose into the skin every 30 (thirty) days., Disp: , Rfl:    amphetamine-dextroamphetamine (ADDERALL XR) 25 MG 24 hr capsule, Take 1 capsule by mouth every morning., Disp: 90 capsule, Rfl: 0  PAST MEDICAL HISTORY: Past Medical History:  Diagnosis Date   Abnormal Pap smear 2007   CIN-1   ASCUS with positive high risk HPV  05/2005   Asthma    CIN I (cervical intraepithelial neoplasia I) 06/2005   Clitoral irritation 123456   Complication of anesthesia    severe vomiting   Fatigue    Fibrocystic  breast 2007   Fullness of breast 01/2007   right   GERD (gastroesophageal reflux disease)    H/O seasonal allergies    H/O varicella    Headache(784.0)    migraines    Hx: UTI (urinary tract infection)    Increased BMI    Irregular bleeding 10/2005   Multiple sclerosis ([place])    Thyroid disease    Fluxuate between hyper to hypo   Thyromegaly 11/2005   Vision abnormalities    Yeast vaginitis 07/2006    PAST SURGICAL HISTORY: Past Surgical History:  Procedure Laterality Date   CHOLECYSTECTOMY  09/11/91   DILATION AND CURETTAGE OF UTERUS  2011   endometritis   DILATION AND CURETTAGE OF UTERUS  11/19/2011   Procedure: DILATATION AND CURETTAGE;  Surgeon: Eldred Manges, MD;  Location: [facility];  Service: Gynecology;  Laterality: N/A;  dilitation and currettage with repair of intraoperative cervical laceration.   TONSILLECTOMY  10/28/97   WISDOM TOOTH EXTRACTION  2001    FAMILY HISTORY: Family History  Problem Relation Age of Onset   Heart disease Father    Alcohol abuse Father    Melanoma Father    Cancer Maternal Aunt        Thyroid & breast   Cancer Paternal Uncle        Esophageal   Diabetes Maternal Grandmother    Cancer Maternal Grandmother        Kidney   Cancer Paternal Grandmother    Hypothyroidism Mother    Goiter Mother    Multiple sclerosis Cousin     SOCIAL HISTORY:  Social History   Socioeconomic History   Marital status: Divorced    Spouse name: Not on file   Number of children: Not on file   Years of education: Not on file   Highest education level: Not on file  Occupational History   Not on file  Tobacco Use   Smoking status: Every Day    Packs/day: 1.50    Years: 10.00    Total pack years: 15.00    Types: Cigarettes    Last attempt to quit: 02/13/2011    Years since quitting: 10.8   Smokeless tobacco: Never   Tobacco comments:    Has quit 3 times this year but reports due to anxiety now  Vaping Use   Vaping Use: Never used  Substance and  Sexual Activity   Alcohol use: Yes    Alcohol/week: 2.0 standard drinks of alcohol    Types: 2 Cans of beer per week   Drug use: No   Sexual activity: Yes    Partners: Male    Birth control/protection: I.U.D.  Other Topics Concern   Not on file  Social History Narrative   Not on file   Social Determinants of Health   Financial Resource Strain: Not on file  Food Insecurity: Not on file  Transportation Needs: Not on file  Physical Activity: Not on file  Stress: Not on file  Social Connections: Not on file  Intimate Partner Violence: Not on file     PHYSICAL EXAM  Vitals:   11/29/21 1128  BP: 139/89  Pulse: 83  Weight: 189 lb (85.7 kg)  Height: '5\' 8"'$  (1.727 m)    Body mass index is 28.74 kg/m.  General: The patient is well-developed and well-nourished and in no acute distress.  She has reduced range of motion in the neck.  Mild tenderness in the lower lumbar spine   Neurologic Exam  Mental status: The patient is alert and oriented x 3 at the time of the examination. The patient has apparent normal recent and remote memory, with an apparently normal attention span and concentration ability.   Speech is normal.  Cranial nerves: Extraocular movements are full.  Color vision is reduced OS.  Normal facial strength and sensation.   Trapezius and sternocleidomastoid strength is normal. No dysarthria is noted.   No obvious hearing deficits are noted.  Motor:  Muscle bulk is normal.   Muscle tone is normal her strength is 5/5 in the arms and legs now.    Sensory: She has intact touch sensation in the arms but mildly reduced sensation in left leg to touch. .    Coordination: Cerebellar testing shows good finger-nose-finger but she has reduced heel-to-shin, left worse than right.  Gait and station: Station is normal.   Her gait was normal.  Tandem gait was mildly wide.  Reflexes: Deep tendon reflexes are normal in the arms but increased at knees and ankles, left > right.     No ankle clonus    DIAGNOSTIC DATA (LABS, IMAGING, TESTING) - I reviewed patient records, labs, notes, testing and imaging myself where available.  Lab Results  Component Value Date   WBC 11.8 (H) 08/14/2019   HGB 16.0 (H) 08/14/2019   HCT 46.5 08/14/2019   MCV 91 08/14/2019   PLT 260 08/14/2019        ASSESSMENT AND PLAN   Multiple sclerosis (HCC)  Ankylosing spondylitis, unspecified site of spine (HCC)  Other fatigue  POTS (postural orthostatic tachycardia syndrome)  Other headache syndrome   1.   She has no new MS exacerbations.  However, she notes building much more fatigue.  She has had a low-grade fever and her fatigue could be related.   She will continue Cosentyx (secukinumab).  --- it was trialed in a phase 2 with some efficacy and apparent safety .   Dr. Amil Amen checks labs.     2.    Fludrocortisone for POTS.   3.    Continue Adderall for ADD,  fatigue  and consider change to Vyvanse when its generic next year   Consider an oral appliance for OSA.  She did not tolerate CPAP.  OSA was not severe enough for the inspire device. 4.    Combination of ankylosing spondylosis, MS related impairments and fatigue causes disability. 5.   Bilateral splenius capitis trigger point injections with 80 mg Depo-Medrol and Marcaine using sterile technique.  Pain was better afterwards.  She also noted improved range of motion in her neck afterwards.   6.   Unclear what is causing her low-grade fever.   7.    She has hypermobile joints and has a relative with Ehlers-Danlos.  Some of her joint pain could be coming from this.  We will see if we can get the [facility] genetic test.   8.   She will return to see me in 6 months or sooner if there are new or worsening neurologic symptoms.  40-minute office visit with the majority of the time spent face-to-face for history and physical, discussion/counseling and decision-making.  Additional time with record review and  documentation.    Fujie Dickison A. Felecia Shelling, MD, [facility] [id number], [id number] PM Certified in Neurology, Clinical  Neurophysiology, Sleep Medicine, Pain Medicine and Neuroimaging  [facility] [address], [place], [place] [address] [phone]

## 2022-06-08 ENCOUNTER — Telehealth: Payer: Self-pay | Admitting: Neurology

## 2022-06-08 NOTE — Telephone Encounter (Signed)
Pt scheduled for MR brain w/wo and MR cervical spine w/wo contrast at [facility] for 06/14/22 at 2:30pm  [facility] auth# [id number] (06/07/22-07/06/22)

## 2022-06-14 ENCOUNTER — Encounter: Payer: Self-pay | Admitting: *Deleted

## 2022-06-14 ENCOUNTER — Ambulatory Visit: Payer: BC Managed Care – PPO

## 2022-06-14 DIAGNOSIS — G35 Multiple sclerosis: Secondary | ICD-10-CM

## 2022-06-14 MED ORDER — GADOBENATE DIMEGLUMINE 529 MG/ML IV SOLN
15.0000 mL | Freq: Once | INTRAVENOUS | Status: AC | PRN
  Administered 2022-06-14: 15 mL via INTRAVENOUS

## 2022-06-15 DIAGNOSIS — I7300 Raynaud's syndrome without gangrene: Secondary | ICD-10-CM | POA: Diagnosis not present

## 2022-06-15 DIAGNOSIS — M450 Ankylosing spondylitis of multiple sites in spine: Secondary | ICD-10-CM | POA: Diagnosis not present

## 2022-06-15 DIAGNOSIS — G35 Multiple sclerosis: Secondary | ICD-10-CM | POA: Diagnosis not present

## 2022-06-15 DIAGNOSIS — H15001 Unspecified scleritis, right eye: Secondary | ICD-10-CM | POA: Diagnosis not present

## 2022-06-15 DIAGNOSIS — Z111 Encounter for screening for respiratory tuberculosis: Secondary | ICD-10-CM | POA: Diagnosis not present

## 2022-06-21 LAB — LAB REPORT - SCANNED: EGFR: 106

## 2022-06-24 DIAGNOSIS — F17210 Nicotine dependence, cigarettes, uncomplicated: Secondary | ICD-10-CM | POA: Diagnosis not present

## 2022-06-24 DIAGNOSIS — R946 Abnormal results of thyroid function studies: Secondary | ICD-10-CM | POA: Diagnosis not present

## 2022-06-24 DIAGNOSIS — E559 Vitamin D deficiency, unspecified: Secondary | ICD-10-CM | POA: Diagnosis not present

## 2022-06-24 DIAGNOSIS — E785 Hyperlipidemia, unspecified: Secondary | ICD-10-CM | POA: Diagnosis not present

## 2022-06-24 DIAGNOSIS — Z0000 Encounter for general adult medical examination without abnormal findings: Secondary | ICD-10-CM | POA: Diagnosis not present

## 2022-06-24 DIAGNOSIS — G35 Multiple sclerosis: Secondary | ICD-10-CM | POA: Diagnosis not present

## 2022-07-12 ENCOUNTER — Other Ambulatory Visit: Payer: Self-pay | Admitting: Neurology

## 2022-07-12 NOTE — Telephone Encounter (Signed)
Last seen on 06/06/22 Follow up visit scheduled on 12/06/22

## 2022-07-13 NOTE — Telephone Encounter (Signed)
Checked status of lab order on [facility] portal. States specimen "received in lab".

## 2022-12-06 ENCOUNTER — Ambulatory Visit: Payer: Self-pay | Admitting: Neurology

## 2023-02-13 ENCOUNTER — Telehealth: Payer: Self-pay | Admitting: Neurology

## 2023-02-13 NOTE — Telephone Encounter (Signed)
LVM and sent mychart msg informing pt of need to reschedule 06/12/23 appt - MD out

## 2023-06-12 ENCOUNTER — Ambulatory Visit: Payer: Self-pay | Admitting: Neurology

## 2023-07-11 LAB — LAB REPORT - SCANNED: EGFR: 109

## 2023-08-10 ENCOUNTER — Telehealth: Payer: Self-pay | Admitting: Neurology

## 2023-08-10 NOTE — Telephone Encounter (Signed)
 Left message on patient voicemail that a slot is held on 08/16/23 @ 9:30am with Dr.Sater.  I asked patient to call and confirm.

## 2023-08-10 NOTE — Telephone Encounter (Signed)
 This pt was here for her MIL visit. Brittney Harrison its nearly impossible to get though via phone here. Has an appt for 12/2023 and is on wait list, but always goes to [facility] and she gets it too late. She would love a phone call when an appt becomes avail. And also checking to see if you all have anything sooner for her. She says its been a while since she's seen him

## 2023-08-10 NOTE — Telephone Encounter (Signed)
 Pt called back and she confirmed that appointment, slot booked.

## 2023-08-16 ENCOUNTER — Encounter: Payer: Self-pay | Admitting: Neurology

## 2023-08-16 ENCOUNTER — Ambulatory Visit (INDEPENDENT_AMBULATORY_CARE_PROVIDER_SITE_OTHER): Payer: Self-pay | Admitting: Neurology

## 2023-08-16 VITALS — BP 135/86 | HR 70 | Ht 68.0 in | Wt 195.2 lb

## 2023-08-16 DIAGNOSIS — G35 Multiple sclerosis: Secondary | ICD-10-CM

## 2023-08-16 DIAGNOSIS — G4489 Other headache syndrome: Secondary | ICD-10-CM | POA: Diagnosis not present

## 2023-08-16 DIAGNOSIS — M459 Ankylosing spondylitis of unspecified sites in spine: Secondary | ICD-10-CM

## 2023-08-16 DIAGNOSIS — G90A Postural orthostatic tachycardia syndrome (POTS): Secondary | ICD-10-CM | POA: Diagnosis not present

## 2023-08-16 DIAGNOSIS — G35D Multiple sclerosis, unspecified: Secondary | ICD-10-CM

## 2023-08-16 DIAGNOSIS — M542 Cervicalgia: Secondary | ICD-10-CM

## 2023-08-16 DIAGNOSIS — L732 Hidradenitis suppurativa: Secondary | ICD-10-CM

## 2023-08-16 MED ORDER — INDOMETHACIN 25 MG PO CAPS: 25.0000 mg | ORAL_CAPSULE | Freq: Three times a day (TID) | ORAL | 5 refills | Status: AC

## 2023-08-16 MED ORDER — FLUDROCORTISONE ACETATE 0.1 MG PO TABS: 100.0000 ug | ORAL_TABLET | Freq: Every day | ORAL | 3 refills | Status: AC

## 2023-08-16 MED ORDER — AMPHETAMINE-DEXTROAMPHETAMINE 10 MG PO TABS: 10.0000 mg | ORAL_TABLET | Freq: Every day | ORAL | 0 refills | Status: AC

## 2023-08-16 MED ORDER — METHYLPREDNISOLONE 4 MG PO TABS: ORAL_TABLET | ORAL | 0 refills | Status: DC

## 2023-08-16 NOTE — Progress Notes (Signed)
 [facility]  PATIENT: Brittney Harrison DOB: 09-21-1980  REFERRING DOCTOR OR PCP:  Ronna Coho (PCP); Arta Lark [phone] Neuro) SOURCE: Patient, notes from Dr. Schuyler Custard, imaging and lab results, MRI images on PACS CD  _________________________________   HISTORICAL  CHIEF COMPLAINT:  Chief Complaint  Patient presents with   Follow-up    RM 10, alone. Last seen 06/06/22. Wanting to get back on Florinef . Has insurance now. Was off of meds d/t no insurance. Ambulates with cane.    HISTORY OF PRESENT ILLNESS:  Brittney Harrison Is a 43 y.o. woman diagnosed with relapsing remitting multiple sclerosis in 2014.  She also has a rheumologic disorder (likely ankylosing spondylosis)  Update 08/16/2023: She has no exacerbation but has had more symptoms.  She has a low grade fever x the last month - not always better after ibuprofen .   Since then, her fatigue has done worse.    Of note, she has elevated temperatures x 5 years.      She is on Cosentyx for ankylosing spondylosis and hydradenitis suppurativa.    These have greatly helped.    Her MS is mild and Cosentyx likely helps (based on a phase 2).                No MS exacerbations but had AS flare in April/May 2023.  She was on Cosentyx for rheumatologic issues after she switched off of Ocrevus  which was porrly tolerated and less effective for her ankylosing spondylosis.   She had trouble with copaxone tolerability.  MRIs 11/2019 showed no new MS plaques.    She is having more hip pain.   Indomethacin  had helped a lot.    She will be seeing Dr. Rodell Citrin (was seeing Dr. Ebbie Goldmann) for her ankylosing spondylosis. and hydradenitis suppurativa.   She had once been C-ANCA positive   We chose Cosentyx as it can treat both illnesses.     She has occasional fevers felt related to her rheum issues.   She has fever unknown origin at times.     Gait and balance are mildly off and she has frequent stumbles and a fall every month or two.   Gait is worse when hot.   She uses the bannister on stairs.  She has mild weakness in her legs.  She has had some episodes of foot dysesthesias but they can last < 1 day.    If she stands up up rapidly she gets lightheaded and has had a couple faints.  Fludrocortisone  has helped a lot.   She is on Adderall for ADD with benefit (PCP is writing).   She still has some fatigue.  Amantadine  has not added much benefit so she stopped.    Vyvanse helped more for fatigue  had a high co-pay.     She has OSA but CPAP was poorly tolerated.   Mild OSA with an AHI = 9.9/hr. OSA was severe during REM sleep with a REM-AHI = 34.6  We discussed an oral appliance.    She has a second cousin with EDS.  Her joints are hypermobile.    She was able to get medical disability.         MS History In May 2002, she had thoracic dysesthesias.   In 2003, while at college, she had severe fatigue and dropped out.    In 2004, she had a episode of right leg numbness x 3 weeks.   It built up over a period of one day.  She had no insurance and improved so did not seek medical opinion.   In 2005, she had more right leg issues and in 2006 had right hand numbness.    Fatigue was also worse.   She also began to note some milder cognitive issues in 2006 that worsened in 2008.  She had severe left leg cramping in 2007 associated with some weakness for a while.  She also noted right facial symptoms in 2008.     In October 2014, she had the onset of black spots in her vision.  An MRI was performed worrisome for MS and an LP showed CSF consistent with MS.   She also had more urinary incontinence around that time.    Initially, she saw Dr. Juli Oas (who also saw her sister with IIH).   He referred her to Dr. Schuyler Custard who started her on Copaxone.   She stopped Copaxone a year later for pregnancy and did not restart after delivery June 2016 due to needle fatigue.    To try to help both her rheumatologic issue and MS she was started on Aubagio but had GI issues and stopped.  She  then was placed on Ocrevus  but had low-grade fevers that was stopped (last infusion December 2020).   She was placed on Cosentyx for ankylosing spondylosis and did well.  Secukinumab/Cosentyx was investigated as a treatment for MS and had some efficacy though it was not a strongly efficacious medication.  It did appear to be safe.   She started Cosentyx in 2021.    DATA MRIs of the brain dated 08/15/2013 and 11/12/2014 and the MRI of the cervical spine 02/21/2013. The MRIs of the brain show a stable pattern of T2/FLAIR hyperintense foci in the periventricular, juxtacortical and deep white matter consistent with MS. There is also a focus in the right cerebral peduncle. The brain stem appear normal. None of the foci enhanced. The MRI of the cervical spine shows a small posterior focus adjacent to C5-C6.  MRI of the brain 12/09/2016 showed no new lesions.  MRI of the brain 02/02/2019 showed no new lesions.  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  MRI 12/08/2019 brain showed T2/FLAIR hyperintense foci in the hemispheres in a pattern configuration consistent with chronic demyelinating plaque associated with multiple sclerosis.  None of the foci appear to be acute.  They did not enhance.  Compared to the MRI dated 01/20/2019, there are no new lesions.   There is abnormal signal near the junction of the right transverse sinus and sigmoid sinus.  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  It is unchanged compared to previous MRIs..   There were no acute findings and there is a normal enhancement pattern.  MRI cervical spine 12/08/2019 showed is a small T2 hyperintense focus posteriorly to the left adjacent to C6.  This was also noted on the MRI from 01/20/2019 and is consistent with a chronic demyelinating plaque associated with multiple sclerosis.       Mild multilevel degenerative  changes as detailed above that do not lead to spinal stenosis or nerve root compression.  These are stable compared to the previous MRI.Brittney Harrison   No acute findings.  MRI brain 12/15/2020 showed multiple T2/FLAIR hyperintense foci in the periventricular, juxtacortical and deep white matter of the hemispheres consistent with chronic demyelinating plaque associated with multiple sclerosis.  The foci  do not enhance nor appear to be acute.  Compared to the MRI from 12/08/2019, there are no new lesions.      Abnormal signal at the junction of the right transverse sinus and sigmoid sinus.  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   FH:   Two distant cousins have MS.     Two family members has had ALS a swell and several have fibromyalgia.  Her 16 yo grandmoither has dementia.    REVIEW OF SYSTEMS: Constitutional: No fevers, chills, sweats, or change in appetite.  She notes fatigue. Eyes: No visual changes, double vision, eye pain Ear, nose and throat: No hearing loss, ear pain, nasal congestion, sore throat Cardiovascular: No chest pain, palpitations Respiratory:  No shortness of breath at rest or with exertion.   No wheezes GastrointestinaI: No nausea, vomiting, diarrhea, abdominal pain, fecal incontinence Genitourinary:  No dysuria, urinary retention or frequency.  No nocturia. Musculoskeletal:  No neck pain, back pain Integumentary: No rash, pruritus, skin lesions Neurological: as above Psychiatric: No depression at this time.  No anxiety Endocrine: No palpitations, diaphoresis, change in appetite, change in weigh or increased thirst Hematologic/Lymphatic:  No anemia, purpura, petechiae. Allergic/Immunologic: No itchy/runny eyes, nasal congestion, recent allergic reactions, rashes  ALLERGIES: Allergies  Allergen Reactions   Codeine Nausea And Vomiting   Eye-Sed Ophthalmic [Zinc] Itching    Eye gel    Percocet [Oxycodone -Acetaminophen ] Nausea And  Vomiting    HOME MEDICATIONS:  Current Outpatient Medications:    albuterol  (VENTOLIN  HFA) 108 (90 Base) MCG/ACT inhaler, as needed., Disp: , Rfl:    amphetamine -dextroamphetamine  (ADDERALL XR) 25 MG 24 hr capsule, Take 1 capsule by mouth every morning., Disp: 90 capsule, Rfl: 0   atorvastatin (LIPITOR) 20 MG tablet, Take 20 mg by mouth daily., Disp: , Rfl:    cetirizine (ZYRTEC) 10 MG tablet, Take 10 mg by mouth daily., Disp: , Rfl:    Cholecalciferol (VITAMIN D PO), Take 50,000 Units by mouth once a week., Disp: , Rfl:    escitalopram  (LEXAPRO ) 20 MG tablet, Take 1 tablet (20 mg total) by mouth daily. (Patient taking differently: Take 10 mg by mouth daily. Currently on 10mg  but going to 20mg  in July 2025), Disp: 90 tablet, Rfl: 0   indomethacin  (INDOCIN ) 25 MG capsule, Take 1 capsule (25 mg total) by mouth 3 (three) times daily with meals., Disp: 30 capsule, Rfl: 5   naproxen sodium (ALEVE) 220 MG tablet, Take 220 mg by mouth as needed. 2-4 tablets daily, Disp: , Rfl:    COSENTYX SENSOREADY PEN 150 MG/ML SOAJ, Inject into the skin. (Patient not taking: Reported on 08/16/2023), Disp: , Rfl:    fludrocortisone  (FLORINEF ) 0.1 MG tablet, TAKE 1 TABLET BY MOUTH EVERY DAY (Patient not taking: Reported on 08/16/2023), Disp: 90 tablet, Rfl: 1   Secukinumab (COSENTYX) 150 MG/ML SOSY, Inject 1 Dose into the skin every 30 (thirty) days. (Patient not taking: Reported on 08/16/2023), Disp: , Rfl:   PAST MEDICAL HISTORY: Past Medical History:  Diagnosis Date   Abnormal Pap smear 2007   CIN-1   ASCUS with positive high risk HPV 05/2005   Asthma    CIN I (cervical intraepithelial neoplasia I) 06/2005   Clitoral irritation 07/2006   Complication of anesthesia    severe vomiting   Fatigue    Fibrocystic breast 2007   Fullness of breast 01/2007   right   GERD (gastroesophageal reflux disease)    H/O seasonal  allergies    H/O varicella    Headache(784.0)    migraines    Hx: UTI (urinary tract infection)     Increased BMI    Irregular bleeding 10/2005   Multiple sclerosis (HCC)    Thyroid  disease    Fluxuate between hyper to hypo   Thyromegaly 11/2005   Vision abnormalities    Yeast vaginitis 07/2006    PAST SURGICAL HISTORY: Past Surgical History:  Procedure Laterality Date   CHOLECYSTECTOMY  09/11/91   DILATION AND CURETTAGE OF UTERUS  2011   endometritis   DILATION AND CURETTAGE OF UTERUS  11/19/2011   Procedure: DILATATION AND CURETTAGE;  Surgeon: Stevenson Elbe, MD;  Location: [facility];  Service: Gynecology;  Laterality: N/A;  dilitation and currettage with repair of intraoperative cervical laceration.   TONSILLECTOMY  10/28/97   WISDOM TOOTH EXTRACTION  2001    FAMILY HISTORY: Family History  Problem Relation Age of Onset   Heart disease Father    Alcohol abuse Father    Melanoma Father    Cancer Maternal Aunt        Thyroid  & breast   Cancer Paternal Uncle        Esophageal   Diabetes Maternal Grandmother    Cancer Maternal Grandmother        Kidney   Cancer Paternal Grandmother    Hypothyroidism Mother    Goiter Mother    Multiple sclerosis Cousin     SOCIAL HISTORY:  Social History   Socioeconomic History   Marital status: Divorced    Spouse name: Not on file   Number of children: Not on file   Years of education: Not on file   Highest education level: Not on file  Occupational History   Not on file  Tobacco Use   Smoking status: Every Day    Current packs/day: 0.00    Average packs/day: 1.5 packs/day for 10.0 years (15.0 ttl pk-yrs)    Types: Cigarettes    Start date: 02/12/2001    Last attempt to quit: 02/13/2011    Years since quitting: 12.5   Smokeless tobacco: Never   Tobacco comments:    Has quit 3 times this year but reports due to anxiety now  Vaping Use   Vaping status: Never Used  Substance and Sexual Activity   Alcohol use: Yes    Alcohol/week: 2.0 standard drinks of alcohol    Types: 2 Cans of beer per week   Drug use: No   Sexual  activity: Yes    Partners: Male    Birth control/protection: I.U.D.  Other Topics Concern   Not on file  Social History Narrative   Not on file   Social Drivers of Health   Financial Resource Strain: Not on file  Food Insecurity: Not on file  Transportation Needs: Not on file  Physical Activity: Not on file  Stress: Not on file  Social Connections: Not on file  Intimate Partner Violence: Not on file     PHYSICAL EXAM  Vitals:   08/16/23 0914 08/16/23 0922  BP: 139/89 135/86  Pulse: 71 70  Weight: 195 lb 3.2 oz (88.5 kg)   Height: 5\' 8"  (1.727 m)     Body mass index is 29.68 kg/m.   General: The patient is well-developed and well-nourished and in no acute distress.  She has reduced range of motion in the neck.  Mild tenderness in the lower lumbar spine   Neurologic Exam  Mental status: The patient is  alert and oriented x 3 at the time of the examination. The patient has apparent normal recent and remote memory, with an apparently normal attention span and concentration ability.   Speech is normal.  Cranial nerves: Extraocular movements are full.  Color vision is reduced OS.  Normal facial strength and sensation.   Trapezius and sternocleidomastoid strength is normal. No dysarthria is noted.   No obvious hearing deficits are noted.  Motor:  Muscle bulk is normal.   Muscle tone is normal her strength is 5/5 in the arms and legs now.    Sensory: She has intact touch sensation in the arms but mildly reduced sensation in left leg to touch. .    Coordination: Cerebellar testing shows good finger-nose-finger but she has reduced heel-to-shin, left worse than right.  Gait and station: Station is normal.   Her gait is arthritic.  Her tandem gait is wide  Reflexes: Deep tendon reflexes are normal in the arms but increased at knees and ankles, left > right.    No ankle clonus    DIAGNOSTIC DATA (LABS, IMAGING, TESTING) - I reviewed patient records, labs, notes, testing and  imaging myself where available.  Lab Results  Component Value Date   WBC 11.8 (H) 08/14/2019   HGB 16.0 (H) 08/14/2019   HCT 46.5 08/14/2019   MCV 91 08/14/2019   PLT 260 08/14/2019        ASSESSMENT AND PLAN   Multiple sclerosis (HCC)  Ankylosing spondylitis, unspecified site of spine (HCC)  POTS (postural orthostatic tachycardia syndrome)  Other headache syndrome  Neck pain  Hydradenitis   1.   She has no new MS exacerbations.   She will try to get back on Cosentyx (secukinumab).  --- it was trialed in a phase 2 with some efficacy and apparent safety .   She has appointment with rheumatology soon     2.    Fludrocortisone  for POTS.   3.    Continue Adderall for ADD,  fatigue  and consider change to Vyvanse when its less expensive   Consider an oral appliance for OSA.  She did not tolerate CPAP.  OSA was not severe enough for the inspire device. 4.    Combination of ankylosing spondylosis, MS related impairments and fatigue causes disability. 5.    She will return to see me in 6 months or sooner if there are new or worsening neurologic symptoms.  This visit is part of a comprehensive longitudinal care medical relationship regarding the patients primary diagnosis of MS and related concerns.    Brittney Harrison Lat, MD, [facility] 08/16/2023, 9:57 AM Certified in Neurology, Clinical Neurophysiology, Sleep Medicine, Pain Medicine and Neuroimaging  [facility] [address], [address] [place], [place] [address] [phone]

## 2023-11-22 ENCOUNTER — Inpatient Hospital Stay

## 2023-11-22 ENCOUNTER — Inpatient Hospital Stay: Attending: Hematology and Oncology | Admitting: Hematology and Oncology

## 2023-11-22 VITALS — BP 151/79 | HR 80 | Temp 97.1°F | Resp 15 | Wt 188.5 lb

## 2023-11-22 DIAGNOSIS — R590 Localized enlarged lymph nodes: Secondary | ICD-10-CM | POA: Diagnosis not present

## 2023-11-22 DIAGNOSIS — J45909 Unspecified asthma, uncomplicated: Secondary | ICD-10-CM | POA: Insufficient documentation

## 2023-11-22 DIAGNOSIS — E079 Disorder of thyroid, unspecified: Secondary | ICD-10-CM | POA: Diagnosis not present

## 2023-11-22 DIAGNOSIS — G35 Multiple sclerosis: Secondary | ICD-10-CM | POA: Insufficient documentation

## 2023-11-22 DIAGNOSIS — D72829 Elevated white blood cell count, unspecified: Secondary | ICD-10-CM | POA: Insufficient documentation

## 2023-11-22 DIAGNOSIS — D72828 Other elevated white blood cell count: Secondary | ICD-10-CM

## 2023-11-22 LAB — CMP (CANCER CENTER ONLY)
ALT: 17 U/L (ref 0–44)
AST: 17 U/L (ref 15–41)
Albumin: 4.6 g/dL (ref 3.5–5.0)
Alkaline Phosphatase: 91 U/L (ref 38–126)
Anion gap: 7 (ref 5–15)
BUN: 10 mg/dL (ref 6–20)
CO2: 28 mmol/L (ref 22–32)
Calcium: 10 mg/dL (ref 8.9–10.3)
Chloride: 103 mmol/L (ref 98–111)
Creatinine: 0.78 mg/dL (ref 0.44–1.00)
GFR, Estimated: >60 mL/min (ref >60)
Glucose, Bld: 101 mg/dL — ABNORMAL HIGH (ref 70–99)
Potassium: 3.8 mmol/L (ref 3.5–5.1)
Sodium: 138 mmol/L (ref 135–145)
Total Bilirubin: 0.5 mg/dL (ref 0.0–1.2)
Total Protein: 7.7 g/dL (ref 6.5–8.1)

## 2023-11-22 LAB — CBC WITH DIFFERENTIAL (CANCER CENTER ONLY)
Abs Immature Granulocytes: 0.04 K/uL (ref 0.00–0.07)
Basophils Absolute: 0.1 K/uL (ref 0.0–0.1)
Basophils Relative: 1 %
Eosinophils Absolute: 0.2 K/uL (ref 0.0–0.5)
Eosinophils Relative: 1 %
HCT: 45.9 % (ref 36.0–46.0)
Hemoglobin: 15.9 g/dL — ABNORMAL HIGH (ref 12.0–15.0)
Immature Granulocytes: 0 %
Lymphocytes Relative: 40 %
Lymphs Abs: 5 K/uL — ABNORMAL HIGH (ref 0.7–4.0)
MCH: 30.5 pg (ref 26.0–34.0)
MCHC: 34.6 g/dL (ref 30.0–36.0)
MCV: 87.9 fL (ref 80.0–100.0)
Monocytes Absolute: 0.5 K/uL (ref 0.1–1.0)
Monocytes Relative: 4 %
Neutro Abs: 6.8 K/uL (ref 1.7–7.7)
Neutrophils Relative %: 54 %
Platelet Count: 275 K/uL (ref 150–400)
RBC: 5.22 MIL/uL — ABNORMAL HIGH (ref 3.87–5.11)
RDW: 13.9 % (ref 11.5–15.5)
WBC Count: 12.6 K/uL — ABNORMAL HIGH (ref 4.0–10.5)
nRBC: 0 % (ref 0.0–0.2)

## 2023-11-22 LAB — LACTATE DEHYDROGENASE: LDH: 103 U/L (ref 98–192)

## 2023-11-22 LAB — C-REACTIVE PROTEIN: CRP: <0.5 mg/dL (ref <1.0)

## 2023-11-22 LAB — SEDIMENTATION RATE: Sed Rate: 4 mm/h (ref 0–22)

## 2023-11-22 NOTE — Patient Instructions (Signed)
 Thank you for choosing [facility] to provide your care. If you have questions after your visit to [facility]), please contact this office at [phone] between 8:00 am and 4 pm and select the name of your provider. Voicemails left after 4:00 may not be returned until the next business day. Calls received after 4:30 pm will be answered by an off-site nursing triage line.    Prescription Refills: Please ask your pharmacy to contact us  directly for most prescription requests. Please contact the office directly to refill narcotics (pain medications). Allow 48-72 hours for refills.  Appointments: Please contact the [facility] scheduling department at [phone] if you have questions about [facility] appointment scheduling. Please contact the schedulers with any scheduling changes so your appointment can be rescheduled in a timely manner.  Central Radiology Scheduling for [facility] [phone]  Call to schedule procedures such as PET scans, CT scans, MRIs, ultrasound, etc.  To allow each patient quality time with our providers, please arrive 15-30 minutes before your scheduled appointment time to allow for registration.  If you are late for your appointment, you may be asked to reschedule. We strive to provide you with quality time with our providers, and arriving late affects you and other patients whose appointments are after yours. If you do not show up for multiple scheduled visits, you may be dismissed from the clinic at the provider's discretion.  Resources: [facility] Social Workers [phone] for additional information on assistance programs or assistance connecting with community support programs [place] [employer] [phone]: Information about food stamps, Medicaid, and utility assistance [employer] (534)003-2762: [employer] ride-sharing service for eligible riders who have a disability that prevents them from riding the fixed-route  bus. Medicare Rights Center (281) 800-9836: Helps people with Medicare understand their rights and benefits, navigate the Medicare system, and get the quality health care they deserve [employer] (737)009-4183: Assists patients to locate various types of support and financial assistance [facility] 1-800-813-HOPE 407 568 9681): Provides financial assistance, online support groups, medication/co-pay assistance. Transportation Assistance for [employer]: Please inform the provider's scheduler or support staff to refer you to [employer].  Again, thank you for choosing [facility] for your care

## 2023-11-22 NOTE — Progress Notes (Signed)
 [facility] [phone]   [phone]  INITIAL CONSULT NOTE  Patient Care Team: Kip Righter, MD as PCP - General (Family Medicine)  Hematological/Oncological History # Leukocytosis, Neutrophilic Predominate 08/14/2019: WBC 11.8, Hgb 16.0, MCV 91, Plt 260 07/11/2023: WBC 11.4, Hgb 15.2, MCV 91.6, Plt 287 11/22/2023: establish care with Dr. Federico   CHIEF COMPLAINTS/PURPOSE OF CONSULTATION:  Leukocytosis   HISTORY OF PRESENTING ILLNESS:  Brittney Harrison 43 y.o. female with medical history significant for multiple sclerosis, GERD, migraines, asthma, thyroid  disease, and tobacco use who presents for evaluation of leukocytosis.  On review of the previous records Brittney Harrison had labs drawn on 07/11/2023 which showed white blood cell 11.4, hemoglobin 15.2, MCV 91.6, and platelets of 287.  She also had leukocytosis on labs from 08/14/2019 at which time her white blood cell count was 11.8.  During both of these instances she had an elevation in both neutrophils and lymphocytes.  Due to concern for these findings the patient was referred to hematology for further evaluation and management.  On exam today Brittney Harrison reports that she has had elevations for the past 5 years.  She reports that she has also had issues with inflammation.  She has been having low-grade temperatures ranging from 99 to 100 F.  Reports he does have chills and some sweats at night.  She also smokes 1 pack/day.  She reports that she has lymph nodes in her neck that are swollen and hard like rocks.  She notes that she also has earaches.  She notes that she has some tenderness at the site of her breast and does have an upcoming mammogram.  She also has a history of ankle lysing spondylosis and hidradenitis suppurativa.  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  She notes that she has been suffering from brain fog and fatigue but does have sleep apnea which is untreated.  She did have  her tonsils and adenoids removed as a child.  She reports that she does have weakness in her legs due to her MS.  She notes that she does have an upcoming appointment with Dr. Jeannetta in rheumatology next month.  On further discussion she reports that her mother had COPD from smoking and her maternal grandmother had vitiligo.  Her dad died and had COVID and Alzheimer's.  She has 3 healthy children.  Otherwise she reports no recent changes to her health.  A full 10 point ROS is otherwise negative.  MEDICAL HISTORY:  Past Medical History:  Diagnosis Date   Abnormal Pap smear 2007   CIN-1   ASCUS with positive high risk HPV 05/2005   Asthma    CIN I (cervical intraepithelial neoplasia I) 06/2005   Clitoral irritation 07/2006   Complication of anesthesia    severe vomiting   Fatigue    Fibrocystic breast 2007   Fullness of breast 01/2007   right   GERD (gastroesophageal reflux disease)    H/O seasonal allergies    H/O varicella    Headache(784.0)    migraines    Hx: UTI (urinary tract infection)    Increased BMI    Irregular bleeding 10/2005   Multiple sclerosis (HCC)    Thyroid  disease    Fluxuate between hyper to hypo   Thyromegaly 11/2005   Vision abnormalities    Yeast vaginitis 07/2006    SURGICAL HISTORY: Past Surgical History:  Procedure Laterality Date   CHOLECYSTECTOMY  09/11/91   DILATION AND  CURETTAGE OF UTERUS  2011   endometritis   DILATION AND CURETTAGE OF UTERUS  11/19/2011   Procedure: DILATATION AND CURETTAGE;  Surgeon: Shanda SHAUNNA Muscat, MD;  Location: [facility];  Service: Gynecology;  Laterality: N/A;  dilitation and currettage with repair of intraoperative cervical laceration.   TONSILLECTOMY  10/28/97   WISDOM TOOTH EXTRACTION  2001    SOCIAL HISTORY: Social History   Socioeconomic History   Marital status: Divorced    Spouse name: Not on file   Number of children: Not on file   Years of education: Not on file   Highest education level: Not on file   Occupational History   Not on file  Tobacco Use   Smoking status: Every Day    Current packs/day: 0.00    Average packs/day: 1.5 packs/day for 10.0 years (15.0 ttl pk-yrs)    Types: Cigarettes    Start date: 02/12/2001    Last attempt to quit: 02/13/2011    Years since quitting: 12.7   Smokeless tobacco: Never   Tobacco comments:    Has quit 3 times this year but reports due to anxiety now  Vaping Use   Vaping status: Never Used  Substance and Sexual Activity   Alcohol use: Yes    Alcohol/week: 2.0 standard drinks of alcohol    Types: 2 Cans of beer per week   Drug use: No   Sexual activity: Yes    Partners: Male    Birth control/protection: I.U.D.  Other Topics Concern   Not on file  Social History Narrative   Not on file   Social Drivers of Health   Financial Resource Strain: Not on file  Food Insecurity: No Food Insecurity (11/22/2023)   Hunger Vital Sign    Worried About Running Out of Food in the Last Year: Never true    Ran Out of Food in the Last Year: Never true  Transportation Needs: No Transportation Needs (11/22/2023)   PRAPARE - Administrator, Civil Service (Medical): No    Lack of Transportation (Non-Medical): No  Physical Activity: Not on file  Stress: Not on file  Social Connections: Not on file  Intimate Partner Violence: Not At Risk (11/22/2023)   Humiliation, Afraid, Rape, and Kick questionnaire    Fear of Current or Ex-Partner: No    Emotionally Abused: No    Physically Abused: No    Sexually Abused: No    FAMILY HISTORY: Family History  Problem Relation Age of Onset   Heart disease Father    Alcohol abuse Father    Melanoma Father    Cancer Maternal Aunt        Thyroid  & breast   Cancer Paternal Uncle        Esophageal   Diabetes Maternal Grandmother    Cancer Maternal Grandmother        Harrison   Cancer Paternal Grandmother    Hypothyroidism Mother    Goiter Mother    Multiple sclerosis Cousin     ALLERGIES:  is  allergic to codeine, eye-sed ophthalmic [zinc], and percocet [oxycodone -acetaminophen ].  MEDICATIONS:  Current Outpatient Medications  Medication Sig Dispense Refill   albuterol  (VENTOLIN  HFA) 108 (90 Base) MCG/ACT inhaler as needed.     amphetamine -dextroamphetamine  (ADDERALL) 10 MG tablet Take 1 tablet (10 mg total) by mouth daily with breakfast. 60 tablet 0   atorvastatin (LIPITOR) 20 MG tablet Take 20 mg by mouth daily.     cetirizine (ZYRTEC) 10 MG tablet Take 10  mg by mouth daily.     Cholecalciferol (VITAMIN D PO) Take 50,000 Units by mouth once a week.     COSENTYX SENSOREADY PEN 150 MG/ML SOAJ Inject into the skin. (Patient not taking: Reported on 08/16/2023)     escitalopram  (LEXAPRO ) 20 MG tablet Take 1 tablet (20 mg total) by mouth daily. (Patient taking differently: Take 10 mg by mouth daily. Currently on 10mg  but going to 20mg  in July 2025) 90 tablet 0   fludrocortisone  (FLORINEF ) 0.1 MG tablet Take 1 tablet (100 mcg total) by mouth daily. 90 tablet 3   indomethacin  (INDOCIN ) 25 MG capsule Take 1 capsule (25 mg total) by mouth 3 (three) times daily with meals. 90 capsule 5   methylPREDNISolone  (MEDROL ) 4 MG tablet Taper from 6 pills po for one day to 1 pill po the last day over 6 days (Patient not taking: Reported on 11/22/2023) 21 tablet 0   naproxen sodium (ALEVE) 220 MG tablet Take 220 mg by mouth as needed. 2-4 tablets daily     Secukinumab (COSENTYX) 150 MG/ML SOSY Inject 1 Dose into the skin every 30 (thirty) days. (Patient not taking: Reported on 11/22/2023)     No current facility-administered medications for this visit.    REVIEW OF SYSTEMS:   All other systems were reviewed with the patient and are negative.  PHYSICAL EXAMINATION:  Vitals:   11/22/23 1354  BP: (!) 151/79  Pulse: 80  Resp: 15  Temp: (!) 97.1 F (36.2 C)  SpO2: 100%   Filed Weights   11/22/23 1354  Weight: 188 lb 8 oz (85.5 kg)    GENERAL: well appearing middle-age female in NAD  SKIN: skin  color, texture, turgor are normal, no rashes or significant lesions EYES: conjunctiva are pink and non-injected, sclera clear LUNGS: clear to auscultation and percussion with normal breathing effort HEART: regular rate & rhythm and no murmurs and no lower extremity edema Musculoskeletal: no cyanosis of digits and no clubbing  PSYCH: alert & oriented x 3, fluent speech NEURO: no focal motor/sensory deficits  LABORATORY DATA:  I have reviewed the data as listed    Latest Ref Rng & Units 11/22/2023    3:14 PM 08/14/2019    3:54 PM 01/07/2019    9:56 AM  CBC  WBC 4.0 - 10.5 K/uL 12.6  11.8  10.7   Hemoglobin 12.0 - 15.0 g/dL 84.0  83.9  84.3   Hematocrit 36.0 - 46.0 % 45.9  46.5  45.1   Platelets 150 - 400 K/uL 275  260  251        Latest Ref Rng & Units 11/22/2023    3:14 PM 01/07/2019    9:56 AM 06/17/2017    3:57 AM  CMP  Glucose 70 - 99 mg/dL 898  897  889   BUN 6 - 20 mg/dL 10  12  <5   Creatinine 0.44 - 1.00 mg/dL 9.21  9.26  9.33   Sodium 135 - 145 mmol/L 138  138  140   Potassium 3.5 - 5.1 mmol/L 3.8  4.3  4.2   Chloride 98 - 111 mmol/L 103  101  103   CO2 22 - 32 mmol/L 28  24  27    Calcium 8.9 - 10.3 mg/dL 89.9  9.7  9.0   Total Protein 6.5 - 8.1 g/dL 7.7  6.7    Total Bilirubin 0.0 - 1.2 mg/dL 0.5  0.3    Alkaline Phos 38 - 126 U/L 91  69  AST 15 - 41 U/L 17  17    ALT 0 - 44 U/L 17  17       ASSESSMENT & PLAN Brittney Harrison 43 y.o. female with medical history significant for multiple sclerosis, GERD, migraines, asthma, thyroid  disease, and tobacco use who presents for evaluation of leukocytosis.  After review of the labs, review of the records, and discussion with the patient the patients findings are most consistent with leukocytosis from a plethora of different sources including smoking, OSA, and inflammatory disease.  Neutrophilic predominant leukocytosis is a common hematological finding with numerous possible etiologies. Possible causes include chronic  inflammation, infection (transient or chronic), medication side effect (particularly steroids), myeloproliferative neoplasm, or idiopathic. The most common cause of chronic inflammation causing leukocytosis is cigarette smoking. Obstructive sleep apnea can also produce a similar pattern of leukocytosis. If there is no clear cause (or if there is an increase in other cell lines) a myeloproliferative neoplasm workup should be conducted with JAK2 w/ reflex and BCR/ABL FISH.  Idiopathic cases should be observed for stability.    # Leukocytosis, Neutrophilic Predominant  --today will order CBC, CMP, and LDH  --inflammatory workup with ESR and CRP  --encourage smoking cessation  --patient has known diagnosis of OSA but does not use a CPAP --no focal signs concerning for infection.   --will proceed with MPN workup to include JAK2 w/ reflex and BCR/ABL FISH   --RTC pending the results of the above studies.  # Cervical Lymph Nodes -- Lymph nodes of the neck are palpable but do not appear to be enlarged. -- Will order ultrasound of the cervical lymph nodes.  Orders Placed This Encounter  Procedures   US  Soft Tissue Head/Neck    Standing Status:   Future    Expected Date:   12/03/2023    Expiration Date:   11/25/2024    Reason for Exam (SYMPTOM  OR DIAGNOSIS REQUIRED):   Assess for lymphadenopathy of the neck    Preferred imaging location?:   [facility]   CBC with Differential (Cancer Center Only)    Standing Status:   Future    Number of Occurrences:   1    Expiration Date:   11/21/2024   CMP (Cancer Center only)    Standing Status:   Future    Number of Occurrences:   1    Expiration Date:   11/21/2024   Lactate dehydrogenase (LDH)    Standing Status:   Future    Number of Occurrences:   1    Expiration Date:   11/21/2024   Sedimentation rate    Standing Status:   Future    Number of Occurrences:   1    Expiration Date:   11/21/2024   Flow Cytometry, Peripheral Blood (Oncology)     Standing Status:   Future    Number of Occurrences:   1    Expiration Date:   11/21/2024   C-reactive protein    Standing Status:   Future    Number of Occurrences:   1    Expiration Date:   11/21/2024   JAK2 V617 reflex CALR/MPL/E12-15    Standing Status:   Future    Number of Occurrences:   1    Expiration Date:   11/21/2024   BCR-ABL1 FISH    Standing Status:   Future    Number of Occurrences:   1    Expected Date:   11/22/2023    Expiration Date:  11/21/2024    All questions were answered. The patient knows to call the clinic with any problems, questions or concerns.  A total of more than 60 minutes were spent on this encounter with face-to-face time and non-face-to-face time, including preparing to see the patient, ordering tests and/or medications, counseling the patient and coordination of care as outlined above.   Brittney IVAR Kidney, MD Department of Hematology/Oncology [facility] Phone: [phone] Pager: [phone] Email: [email or web address]  11/26/2023 4:20 PM

## 2023-11-23 LAB — SURGICAL PATHOLOGY

## 2023-11-27 LAB — FLOW CYTOMETRY

## 2023-11-30 NOTE — Progress Notes (Signed)
 Office Visit Note  Patient: Brittney Harrison             Date of Birth: 05-25-1980           MRN: [id number]             PCP: Kip Righter, MD Referring: Kip Righter, MD Visit Date: 12/01/2023 Occupation: @GUAROCC @  Subjective:  New Patient (Initial Visit) (Patient states she has a lot of pain and swells when she is in a flare. Patient states she gets flare ups in her hips. )   Discussed the use of AI scribe software for clinical note transcription with the patient, who gave verbal consent to proceed.  History of Present Illness   Brittney Harrison is a 43 year old female with axial spondyloarthritis and multiple sclerosis who presents for evaluation of persistent fever and leg pain.  She has experienced a persistent low-grade fever for the past two years. Initially intermittent, lasting from one to eight months, it has been continuous since September 2021. She associates the onset of the fever with a reaction to an infusion treatment for multiple sclerosis.  She describes a deep, persistent aching in her legs, likened to 'body aches with a fever, but ramped up.' This pain is constant, exacerbated by physical activity, and relieved when her legs are fully supported. The leg pain began coinciding with the constant fever.  She has a history of axial spondyloarthritis, diagnosed in 2018, affecting her lower back, neck, and hips. She has experienced four flare-ups in her hips, with the left side more affected, although it is bilateral. She was on Cosentyx, which was effective for her hidradenitis suppurativa and axial spondyloarthritis, but is currently not on it due to insurance issues.  She has multiple sclerosis, diagnosed in 2014, with symptoms including spasticity and leg weakness. She reports a spinal lesion and exacerbations of symptoms since 2020, including a significant episode of paresthesia from her hips down after a neck movement. Follows with Dr. Sater for this, not on current maintenance  although was though cosentyx may have been providing benefit as well.  She reports symptoms of postural orthostatic tachycardia syndrome since 2015, with dizziness upon standing and occasional syncope. She experiences blood pooling in her legs and heart rate changes. Raynaud's phenomenon affects her feet, buttocks, thighs, and heels, triggered by temperatures below 65 degrees.  She has hidradenitis suppurativa, primarily affecting her groin, with less severe involvement of her armpits and under her breasts. She notes a recent loss of pubic hair over the last two months.  She experiences night sweats, pruritus of the feet and armpits, and lymphadenopathy, which she associates with possible B cell symptoms. She is currently under evaluation by a hematologist for these symptoms.  She has early onset osteoarthritis, starting in her spine at age 70, with joint pain and stiffness, particularly in her knees and wrists. She also reports frequent subluxations of various joints, including her jaw and hips.  She has a family history of early menopause, with her mother and aunts experiencing menopause by age 63. She was told she was starting perimenopause at age 79, and she experiences regular menstrual periods, although her last period was three weeks late.       12/2018 HBV/HCV neg HIV neg  Activities of Daily Living:  Patient reports morning stiffness for 20 minutes.   Patient Reports nocturnal pain.  Difficulty dressing/grooming: Reports Difficulty climbing stairs: Reports Difficulty getting out of chair: Reports Difficulty using hands for taps, buttons, cutlery, and/or  writing: Reports  Review of Systems  Constitutional:  Positive for fatigue.  HENT:  Negative for mouth sores and mouth dryness.   Eyes:  Positive for dryness.  Respiratory:  Negative for shortness of breath.   Cardiovascular:  Negative for chest pain and palpitations.  Gastrointestinal:  Positive for diarrhea. Negative for  blood in stool and constipation.  Endocrine: Negative for increased urination.  Genitourinary:  Positive for involuntary urination.  Musculoskeletal:  Positive for joint pain, gait problem, joint pain, joint swelling, myalgias, muscle weakness, morning stiffness, muscle tenderness and myalgias.  Skin:  Positive for color change, hair loss and sensitivity to sunlight. Negative for rash.  Allergic/Immunologic: Positive for susceptible to infections.  Neurological:  Positive for dizziness and headaches.  Hematological:  Negative for swollen glands.  Psychiatric/Behavioral:  Positive for sleep disturbance. Negative for depressed mood. The patient is nervous/anxious.     PMFS History:  Patient Active Problem List   Diagnosis Date Noted   Neck pain 06/06/2022   POTS (postural orthostatic tachycardia syndrome) 11/29/2021   Other headache syndrome 11/25/2019   Fever 08/14/2019   Ankylosing spondylitis (HCC) 05/22/2019   High risk medication use 05/22/2019   Pneumonia 06/16/2017   Multifocal pneumonia 06/15/2017   Left optic neuritis 01/23/2017   Multiple sclerosis (HCC) 10/13/2016   Gait disturbance 10/13/2016   Other fatigue 10/13/2016   Numbness 10/13/2016   Urinary urgency 10/13/2016   Kell isoimmunization during pregnancy    AMA (advanced maternal age) multigravida 35+    Multiple sclerosis complicating pregnancy (HCC)    Advanced maternal age in multigravida    Vaginal delivery 11/19/2011   PPH (postpartum hemorrhage) 11/19/2011   Hyperthyroidism 07/27/2011   Asthma 07/27/2011   GERD (gastroesophageal reflux disease) 07/27/2011   IBS (irritable bowel syndrome) 07/27/2011   Migraines 07/27/2011   Tobacco abuse 07/27/2011    Past Medical History:  Diagnosis Date   Abnormal Pap smear 2007   CIN-1   ADHD    Ankylosing spondylitis (HCC)    ASCUS with positive high risk HPV 05/2005   Asthma    CIN I (cervical intraepithelial neoplasia I) 06/2005   Clitoral irritation 07/2006    Complication of anesthesia    severe vomiting   DDD (degenerative disc disease), cervical    DDD (degenerative disc disease), lumbar    Fatigue    Fibrocystic breast 2007   Fullness of breast 01/2007   right   GAD (generalized anxiety disorder)    GERD (gastroesophageal reflux disease)    H/O seasonal allergies    H/O varicella    Headache(784.0)    migraines    Hidradenitis suppurativa    Hx: UTI (urinary tract infection)    Increased BMI    Irregular bleeding 10/2005   Multiple sclerosis (HCC)    Occipital headache    POTS (postural orthostatic tachycardia syndrome)    Sleep apnea    Thyroid  disease    Fluxuate between hyper to hypo   Thyromegaly 11/2005   Vision abnormalities    Yeast vaginitis 07/2006    Family History  Problem Relation Age of Onset   Hypothyroidism Mother    Goiter Mother    Heart disease Father    Alcohol abuse Father    Melanoma Father    Diabetes Maternal Grandmother    Cancer Maternal Grandmother        Kidney   Cancer Paternal Grandmother    Cancer Maternal Aunt        Thyroid  & breast  Cancer Paternal Uncle        Esophageal   Multiple sclerosis Cousin    Past Surgical History:  Procedure Laterality Date   CHOLECYSTECTOMY  09/11/91   DILATION AND CURETTAGE OF UTERUS  2011   endometritis   DILATION AND CURETTAGE OF UTERUS  11/19/2011   Procedure: DILATATION AND CURETTAGE;  Surgeon: Shanda SHAUNNA Muscat, MD;  Location: [facility];  Service: Gynecology;  Laterality: N/A;  dilitation and currettage with repair of intraoperative cervical laceration.   TONSILLECTOMY  10/28/97   WISDOM TOOTH EXTRACTION  2001   Social History   Social History Narrative   Not on file   Immunization History  Administered Date(s) Administered   Moderna Sars-Covid-2 Vaccination 06/07/2019, 07/05/2019   Tdap 11/21/2011, 10/07/2014     Objective: Vital Signs: BP 129/81 (BP Location: Right Arm, Patient Position: Sitting, Cuff Size: Normal)   Pulse 79   Resp  14   Ht 5' 8.25 (1.734 m)   Wt 192 lb (87.1 kg)   LMP 11/28/2023   Breastfeeding No   BMI 28.98 kg/m    Physical Exam Eyes:     Conjunctiva/sclera: Conjunctivae normal.  Neck:     Comments: Anterior and deep cervical chain adenopathy. No posterior cervical adenopathy. Cardiovascular:     Rate and Rhythm: Normal rate and regular rhythm.  Pulmonary:     Effort: Pulmonary effort is normal.     Breath sounds: Normal breath sounds.  Skin:    General: Skin is warm and dry.  Neurological:     Mental Status: She is alert.  Psychiatric:        Mood and Affect: Mood normal.      Musculoskeletal Exam:  Shoulders full ROM no tenderness or swelling Elbows full ROM no tenderness or swelling Wrists full ROM no tenderness or swelling Fingers full ROM no tenderness or swelling Palpable muscle knots in the neck. Tenderness and tightness in the lower back. Tenderness in the middle of the buttock, femoral region, and anterior hip region. Hip normal internal and external rotation without pain, no tenderness to lateral hip palpation Knees full ROM no tenderness or swelling   Investigation: No additional findings.  Imaging: No results found.  Recent Labs: Lab Results  Component Value Date   WBC 12.6 (H) 11/22/2023   HGB 15.9 (H) 11/22/2023   PLT 275 11/22/2023   NA 138 11/22/2023   K 3.8 11/22/2023   CL 103 11/22/2023   CO2 28 11/22/2023   GLUCOSE 101 (H) 11/22/2023   BUN 10 11/22/2023   CREATININE 0.78 11/22/2023   BILITOT 0.5 11/22/2023   ALKPHOS 91 11/22/2023   AST 17 11/22/2023   ALT 17 11/22/2023   PROT 7.7 11/22/2023   ALBUMIN 4.6 11/22/2023   CALCIUM 10.0 11/22/2023   GFRAA 119 01/07/2019   QFTBGOLD Negative 10/13/2016   QFTBGOLDPLUS NEGATIVE 12/01/2023    Speciality Comments: No specialty comments available.  Procedures:  No procedures performed Allergies: Codeine, Eye-sed ophthalmic [zinc], and Percocet [oxycodone -acetaminophen ]   Assessment / Plan:      Visit Diagnoses: Ankylosing spondylitis, unspecified site of spine (HCC) - Plan: XR Pelvis 1-2 Views, XR HIPS BILAT W OR W/O PELVIS 3-4 VIEWS, IgG, IgA, IgM, C3 and C4 Axial spondyloarthritis with chronic low back, neck, and hip pain Chronic axial spondyloarthritis with exacerbations in hips. No obvious peripheral synovitis or dactylitis on exam today. But is having axial pain and stiffness. Previous effective treatment with Cosentyx, currently not on it due to insurance. - Plan  to restart Cosentyx 300 mg [place] monthly  Hidradenitis suppurativa involving groin, breasts, and axillae Hidradenitis suppurativa primarily in groin, with axillae and breast involvement. Cosentyx previously effective. - Reinitiate Cosentyx once prescription plan is active.  Multiple sclerosis with chronic leg weakness and spasticity Multiple sclerosis with leg weakness and spasticity, well-managed. Cosentyx considered beneficial.  Chronic low-grade fever and cervical lymphadenopathy, etiology under investigation Chronic low-grade fever and cervical lymphadenopathy for two years. Etiology under investigation with hematology consultation. - Also checking complements and immunoglobulins today  Autonomic dysfunction with postural orthostatic tachycardia syndrome (POTS) and Raynaud's phenomenon Autonomic dysfunction with POTS and Raynaud's. Symptoms include dizziness, fainting, and blood pooling. Raynaud's triggered by cold temperatures. Chronic fatigue with post-exertional malaise, exacerbated by fever and systemic symptoms.  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 Genetic testing inconclusive.  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  Scleritis, possible recurrence Scleritis with possible recurrence, initial episode in 2018.  Perimenopausal symptoms with irregular menses  and night sweats Perimenopausal symptoms with irregular menses and night sweats, family history of early menopause.        Orders: Orders Placed This Encounter  Procedures   XR Pelvis 1-2 Views   XR HIPS BILAT W OR W/O PELVIS 3-4 VIEWS   QuantiFERON-TB Gold Plus   IgG, IgA, IgM   C3 and C4   No orders of the defined types were placed in this encounter.    Follow-Up Instructions: No follow-ups on file.   Lonni LELON Ester, MD  Note - This record has been created using [employer].  Chart creation errors have been sought, but may not always  have been located. Such creation errors do not reflect on  the standard of medical care.

## 2023-12-01 ENCOUNTER — Ambulatory Visit

## 2023-12-01 ENCOUNTER — Encounter: Payer: Self-pay | Admitting: Internal Medicine

## 2023-12-01 ENCOUNTER — Ambulatory Visit: Attending: Internal Medicine | Admitting: Internal Medicine

## 2023-12-01 VITALS — BP 129/81 | HR 79 | Resp 14 | Ht 68.25 in | Wt 192.0 lb

## 2023-12-01 DIAGNOSIS — Z79899 Other long term (current) drug therapy: Secondary | ICD-10-CM | POA: Insufficient documentation

## 2023-12-01 DIAGNOSIS — M459 Ankylosing spondylitis of unspecified sites in spine: Secondary | ICD-10-CM | POA: Diagnosis not present

## 2023-12-01 DIAGNOSIS — R509 Fever, unspecified: Secondary | ICD-10-CM | POA: Diagnosis present

## 2023-12-02 LAB — BCR-ABL1 FISH
Cells Analyzed: 200
Cells Counted: 200

## 2023-12-04 LAB — JAK2 V617F RFX CALR/MPL/E12-15

## 2023-12-04 LAB — CALR +MPL + E12-E15  (REFLEX)

## 2023-12-05 ENCOUNTER — Ambulatory Visit (HOSPITAL_COMMUNITY)
Admission: RE | Admit: 2023-12-05 | Discharge: 2023-12-05 | Disposition: A | Discharge status: Home / Self Care | Source: Ambulatory Visit | Attending: Hematology and Oncology | Admitting: Hematology and Oncology

## 2023-12-05 DIAGNOSIS — D72828 Other elevated white blood cell count: Secondary | ICD-10-CM | POA: Diagnosis present

## 2023-12-06 LAB — QUANTIFERON-TB GOLD PLUS
Mitogen-NIL: 7.37 [IU]/mL
NIL: 0.02 [IU]/mL
QuantiFERON-TB Gold Plus: NEGATIVE
TB1-NIL: 0 [IU]/mL
TB2-NIL: 0 [IU]/mL

## 2023-12-06 LAB — IGG, IGA, IGM
IgG (Immunoglobin G), Serum: 1036 mg/dL (ref 600–1640)
IgM, Serum: 52 mg/dL (ref 50–300)
Immunoglobulin A: 208 mg/dL (ref 47–310)

## 2023-12-06 LAB — C3 AND C4
C3 Complement: 145 mg/dL (ref 83–193)
C4 Complement: 32 mg/dL (ref 15–57)

## 2023-12-12 ENCOUNTER — Telehealth: Payer: Self-pay

## 2023-12-12 ENCOUNTER — Other Ambulatory Visit (HOSPITAL_COMMUNITY): Payer: Self-pay

## 2023-12-12 DIAGNOSIS — L732 Hidradenitis suppurativa: Secondary | ICD-10-CM

## 2023-12-12 DIAGNOSIS — M459 Ankylosing spondylitis of unspecified sites in spine: Secondary | ICD-10-CM

## 2023-12-12 DIAGNOSIS — Z79899 Other long term (current) drug therapy: Secondary | ICD-10-CM

## 2023-12-12 NOTE — Telephone Encounter (Signed)
-----   Message from Lonni ORN [facility] sent at 12/10/2023 12:38 AM EDT ----- Brittney Harrison needs to get back on Cosentyx 300 mg [place] monthly. She was on treatment with Dr. Mai previously for overlapping indications axial spondyloarthritis and hidradenitis suppurativa but had change of insurance. She reported she is currently in the process of establishing a new insurance benefits. Thanks.

## 2023-12-15 ENCOUNTER — Ambulatory Visit: Payer: Self-pay | Admitting: Internal Medicine

## 2023-12-15 NOTE — Progress Notes (Signed)
 Lab results look normal with no measurable immunoglobulin deficiency.  Her TB screening was negative so no problem for the plan to restart Cosentyx.

## 2023-12-15 NOTE — Telephone Encounter (Signed)
 Called patient regarding insurance status. Unable to reach. Left VM requesting return call after reading MyChart message regarding possible next steps  Sherry Pennant, PharmD, MPH, BCPS, CPP Clinical Pharmacist [facility])

## 2023-12-21 ENCOUNTER — Ambulatory Visit (INDEPENDENT_AMBULATORY_CARE_PROVIDER_SITE_OTHER): Payer: Self-pay | Admitting: Neurology

## 2023-12-21 ENCOUNTER — Other Ambulatory Visit (HOSPITAL_COMMUNITY): Payer: Self-pay

## 2023-12-21 VITALS — BP 140/84 | HR 114 | Ht 68.5 in | Wt 191.5 lb

## 2023-12-21 DIAGNOSIS — G4489 Other headache syndrome: Secondary | ICD-10-CM

## 2023-12-21 DIAGNOSIS — M542 Cervicalgia: Secondary | ICD-10-CM

## 2023-12-21 DIAGNOSIS — G90A Postural orthostatic tachycardia syndrome (POTS): Secondary | ICD-10-CM | POA: Diagnosis not present

## 2023-12-21 DIAGNOSIS — R5383 Other fatigue: Secondary | ICD-10-CM

## 2023-12-21 DIAGNOSIS — M459 Ankylosing spondylitis of unspecified sites in spine: Secondary | ICD-10-CM

## 2023-12-21 DIAGNOSIS — G35 Multiple sclerosis: Secondary | ICD-10-CM

## 2023-12-21 DIAGNOSIS — L732 Hidradenitis suppurativa: Secondary | ICD-10-CM

## 2023-12-21 MED ORDER — BACLOFEN 10 MG PO TABS: 10.0000 mg | ORAL_TABLET | Freq: Three times a day (TID) | ORAL | 5 refills | Status: AC | PRN

## 2023-12-21 NOTE — Telephone Encounter (Signed)
 Called patient to check status of insurance change. Advised patient that we can apply for Cosentyx  PAP if she does not have a prescription drug plan. We will leave PAP application at front desk for patient to pick up and complete later today. She will bring printed copy of 2024 tax return at a later date.   Latria Mccarron C. Stepanie Gra[facility] [facility] PharmD Candidate Class of [phone]

## 2023-12-21 NOTE — Telephone Encounter (Signed)
 Spoke with patient. Cosentyx  PAP form placed up front for patient to sign. Prescriber form signed by Dr. Jeannetta.  Sherry Pennant, PharmD, MPH, BCPS, CPP Clinical Pharmacist [facility])

## 2023-12-21 NOTE — Telephone Encounter (Signed)
 Submitted Patient Assistance Application to [employer] for COSENTYX  SQ with patient portion, provider portion, insurance card copy, current medication list, and income documents. Will update patient when we receive a response.  Phone: [phone] Fax: [phone]   Sherry Pennant, PharmD, MPH, BCPS, CPP Clinical Pharmacist [facility])

## 2023-12-21 NOTE — Progress Notes (Signed)
 [facility]  PATIENT: Brittney Harrison DOB: [birth year]  REFERRING DOCTOR OR PCP:  [employer] (PCP); Damien Hives [phone] Neuro) SOURCE: Patient, notes from Dr. Hives, imaging and lab results, MRI images on PACS CD  _________________________________   HISTORICAL  CHIEF COMPLAINT:  Chief Complaint  Patient presents with   Follow-up    RM 11, alone. SM/Rheumologic disorder (likely ankylosing spondylosis)    HISTORY OF PRESENT ILLNESS:  Brittney Harrison Is a 43 y.o. woman diagnosed with relapsing remitting multiple sclerosis in 2014.  She also has a rheumologic disorder (likely ankylosing spondylosis)  Update 12/21/2023: No MS exacerbations but generally does worse in heat.    She also has ankylosing spondylosis and increased symptoms flare up at times.   She is going to go back on Cosentyx  for rheumatologic issues.   Ocrevus  which was porrly tolerated and less effective for her ankylosing spondylosis.   She had trouble with copaxone tolerability.  MRIs 11/2019 showed no new MS plaques.    She has switched to Dr. Jeannetta (Rheum)  She has no exacerbation but has had more symptoms.  She has a low grade fever x the last month - not always better after ibuprofen .   Since then, her fatigue has done worse.    Of note, she has elevated temperatures x 5 years.      She is on Cosentyx  for ankylosing spondylosis and hydradenitis suppurativa.    These have greatly helped.    Her MS is mild and Cosentyx  likely helps (based on a phase 2).                She is having more hip pain.   Indomethacin  had helped a lot.   She had once been C-ANCA positive   We chose Cosentyx  as it can treat both illnesses.     She has occasional fevers felt related to her rheum issues.   She has fever unknown origin at times.     Gait is reduced due to reduced balance . She has frequent stumbles and a fall every month or two.   Gait is worse when hot.  She uses the bannister on stairs.  She has mild weakness in her  legs.  She has had a few episodes of more severe leg spasticity.   She has had some episodes of foot dysesthesias but they can last < 1 day.    No fainting this year. Has occasional presyncope if stands up quickly .  She is on fludrocortisone  and tolerates it well.    She is on Adderall for ADD with benefit (PCP is writing).   She still has some fatigue.    Vyvanse helped more for fatigue  had a high co-pay.     She has OSA but CPAP was poorly tolerated.   Mild OSA with an AHI = 9.9/hr. OSA was severe during REM sleep with a REM-AHI = 34.6  We discussed an oral appliance.    She has swollen lymph nodes in neck and has neutrophilic leukocytosis (seeing Dr. Federico).  She continues to have low grade fevers.      She his having occipital HA off/on were severe a few weeks ago but better the past few days again.   She has had coital headaches as well.            MS History In May 2002, she had thoracic dysesthesias.   In 2003, while at college, she had severe fatigue and dropped out.  In 2004, she had a episode of right leg numbness x 3 weeks.   It built up over a period of one day.   She had no insurance and improved so did not seek medical opinion.   In 2005, she had more right leg issues and in 2006 had right hand numbness.    Fatigue was also worse.   She also began to note some milder cognitive issues in 2006 that worsened in 2008.  She had severe left leg cramping in 2007 associated with some weakness for a while.  She also noted right facial symptoms in 2008.     In October 2014, she had the onset of black spots in her vision.  An MRI was performed worrisome for MS and an LP showed CSF consistent with MS.   She also had more urinary incontinence around that time.    Initially, she saw Dr. Malcom (who also saw her sister with IIH).   He referred her to Dr. Clarice who started her on Copaxone.   She stopped Copaxone a year later for pregnancy and did not restart after delivery June 2016 due to needle  fatigue.    To try to help both her rheumatologic issue and MS she was started on Aubagio but had GI issues and stopped.  She then was placed on Ocrevus  but had low-grade fevers that was stopped (last infusion December 2020).   She was placed on Cosentyx  for ankylosing spondylosis and did well.  Secukinumab /Cosentyx  was investigated as a treatment for MS and had some efficacy though it was not a strongly efficacious medication.  It did appear to be safe.   She started Cosentyx  in 2021.    DATA MRIs of the brain dated 08/15/2013 and 11/12/2014 and the MRI of the cervical spine 02/21/2013. The MRIs of the brain show a stable pattern of T2/FLAIR hyperintense foci in the periventricular, juxtacortical and deep white matter consistent with MS. There is also a focus in the right cerebral peduncle. The brain stem appear normal. None of the foci enhanced. The MRI of the cervical spine shows a small posterior focus adjacent to C5-C6.  MRI of the brain 12/09/2016 showed no new lesions.  MRI of the brain 02/02/2019 showed no new lesions.  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  MRI 12/08/2019 brain showed T2/FLAIR hyperintense foci in the hemispheres in a pattern configuration consistent with chronic demyelinating plaque associated with multiple sclerosis.  None of the foci appear to be acute.  They did not enhance.  Compared to the MRI dated 01/20/2019, there are no new lesions.   There is abnormal signal near the junction of the right transverse sinus and sigmoid sinus.  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  It is unchanged compared to previous MRIs..   There were no acute findings and there is a normal enhancement pattern.  MRI cervical spine 12/08/2019 showed is a small T2 hyperintense focus posteriorly to the left adjacent to C6.  This was also noted on the MRI from 01/20/2019 and  is consistent with a chronic demyelinating plaque associated with multiple sclerosis.       Mild multilevel degenerative changes as detailed above that do not lead to spinal stenosis or nerve root compression.  These are stable compared to the previous MRI.SABRA   No acute findings.  MRI brain 12/15/2020 showed multiple  T2/FLAIR hyperintense foci in the periventricular, juxtacortical and deep white matter of the hemispheres consistent with chronic demyelinating plaque associated with multiple sclerosis.  The foci do not enhance nor appear to be acute.  Compared to the MRI from 12/08/2019, there are no new lesions.      Abnormal signal at the junction of the right transverse sinus and sigmoid sinus.  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  MRI cervical spine 06/14/2022 showed There is a T2 hyperintense focus posterolaterally to the left within the spinal cord adjacent to C6, unchanged in appearance compared to the 2021 MRI.  It does not enhance.    multilevel degenerative changes as detailed above that does not lead to spinal stenosis or nerve root compression.  There are endplate degenerative changes with edema and enhancement at C6-C7.  MRI brain 06/14/2022 T2/FLAIR hyperintense foci in the cerebral hemispheres, right cerebral peduncle and pons in a pattern consistent with chronic demyelinating plaque associated with multiple sclerosis. None of the foci enhanced or appear to be acute. Compared to the MRI from 12/15/2020, there were no new lesions.   FH:   Two distant cousins have MS.     Two family members has had ALS a swell and several have fibromyalgia.  Her 4 yo grandmoither has dementia.    REVIEW OF SYSTEMS: Constitutional: No fevers, chills, sweats, or change in appetite.  She notes fatigue. Eyes: No visual changes, double vision, eye pain Ear, nose and throat: No hearing loss, ear pain, nasal congestion, sore throat Cardiovascular: No chest pain,  palpitations Respiratory:  No shortness of breath at rest or with exertion.   No wheezes GastrointestinaI: No nausea, vomiting, diarrhea, abdominal pain, fecal incontinence Genitourinary:  No dysuria, urinary retention or frequency.  No nocturia. Musculoskeletal:  No neck pain, back pain Integumentary: No rash, pruritus, skin lesions Neurological: as above Psychiatric: No depression at this time.  No anxiety Endocrine: No palpitations, diaphoresis, change in appetite, change in weigh or increased thirst Hematologic/Lymphatic:  No anemia, purpura, petechiae. Allergic/Immunologic: No itchy/runny eyes, nasal congestion, recent allergic reactions, rashes  ALLERGIES: Allergies  Allergen Reactions   Codeine Nausea And Vomiting and Nausea Only   Eye-Sed Ophthalmic [Zinc] Itching    Eye gel    Percocet [Oxycodone -Acetaminophen ] Nausea And Vomiting    HOME MEDICATIONS:  Current Outpatient Medications:    albuterol  (VENTOLIN  HFA) 108 (90 Base) MCG/ACT inhaler, as needed., Disp: , Rfl:    amphetamine -dextroamphetamine  (ADDERALL) 10 MG tablet, Take 1 tablet (10 mg total) by mouth daily with breakfast., Disp: 60 tablet, Rfl: 0   atorvastatin (LIPITOR) 20 MG tablet, Take 20 mg by mouth daily., Disp: , Rfl:    baclofen  (LIORESAL ) 10 MG tablet, Take 1 tablet (10 mg total) by mouth 3 (three) times daily as needed for muscle spasms., Disp: 90 each, Rfl: 5   cetirizine (ZYRTEC) 10 MG tablet, Take 10 mg by mouth daily., Disp: , Rfl:    Cholecalciferol (VITAMIN D3) 250 MCG (10000 UT) capsule, Take 10,000 Units by mouth daily., Disp: , Rfl:    COSENTYX  SENSOREADY PEN 150 MG/ML SOAJ, Inject into the skin., Disp: , Rfl:    cyclobenzaprine (FLEXERIL) 5 MG tablet, 1 tablet at bedtime as needed Orally Once a day; Duration: 30 days, Disp: , Rfl:    escitalopram  (LEXAPRO ) 20 MG tablet, Take 1 tablet (20 mg total) by mouth daily., Disp: 90 tablet, Rfl: 0   fludrocortisone  (FLORINEF ) 0.1 MG tablet, Take  1 tablet  (100 mcg total) by mouth daily., Disp: 90 tablet, Rfl: 3   ibuprofen  (ADVIL ) 600 MG tablet, , Disp: , Rfl:    indomethacin  (INDOCIN ) 25 MG capsule, Take 1 capsule (25 mg total) by mouth 3 (three) times daily with meals., Disp: 90 capsule, Rfl: 5   naproxen sodium (ALEVE) 220 MG tablet, Take 220 mg by mouth as needed. 2-4 tablets daily, Disp: , Rfl:   PAST MEDICAL HISTORY: Past Medical History:  Diagnosis Date   Abnormal Pap smear 2007   CIN-1   ADHD    Ankylosing spondylitis (HCC)    ASCUS with positive high risk HPV 05/2005   Asthma    CIN I (cervical intraepithelial neoplasia I) 06/2005   Clitoral irritation 07/2006   Complication of anesthesia    severe vomiting   DDD (degenerative disc disease), cervical    DDD (degenerative disc disease), lumbar    Fatigue    Fibrocystic breast 2007   Fullness of breast 01/2007   right   GAD (generalized anxiety disorder)    GERD (gastroesophageal reflux disease)    H/O seasonal allergies    H/O varicella    Headache(784.0)    migraines    Hidradenitis suppurativa    Hx: UTI (urinary tract infection)    Increased BMI    Irregular bleeding 10/2005   Multiple sclerosis (HCC)    Occipital headache    POTS (postural orthostatic tachycardia syndrome)    Sleep apnea    Thyroid  disease    Fluxuate between hyper to hypo   Thyromegaly 11/2005   Vision abnormalities    Yeast vaginitis 07/2006    PAST SURGICAL HISTORY: Past Surgical History:  Procedure Laterality Date   CHOLECYSTECTOMY  09/11/91   DILATION AND CURETTAGE OF UTERUS  2011   endometritis   DILATION AND CURETTAGE OF UTERUS  11/19/2011   Procedure: DILATATION AND CURETTAGE;  Surgeon: Shanda SHAUNNA Muscat, MD;  Location: [facility];  Service: Gynecology;  Laterality: N/A;  dilitation and currettage with repair of intraoperative cervical laceration.   TONSILLECTOMY  10/28/97   WISDOM TOOTH EXTRACTION  2001    FAMILY HISTORY: Family History  Problem Relation Age of Onset    Hypothyroidism Mother    Goiter Mother    Heart disease Father    Alcohol abuse Father    Melanoma Father    Diabetes Maternal Grandmother    Cancer Maternal Grandmother        Kidney   Cancer Paternal Grandmother    Cancer Maternal Aunt        Thyroid  & breast   Cancer Paternal Uncle        Esophageal   Multiple sclerosis Cousin     SOCIAL HISTORY:  Social History   Socioeconomic History   Marital status: Divorced    Spouse name: Not on file   Number of children: Not on file   Years of education: Not on file   Highest education level: Not on file  Occupational History   Not on file  Tobacco Use   Smoking status: Every Day    Current packs/day: 0.00    Average packs/day: 1.5 packs/day for 10.0 years (15.0 ttl pk-yrs)    Types: Cigarettes    Start date: 02/12/2001    Last attempt to quit: 02/13/2011    Years since quitting: 12.8    Passive exposure: Past   Smokeless tobacco: Never   Tobacco comments:    Has quit 3 times this year but reports  due to anxiety now  Vaping Use   Vaping status: Never Used  Substance and Sexual Activity   Alcohol use: Yes    Comment: rarely   Drug use: No   Sexual activity: Yes    Partners: Male    Birth control/protection: I.U.D.  Other Topics Concern   Not on file  Social History Narrative   Not on file   Social Drivers of Health   Financial Resource Strain: Not on file  Food Insecurity: No Food Insecurity (11/22/2023)   Hunger Vital Sign    Worried About Running Out of Food in the Last Year: Never true    Ran Out of Food in the Last Year: Never true  Transportation Needs: No Transportation Needs (11/22/2023)   PRAPARE - Administrator, Civil Service (Medical): No    Lack of Transportation (Non-Medical): No  Physical Activity: Not on file  Stress: Not on file  Social Connections: Not on file  Intimate Partner Violence: Not At Risk (11/22/2023)   Humiliation, Afraid, Rape, and Kick questionnaire    Fear of Current  or Ex-Partner: No    Emotionally Abused: No    Physically Abused: No    Sexually Abused: No     PHYSICAL EXAM  Vitals:   12/21/23 1502  BP: (!) 140/84  Pulse: (!) 114  Weight: 191 lb 8 oz (86.9 kg)  Height: 5' 8.5 (1.74 m)    Body mass index is 28.69 kg/m.   General: The patient is well-developed and well-nourished and in no acute distress.  She has reduced range of motion in the neck.  Mild tenderness in the lower lumbar spine   Neurologic Exam  Mental status: The patient is alert and oriented x 3 at the time of the examination. The patient has apparent normal recent and remote memory, with an apparently normal attention span and concentration ability.   Speech is normal.  Cranial nerves: Extraocular movements are full.  Color vision is reduced OS.  Normal facial strength and sensation.   Trapezius and sternocleidomastoid strength is normal. No dysarthria is noted.   No obvious hearing deficits are noted.  Motor:  Muscle bulk is normal.   Muscle tone is normal her strength is 5/5 in the arms and legs now.    Sensory: She has intact touch sensation in the arms but mildly reduced sensation in left leg to touch. .    Coordination: Cerebellar testing shows good finger-nose-finger but she has reduced heel-to-shin, left worse than right.  Gait and station: Station is normal.   Her gait is arthritic.  Her tandem gait is wide.  No Romberg sign  Reflexes: Deep tendon reflexes are normal in the arms but increased at knees and ankles, left > right.    No ankle clonus    DIAGNOSTIC DATA (LABS, IMAGING, TESTING) - I reviewed patient records, labs, notes, testing and imaging myself where available.  Lab Results  Component Value Date   WBC 12.6 (H) 11/22/2023   HGB 15.9 (H) 11/22/2023   HCT 45.9 11/22/2023   MCV 87.9 11/22/2023   PLT 275 11/22/2023        ASSESSMENT AND PLAN   Multiple sclerosis (HCC)  Ankylosing spondylitis, unspecified site of spine (HCC)  POTS  (postural orthostatic tachycardia syndrome)  Other headache syndrome  Neck pain  Hydradenitis  Other fatigue   1.   She has no new MS exacerbations and MRI in 2024 was stable.   She will be getting back  on Cosentyx  (secukinumab ).  --- it was trialed in a phase 2 with some efficacy and apparent safety .   She has appointment with rheumatology soon     2.    Fludrocortisone  for POTS.   3.    Continue Adderall for ADD,  fatigue  and consider change to Vyvanse when its less expensive   Consider an oral appliance for OSA.  She did not tolerate CPAP.  OSA was not severe enough for the inspire device. 4.    Combination of ankylosing spondylosis, MS related impairments and fatigue causes disability. 5.    If occipital HA flares up again, consider TPI She will return to see me in 6 months or sooner if there are new or worsening neurologic symptoms.  This visit is part of a comprehensive longitudinal care medical relationship regarding the patients primary diagnosis of MS and related concerns.    Amani Marseille A. Vear, MD, [facility] 12/21/2023, 4:51 PM Certified in Neurology, Clinical Neurophysiology, Sleep Medicine, Pain Medicine and Neuroimaging  [facility] [address] [place], [place] [address] [phone]

## 2023-12-22 MED ORDER — COSENTYX UNOREADY 300 MG/2ML ~~LOC~~ SOAJ: 300.0000 mg | SUBCUTANEOUS | 0 refills | Status: AC

## 2023-12-22 MED ORDER — COSENTYX UNOREADY 300 MG/2ML ~~LOC~~ SOAJ: 300.0000 mg | SUBCUTANEOUS | 1 refills | Status: DC

## 2023-12-22 NOTE — Telephone Encounter (Signed)
 Received a fax from  [employer] regarding an approval for COSENTYX  SQ patient assistance from 12/21/2023 to 04/10/2024. Approval letter sent to scan center.  Phone: [phone] Fax: [phone]  Attempted to contact pt to discuss, left VoiceMail providing her with phone number to [employer] and advised that she reach out to schedule her first shipment. Direct office number provided should she have any further questions.

## 2023-12-22 NOTE — Addendum Note (Signed)
 Addended by: DAYNE SHERRY RAMAN on: 12/22/2023 11:06 AM   Modules accepted: Orders

## 2023-12-27 ENCOUNTER — Encounter: Admitting: Internal Medicine

## 2023-12-29 ENCOUNTER — Ambulatory Visit: Payer: Self-pay | Admitting: *Deleted

## 2023-12-29 NOTE — Telephone Encounter (Signed)
 TCT patient regarding recent lab results. No answer but was able to leave vm message. Advised to return call to [phone] to review her lab results.  My Chart message sent as well

## 2023-12-29 NOTE — Telephone Encounter (Signed)
-----   Message from Norleen ONEIDA Kidney IV sent at 12/24/2023 11:17 AM EDT ----- Please let Mrs. Hoogland know that she does have prominent bilateral lymphadenopathy in the neck, but the radiologist notes that these are most certainly benign/reactive to inflammation.  Encourage  patient to follow this up with her rheumatologist during her visit in October.  Otherwise her workup showed no concerning abnormalities.  No need for routine follow-up in our clinic, recommend she  continue to follow with rheumatology. ----- Message ----- From: Interface, Rad Results In Sent: 12/12/2023   7:56 AM EDT To: Norleen ONEIDA Kidney [place], MD

## 2024-01-29 ENCOUNTER — Ambulatory Visit: Admitting: Internal Medicine

## 2024-01-29 NOTE — Progress Notes (Deleted)
 Office Visit Note  Patient: Brittney Harrison             Date of Birth: 1980/11/01           MRN: [id number]             PCP: Kip Righter, MD Referring: Kip Righter, MD Visit Date: 01/29/2024   Subjective:  No chief complaint on file.   History of Present Illness: Brittney Harrison is a 43 y.o. female here for follow up ***   Previous HPI 12/01/23 Brittney Harrison is a 43 year old female with axial spondyloarthritis and multiple sclerosis who presents for evaluation of persistent fever and leg pain.   She has experienced a persistent low-grade fever for the past two years. Initially intermittent, lasting from one to eight months, it has been continuous since September 2021. She associates the onset of the fever with a reaction to an infusion treatment for multiple sclerosis.   She describes a deep, persistent aching in her legs, likened to 'body aches with a fever, but ramped up.' This pain is constant, exacerbated by physical activity, and relieved when her legs are fully supported. The leg pain began coinciding with the constant fever.   She has a history of axial spondyloarthritis, diagnosed in 2018, affecting her lower back, neck, and hips. She has experienced four flare-ups in her hips, with the left side more affected, although it is bilateral. She was on Cosentyx , which was effective for her hidradenitis suppurativa and axial spondyloarthritis, but is currently not on it due to insurance issues.   She has multiple sclerosis, diagnosed in 2014, with symptoms including spasticity and leg weakness. She reports a spinal lesion and exacerbations of symptoms since 2020, including a significant episode of paresthesia from her hips down after a neck movement. Follows with Dr. Sater for this, not on current maintenance although was though cosentyx  may have been providing benefit as well.   She reports symptoms of postural orthostatic tachycardia syndrome since 2015, with dizziness upon standing and  occasional syncope. She experiences blood pooling in her legs and heart rate changes. Raynaud's phenomenon affects her feet, buttocks, thighs, and heels, triggered by temperatures below 65 degrees.   She has hidradenitis suppurativa, primarily affecting her groin, with less severe involvement of her armpits and under her breasts. She notes a recent loss of pubic hair over the last two months.   She experiences night sweats, pruritus of the feet and armpits, and lymphadenopathy, which she associates with possible B cell symptoms. She is currently under evaluation by a hematologist for these symptoms.   She has early onset osteoarthritis, starting in her spine at age 25, with joint pain and stiffness, particularly in her knees and wrists. She also reports frequent subluxations of various joints, including her jaw and hips.   She has a family history of early menopause, with her mother and aunts experiencing menopause by age 31. She was told she was starting perimenopause at age 4, and she experiences regular menstrual periods, although her last period was three weeks late.         12/2018 HBV/HCV neg HIV neg   No Rheumatology ROS completed.   PMFS History:  Patient Active Problem List   Diagnosis Date Noted   Neck pain 06/06/2022   POTS (postural orthostatic tachycardia syndrome) 11/29/2021   Other headache syndrome 11/25/2019   Fever 08/14/2019   Ankylosing spondylitis (HCC) 05/22/2019   High risk medication use 05/22/2019   Pneumonia 06/16/2017  Multifocal pneumonia 06/15/2017   Left optic neuritis 01/23/2017   Multiple sclerosis 10/13/2016   Gait disturbance 10/13/2016   Other fatigue 10/13/2016   Numbness 10/13/2016   Urinary urgency 10/13/2016   Kell isoimmunization during pregnancy    AMA (advanced maternal age) multigravida 35+    Multiple sclerosis complicating pregnancy    Advanced maternal age in multigravida    Vaginal delivery 11/19/2011   PPH (postpartum  hemorrhage) 11/19/2011   Hyperthyroidism 07/27/2011   Asthma 07/27/2011   GERD (gastroesophageal reflux disease) 07/27/2011   IBS (irritable bowel syndrome) 07/27/2011   Migraines 07/27/2011   Tobacco abuse 07/27/2011    Past Medical History:  Diagnosis Date   Abnormal Pap smear 2007   CIN-1   ADHD    Ankylosing spondylitis (HCC)    ASCUS with positive high risk HPV 05/2005   Asthma    CIN I (cervical intraepithelial neoplasia I) 06/2005   Clitoral irritation 07/2006   Complication of anesthesia    severe vomiting   DDD (degenerative disc disease), cervical    DDD (degenerative disc disease), lumbar    Fatigue    Fibrocystic breast 2007   Fullness of breast 01/2007   right   GAD (generalized anxiety disorder)    GERD (gastroesophageal reflux disease)    H/O seasonal allergies    H/O varicella    Headache(784.0)    migraines    Hidradenitis suppurativa    Hx: UTI (urinary tract infection)    Increased BMI    Irregular bleeding 10/2005   Multiple sclerosis    Occipital headache    POTS (postural orthostatic tachycardia syndrome)    Sleep apnea    Thyroid  disease    Fluxuate between hyper to hypo   Thyromegaly 11/2005   Vision abnormalities    Yeast vaginitis 07/2006    Family History  Problem Relation Age of Onset   Hypothyroidism Mother    Goiter Mother    Heart disease Father    Alcohol abuse Father    Melanoma Father    Diabetes Maternal Grandmother    Cancer Maternal Grandmother        Kidney   Cancer Paternal Grandmother    Cancer Maternal Aunt        Thyroid  & breast   Cancer Paternal Uncle        Esophageal   Multiple sclerosis Cousin    Past Surgical History:  Procedure Laterality Date   CHOLECYSTECTOMY  09/11/91   DILATION AND CURETTAGE OF UTERUS  2011   endometritis   DILATION AND CURETTAGE OF UTERUS  11/19/2011   Procedure: DILATATION AND CURETTAGE;  Surgeon: Shanda SHAUNNA Muscat, MD;  Location: [facility];  Service: Gynecology;  Laterality: N/A;   dilitation and currettage with repair of intraoperative cervical laceration.   TONSILLECTOMY  10/28/97   WISDOM TOOTH EXTRACTION  2001   Social History   Social History Narrative   Not on file   Immunization History  Administered Date(s) Administered   Moderna Sars-Covid-2 Vaccination 06/07/2019, 07/05/2019   Tdap 11/21/2011, 10/07/2014     Objective: Vital Signs: There were no vitals taken for this visit.   Physical Exam   Musculoskeletal Exam: ***  CDAI Exam: CDAI Score: -- Patient Global: --; Provider Global: -- Swollen: --; Tender: -- Joint Exam 01/29/2024   No joint exam has been documented for this visit   There is currently no information documented on the homunculus. Go to the Rheumatology activity and complete the homunculus joint exam.  Investigation:  No additional findings.  Imaging: No results found.  Recent Labs: Lab Results  Component Value Date   WBC 12.6 (H) 11/22/2023   HGB 15.9 (H) 11/22/2023   PLT 275 11/22/2023   NA 138 11/22/2023   K 3.8 11/22/2023   CL 103 11/22/2023   CO2 28 11/22/2023   GLUCOSE 101 (H) 11/22/2023   BUN 10 11/22/2023   CREATININE 0.78 11/22/2023   BILITOT 0.5 11/22/2023   ALKPHOS 91 11/22/2023   AST 17 11/22/2023   ALT 17 11/22/2023   PROT 7.7 11/22/2023   ALBUMIN 4.6 11/22/2023   CALCIUM 10.0 11/22/2023   GFRAA 119 01/07/2019   QFTBGOLD Negative 10/13/2016   QFTBGOLDPLUS NEGATIVE 12/01/2023    Speciality Comments: No specialty comments available.  Procedures:  No procedures performed Allergies: Codeine, Eye-sed ophthalmic [zinc], and Percocet [oxycodone -acetaminophen ]   Assessment / Plan:     Visit Diagnoses: No diagnosis found.  ***  Orders: No orders of the defined types were placed in this encounter.  No orders of the defined types were placed in this encounter.    Follow-Up Instructions: No follow-ups on file.   Lonni LELON Ester, MD  Note - This record has been created using [employer].  Chart creation errors have been sought, but may not always  have been located. Such creation errors do not reflect on  the standard of medical care.

## 2024-01-30 ENCOUNTER — Encounter: Payer: Self-pay | Admitting: Internal Medicine

## 2024-01-30 ENCOUNTER — Ambulatory Visit: Attending: Internal Medicine | Admitting: Internal Medicine

## 2024-01-30 ENCOUNTER — Encounter: Payer: Self-pay | Admitting: Pharmacist

## 2024-01-30 VITALS — BP 141/91 | HR 89 | Temp 97.9°F | Resp 14 | Ht 68.0 in | Wt 193.8 lb

## 2024-01-30 DIAGNOSIS — G35D Multiple sclerosis, unspecified: Secondary | ICD-10-CM | POA: Diagnosis present

## 2024-01-30 DIAGNOSIS — K589 Irritable bowel syndrome without diarrhea: Secondary | ICD-10-CM | POA: Diagnosis present

## 2024-01-30 DIAGNOSIS — M459 Ankylosing spondylitis of unspecified sites in spine: Secondary | ICD-10-CM | POA: Diagnosis not present

## 2024-01-30 DIAGNOSIS — M7918 Myalgia, other site: Secondary | ICD-10-CM | POA: Insufficient documentation

## 2024-01-30 DIAGNOSIS — G90A Postural orthostatic tachycardia syndrome (POTS): Secondary | ICD-10-CM | POA: Diagnosis not present

## 2024-01-30 MED ORDER — PREDNISONE 5 MG PO TABS: ORAL_TABLET | ORAL | 0 refills | Status: AC

## 2024-01-30 NOTE — Patient Instructions (Signed)
 Please call [employer] to schedule Cosentyx  shipment. You have been approved  Program phone: [phone]

## 2024-01-30 NOTE — Progress Notes (Signed)
 Called patient to notify that she was approved for Cosentyx  patient assistance last month. Unable to reach. Left detialed VM with program's phone number  Sherry Pennant, PharmD, MPH, BCPS, CPP Clinical Pharmacist [facility])

## 2024-01-30 NOTE — Progress Notes (Signed)
 Office Visit Note  Patient: Brittney Harrison             Date of Birth: 1980-12-15           MRN: [id number]             PCP: Kip Righter, MD Referring: Kip Righter, MD Visit Date: 01/30/2024   Subjective:   Discussed the use of AI scribe software for clinical note transcription with the patient, who gave verbal consent to proceed.  History of Present Illness   Brittney Harrison is a 43 year old female with multiple sclerosis, ankylosing spondylitis, and hidradenitis suppurativa who presents with a flare-up of symptoms and medication management issues.  She reports redness and tightness in her knuckles, which she states typically precede a major flare of her ankylosing spondylitis. She recently had a sinus and eye infection, which was treated with antibiotics and steroid eye drops. She has not started Cosentyx  due to prescription plan issues.  Her multiple sclerosis symptoms are constant, with leg spasms worsened by heat and fever. She is taking baclofen  for these spasms, but it has not alleviated the constant achiness in her legs.  She reports her hidradenitis suppurativa is flaring, with constant lesions, particularly in the groin area. She was on antibiotics for an infected flare about two weeks ago. The last systemic steroid use was in March for a flare in her hip.   Previous HPI 12/01/23 Brittney Harrison is a 43 year old female with axial spondyloarthritis and multiple sclerosis who presents for evaluation of persistent fever and leg pain.   She has experienced a persistent low-grade fever for the past two years. Initially intermittent, lasting from one to eight months, it has been continuous since September 2021. She associates the onset of the fever with a reaction to an infusion treatment for multiple sclerosis.   She describes a deep, persistent aching in her legs, likened to 'body aches with a fever, but ramped up.' This pain is constant, exacerbated by physical activity, and relieved  when her legs are fully supported. The leg pain began coinciding with the constant fever.   She has a history of axial spondyloarthritis, diagnosed in 2018, affecting her lower back, neck, and hips. She has experienced four flare-ups in her hips, with the left side more affected, although it is bilateral. She was on Cosentyx , which was effective for her hidradenitis suppurativa and axial spondyloarthritis, but is currently not on it due to insurance issues.   She has multiple sclerosis, diagnosed in 2014, with symptoms including spasticity and leg weakness. She reports a spinal lesion and exacerbations of symptoms since 2020, including a significant episode of paresthesia from her hips down after a neck movement. Follows with Dr. Sater for this, not on current maintenance although was though cosentyx  may have been providing benefit as well.   She reports symptoms of postural orthostatic tachycardia syndrome since 2015, with dizziness upon standing and occasional syncope. She experiences blood pooling in her legs and heart rate changes. Raynaud's phenomenon affects her feet, buttocks, thighs, and heels, triggered by temperatures below 65 degrees.   She has hidradenitis suppurativa, primarily affecting her groin, with less severe involvement of her armpits and under her breasts. She notes a recent loss of pubic hair over the last two months.   She experiences night sweats, pruritus of the feet and armpits, and lymphadenopathy, which she associates with possible B cell symptoms. She is currently under evaluation by a hematologist for these symptoms.  She has early onset osteoarthritis, starting in her spine at age 30, with joint pain and stiffness, particularly in her knees and wrists. She also reports frequent subluxations of various joints, including her jaw and hips.   She has a family history of early menopause, with her mother and aunts experiencing menopause by age 39. She was told she was starting  perimenopause at age 39, and she experiences regular menstrual periods, although her last period was three weeks late.         12/2018 HBV/HCV neg HIV neg   Review of Systems  Constitutional:  Positive for fatigue.  HENT:  Negative for mouth sores and mouth dryness.   Eyes:  Positive for dryness.  Respiratory:  Negative for shortness of breath.   Cardiovascular:  Positive for palpitations. Negative for chest pain.  Gastrointestinal:  Positive for constipation and diarrhea. Negative for blood in stool.  Endocrine: Negative for increased urination.  Genitourinary:  Positive for involuntary urination.  Musculoskeletal:  Positive for joint pain, gait problem, joint pain, joint swelling, myalgias, muscle weakness, morning stiffness, muscle tenderness and myalgias.  Skin:  Positive for hair loss and sensitivity to sunlight. Negative for color change and rash.  Allergic/Immunologic: Positive for susceptible to infections.  Neurological:  Positive for dizziness and headaches.  Hematological:  Positive for swollen glands.  Psychiatric/Behavioral:  Positive for sleep disturbance. Negative for depressed mood. The patient is nervous/anxious.     PMFS History:  Patient Active Problem List   Diagnosis Date Noted   Myofascial pain 01/30/2024   Neck pain 06/06/2022   POTS (postural orthostatic tachycardia syndrome) 11/29/2021   Other headache syndrome 11/25/2019   Fever 08/14/2019   Ankylosing spondylitis (HCC) 05/22/2019   High risk medication use 05/22/2019   Pneumonia 06/16/2017   Multifocal pneumonia 06/15/2017   Left optic neuritis 01/23/2017   Multiple sclerosis 10/13/2016   Gait disturbance 10/13/2016   Other fatigue 10/13/2016   Numbness 10/13/2016   Urinary urgency 10/13/2016   Kell isoimmunization during pregnancy    AMA (advanced maternal age) multigravida 35+    Multiple sclerosis complicating pregnancy    Advanced maternal age in multigravida    Vaginal delivery 11/19/2011    PPH (postpartum hemorrhage) 11/19/2011   Hyperthyroidism 07/27/2011   Asthma 07/27/2011   GERD (gastroesophageal reflux disease) 07/27/2011   IBS (irritable bowel syndrome) 07/27/2011   Migraines 07/27/2011   Tobacco abuse 07/27/2011    Past Medical History:  Diagnosis Date   Abnormal Pap smear 2007   CIN-1   ADHD    Ankylosing spondylitis (HCC)    ASCUS with positive high risk HPV 05/2005   Asthma    CIN I (cervical intraepithelial neoplasia I) 06/2005   Clitoral irritation 07/2006   Complication of anesthesia    severe vomiting   DDD (degenerative disc disease), cervical    DDD (degenerative disc disease), lumbar    Fatigue    Fibrocystic breast 2007   Fullness of breast 01/2007   right   GAD (generalized anxiety disorder)    GERD (gastroesophageal reflux disease)    H/O seasonal allergies    H/O varicella    Headache(784.0)    migraines    Hidradenitis suppurativa    Hx: UTI (urinary tract infection)    Increased BMI    Irregular bleeding 10/2005   Multiple sclerosis    Occipital headache    POTS (postural orthostatic tachycardia syndrome)    Sleep apnea    Thyroid  disease    Fluxuate between  hyper to hypo   Thyromegaly 11/2005   Vision abnormalities    Yeast vaginitis 07/2006    Family History  Problem Relation Age of Onset   Hypothyroidism Mother    Goiter Mother    Heart disease Father    Alcohol abuse Father    Melanoma Father    Diabetes Maternal Grandmother    Cancer Maternal Grandmother        Kidney   Cancer Paternal Grandmother    Cancer Maternal Aunt        Thyroid  & breast   Cancer Paternal Uncle        Esophageal   Multiple sclerosis Cousin    Past Surgical History:  Procedure Laterality Date   CHOLECYSTECTOMY  09/11/91   DILATION AND CURETTAGE OF UTERUS  2011   endometritis   DILATION AND CURETTAGE OF UTERUS  11/19/2011   Procedure: DILATATION AND CURETTAGE;  Surgeon: Shanda SHAUNNA Muscat, MD;  Location: [facility];  Service: Gynecology;   Laterality: N/A;  dilitation and currettage with repair of intraoperative cervical laceration.   TONSILLECTOMY  10/28/97   WISDOM TOOTH EXTRACTION  2001   Social History   Social History Narrative   Not on file   Immunization History  Administered Date(s) Administered   Moderna Sars-Covid-2 Vaccination 06/07/2019, 07/05/2019   Tdap 11/21/2011, 10/07/2014     Objective: Vital Signs: BP (!) 141/91   Pulse 89   Temp 97.9 F (36.6 C)   Resp 14   Ht 5' 8 (1.727 m)   Wt 193 lb 12.8 oz (87.9 kg)   LMP 12/26/2023   BMI 29.47 kg/m    Physical Exam Eyes:     Conjunctiva/sclera: Conjunctivae normal.  Neck:     Comments: Nontender cervical adenopathy Cardiovascular:     Rate and Rhythm: Normal rate and regular rhythm.  Pulmonary:     Effort: Pulmonary effort is normal.     Breath sounds: Normal breath sounds.  Lymphadenopathy:     Cervical: Cervical adenopathy present.  Skin:    General: Skin is warm and dry.  Neurological:     Mental Status: She is alert.  Psychiatric:        Mood and Affect: Mood normal.      Musculoskeletal Exam:  Shoulders full ROM no tenderness or swelling Elbows full ROM no tenderness or swelling Wrists full ROM no tenderness or swelling Fingers full ROM no tenderness or swelling Palpable muscle knots in the neck. Tenderness and tightness in the upper and lower back Hip normal internal and external rotation without pain, left hip lateral tenderness to pressure Knees full ROM no tenderness or swelling  Investigation: No additional findings.  Imaging: No results found.  Recent Labs: Lab Results  Component Value Date   WBC 12.6 (H) 11/22/2023   HGB 15.9 (H) 11/22/2023   PLT 275 11/22/2023   NA 138 11/22/2023   K 3.8 11/22/2023   CL 103 11/22/2023   CO2 28 11/22/2023   GLUCOSE 101 (H) 11/22/2023   BUN 10 11/22/2023   CREATININE 0.78 11/22/2023   BILITOT 0.5 11/22/2023   ALKPHOS 91 11/22/2023   AST 17 11/22/2023   ALT 17 11/22/2023    PROT 7.7 11/22/2023   ALBUMIN 4.6 11/22/2023   CALCIUM 10.0 11/22/2023   GFRAA 119 01/07/2019   QFTBGOLD Negative 10/13/2016   QFTBGOLDPLUS NEGATIVE 12/01/2023    Speciality Comments: No specialty comments available.  Procedures:  No procedures performed Allergies: Codeine, Eye-sed ophthalmic [zinc], and Percocet [oxycodone -acetaminophen ]  Assessment / Plan:     Visit Diagnoses: Ankylosing spondylitis, unspecified site of spine (HCC) - Plan: predniSONE  (DELTASONE ) 5 MG tablet Active flare with knuckle redness and tightness, likely triggered by sinus infection and stress. Cosentyx  initiation delayed due to prescription plan issues. Prefers extended steroid taper. - Ensure prescription plan enrollment for Cosentyx  initiation but really waiting to start this to address underlying inflammation. - Short-term prednisone  taper starting at 30 mg daily down over 12 days for acute flareup  Occipital neuralgia, right side predominant Right-sided neuralgia likely due to muscle tightness from myofascial pain syndrome and ankylosing spondylitis flares.  Hidradenitis suppurativa with recurrent flares and secondary infection Recurrent flares with recent severe episode requiring antibiotics. Last systemic steroid use in March.  Multiple sclerosis with spasticity and heat sensitivity Persistent leg spasms worsened by heat. Baclofen  used for spasms, ineffective for achiness.  Joint hypermobility syndrome Myofascial pain syndrome with trigger points Chronic condition with trigger points in trapezius, rhomboids, and quadratus lumborum. Contributes to occipital neuralgia. Musculoskeletal pain and hypermobility with history of related symptoms with PTS/IBS/possible dysautonomia. - Refer to sports medicine for evaluation and management of hypermobility-related disorders, including potential dysautonomia and IBS.        Orders: No orders of the defined types were placed in this encounter.  Meds  ordered this encounter  Medications   predniSONE  (DELTASONE ) 5 MG tablet    Sig: Take 6 tablets (30 mg total) by mouth daily with breakfast for 2 days, THEN 5 tablets (25 mg total) daily with breakfast for 2 days, THEN 4 tablets (20 mg total) daily with breakfast for 2 days, THEN 3 tablets (15 mg total) daily with breakfast for 2 days, THEN 2 tablets (10 mg total) daily with breakfast for 2 days, THEN 1 tablet (5 mg total) daily with breakfast for 2 days.    Dispense:  42 tablet    Refill:  0     Follow-Up Instructions: Return in about 3 months (around 05/01/2024) for AS/MFS COS resume f/u 3mos.   Lonni LELON Ester, MD  Note - This record has been created using [employer].  Chart creation errors have been sought, but may not always  have been located. Such creation errors do not reflect on  the standard of medical care.

## 2024-02-07 NOTE — Addendum Note (Signed)
 Addended by: JEANNETTA LONNI ORN on: 02/07/2024 11:59 PM   Modules accepted: Orders

## 2024-02-22 ENCOUNTER — Ambulatory Visit (INDEPENDENT_AMBULATORY_CARE_PROVIDER_SITE_OTHER): Admitting: Family Medicine

## 2024-02-22 ENCOUNTER — Encounter: Payer: Self-pay | Admitting: Family Medicine

## 2024-02-22 VITALS — BP 152/100 | HR 94 | Ht 68.0 in | Wt 193.0 lb

## 2024-02-22 DIAGNOSIS — Q7962 Hypermobile Ehlers-Danlos syndrome: Secondary | ICD-10-CM | POA: Insufficient documentation

## 2024-02-22 DIAGNOSIS — M2550 Pain in unspecified joint: Secondary | ICD-10-CM | POA: Diagnosis not present

## 2024-02-22 DIAGNOSIS — G90A Postural orthostatic tachycardia syndrome (POTS): Secondary | ICD-10-CM

## 2024-02-22 NOTE — Progress Notes (Addendum)
   LILLETTE Ileana Collet, PhD, LAT, ATC acting as a scribe for Artist Lloyd, MD.  Brittney Harrison is a 43 y.o. female who presents to [facility] at [facility] today for evaluation of her hypermobility. Pt notes neurology did genetic testing. Hx of MS.   MS: Chronic joint pain, Chronic wide spread muscle pain, Joint Hypermobility, Joint Instability, Joint Subluxation, and Abnormal spinal curvature, ankylosing spondylitis,  Skin/Immune reactions: Hyperextensible/ stretchy Skin, Fragile Skin that tears easily, Soft Velvety Skin, Poor Wound Healing, Atrophic Scars, Abnormal Stretch Marks Before Puberty / without significant weight gain , Rashes / Hives, and Medication / Chemical / Food Sensitivities  Neurological: Headache  Migraine, Syncope / Pre-Syncope, Reduced Proprioception, Clumsiness , Burning, Numbness and/or Tingling in Extremities, Cognitive Dysfunction / Brain Fog, and Sleep Disturbance ANS: Fatigue Respiratory: Dyspnea CV: Tachycardia and Varicose Veins GI:  GERD, IBS with Diarrhea or Constipation, Painful ABD Bloating, Prolapse, and Hernia Genitourinary: Incontinence, Recurrent UTI / Cystitis, Tearing in the Vulvar region, and Prolapse Hands & Feet: Long Slender Fingers / Toes, Piezogenic Papules, and Flat Feet   Treatments tried: prior [facility], 2021 for 2.5 months,  Pertinent review of systems: No fevers or chills  Relevant historical information: History of ankylosing spondylitis, hidradenitis suppurativa, and multiple sclerosis.   Exam:  BP (!) 152/100   Pulse 94   Ht 5' 8 (1.727 m)   Wt 193 lb (87.5 kg)   LMP 12/26/2023   SpO2 97%   BMI 29.35 kg/m  General: Well Developed, well nourished, and in no acute distress.   MSK: Hypermobility evaluation: Beighton score +5/9. EDS evaluation positive score of 6 including unusually soft or velvety skin, mild skin hyperextensibility, unexplained stretch marks, bilateral piezogenic papules, atrophic scarring,  dental crowding    Lab and Radiology Results  Reviewed Ehlers-Danlos syndrome genetic panel from the [facility] lab from March 2024.  Patient had 22 genes detected typically associated with the variants of Ehlers-Danlos and Marfan's etc.  No reportable or pathological variants were identified.  Report will be sent to scanned in lab section.     Assessment and Plan: 43 y.o. 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  She is getting on Cosentyx  for her inflammatory conditions which I do think will help.  We did talk about management strategies for hypermobile Ehlers-Danlos.  Plan for physical therapy.  Additionally provided multiple different patient handouts and other information regarding EDS and his comorbidities including POTS.  Recheck in 1 month PDMP not reviewed this encounter. Orders Placed This Encounter  Procedures   Ambulatory referral to Physical Therapy    Referral Priority:   Routine    Referral Type:   Physical Medicine    Referral Reason:   Specialty Services Required    Requested Specialty:   Physical Therapy    Number of Visits Requested:   1   No orders of the defined types were placed in this encounter.    Discussed warning signs or symptoms. Please see discharge instructions. Patient expresses understanding.   The above documentation has been reviewed and is accurate and complete Artist Lloyd, M.D.

## 2024-02-22 NOTE — Telephone Encounter (Signed)
 Brittany from [employer] called to confirm if her meds were to be shipped to the office or the patients home advised it was going to patients house.

## 2024-02-22 NOTE — Patient Instructions (Addendum)
 Thank you for coming in today.   Check out the book Disjointed Navigating the Diagnosis and Management of Hypermobile Ehlers-Danlos Syndrome and Hypermobility Spectrum Disorders.   I've referred you to Physical Therapy.  Let us  know if you don't hear from them in one week.   Check back in 1 month

## 2024-03-18 ENCOUNTER — Ambulatory Visit: Admitting: Physical Therapy

## 2024-03-18 ENCOUNTER — Encounter: Payer: Self-pay | Admitting: Physical Therapy

## 2024-03-18 DIAGNOSIS — M357 Hypermobility syndrome: Secondary | ICD-10-CM

## 2024-03-18 DIAGNOSIS — M542 Cervicalgia: Secondary | ICD-10-CM | POA: Diagnosis not present

## 2024-03-18 DIAGNOSIS — M25552 Pain in left hip: Secondary | ICD-10-CM

## 2024-03-18 DIAGNOSIS — M6281 Muscle weakness (generalized): Secondary | ICD-10-CM | POA: Diagnosis not present

## 2024-03-18 NOTE — Therapy (Signed)
 OUTPATIENT PHYSICAL THERAPY HYPERMOBILITY EVAL  Patient Name: Brittney Harrison MRN: [id number] DOB:Sep 15, 1980, 43 y.o., female Today's Date: 03/18/2024  END OF SESSION:  PT End of Session - 03/18/24 0934     Visit Number 1    Number of Visits 16    Date for Recertification  05/13/24    Authorization Type MCR A and B    PT Start Time 0930    PT Stop Time 1020    PT Time Calculation (min) 50 min    Activity Tolerance Patient tolerated treatment well    Behavior During Therapy [facility] for tasks assessed/performed;Anxious          Past Medical History:  Diagnosis Date   Abnormal Pap smear 2007   CIN-1   ADHD    Ankylosing spondylitis (HCC)    ASCUS with positive high risk HPV 05/2005   Asthma    CIN I (cervical intraepithelial neoplasia I) 06/2005   Clitoral irritation 07/2006   Complication of anesthesia    severe vomiting   DDD (degenerative disc disease), cervical    DDD (degenerative disc disease), lumbar    Fatigue    Fibrocystic breast 2007   Fullness of breast 01/2007   right   GAD (generalized anxiety disorder)    GERD (gastroesophageal reflux disease)    H/O seasonal allergies    H/O varicella    Headache(784.0)    migraines    Hidradenitis suppurativa    Hx: UTI (urinary tract infection)    Increased BMI    Irregular bleeding 10/2005   Multiple sclerosis    Occipital headache    POTS (postural orthostatic tachycardia syndrome)    Sleep apnea    Thyroid  disease    Fluxuate between hyper to hypo   Thyromegaly 11/2005   Vision abnormalities    Yeast vaginitis 07/2006   Past Surgical History:  Procedure Laterality Date   CHOLECYSTECTOMY  09/11/91   DILATION AND CURETTAGE OF UTERUS  2011   endometritis   DILATION AND CURETTAGE OF UTERUS  11/19/2011   Procedure: DILATATION AND CURETTAGE;  Surgeon: Shanda SHAUNNA Muscat, MD;  Location: [facility];  Service: Gynecology;  Laterality: N/A;  dilitation and currettage with repair of intraoperative cervical laceration.    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   Myofascial pain 01/30/2024   Neck pain 06/06/2022   POTS (postural orthostatic tachycardia syndrome) 11/29/2021   Other headache syndrome 11/25/2019   Fever 08/14/2019   Ankylosing spondylitis (HCC) 05/22/2019   High risk medication use 05/22/2019   Pneumonia 06/16/2017   Multifocal pneumonia 06/15/2017   Left optic neuritis 01/23/2017   Multiple sclerosis 10/13/2016   Gait disturbance 10/13/2016   Other fatigue 10/13/2016   Numbness 10/13/2016   Urinary urgency 10/13/2016   Kell isoimmunization during pregnancy    AMA (advanced maternal age) multigravida 35+    Multiple sclerosis complicating pregnancy    Advanced maternal age in multigravida    Vaginal delivery 11/19/2011   PPH (postpartum hemorrhage) 11/19/2011   Hyperthyroidism 07/27/2011   Asthma 07/27/2011   GERD (gastroesophageal reflux disease) 07/27/2011   IBS (irritable bowel syndrome) 07/27/2011   Migraines 07/27/2011   Tobacco abuse 07/27/2011    PCP: Kip Righter, MD   REFERRING PROVIDER: Joane Birmingham MD   REFERRING DIAG: Joane Birmingham MD   Rationale for Evaluation and Treatment: Rehabilitation  THERAPY DIAG:  Hypermobility syndrome  Muscle weakness (generalized)  Pain in left hip  Cervicalgia  ONSET DATE: chronic   SUBJECTIVE:                                                                                                                                                                                           SUBJECTIVE STATEMENT: Patient was formally diagnosed with Ehlers-Danlos after a complete genetic workup see below.  She presents today for physical therapy evaluation at the request of Dr. Joane.   She originally brought it up to her Neurologist and did the testing with [facility].  Dr. Jeannetta referred to Dr. Joane who confirmed the  diagnosis.    She currently has difficulty with the following activities due to joint instability pain and fatigue:   Standing> 10 min, walking>20-30 min, uses cane, bending forward and coming back up. Diff walking up due to fatigue in mm (low back)  Headaches, pain with cervical motions.  Pain in wrist/hand with Writing.  Clumsy.   Areas most affected are her L hip, low back and Neck Spasticity in her Rt LE>  She would like to be able to stand at a concert and be able to do things without a flare or a 4 day .  I have to plan my life around my pain.   She has had physical therapy before (2021) and results were decent.   She foes a lot of research and is very knowledgeable about her conditions.    Patient reports symptoms and/or instability in the following areas:  - Cervical Spine: [x] Pain [x] Instability [] Limited ROM [] Paresthesia  - Thoracic Spine: [] Pain [] Instability  - Lumbar Spine: [x] Pain [x] Instability [] Frequent locking/giving out  - Shoulder: [] L [] R  [] Subluxation [] Dislocations [] Pain [] Fatigue  - Elbow: [] L [] R  [] Instability [] Hyperextension [] Pain  - Wrist/Hand: [] L [x] R  [] Instability [] Fatigue with use [x] Pain due to OA , pain with gripping, writing. Tight when flared with AS  - Hip: [x] L [x] R  [x] Instability [x] Pain [] Clicking [] Gait deviations , sublux Lt hip  - Knee: [] L [x] R  [] Instability [] Hyperextension [x] Pain OA  - Ankle/Foot: [x] L [] R  [] Instability [] Frequent sprains  X Pain , sublux? Rt foot pain hx     PERTINENT HISTORY:  Reviewed Ehlers-Danlos syndrome genetic panel from the [facility] lab from March 2024. Patient had 22 genes detected typically associated with the variants of Ehlers-Danlos and Marfan's etc. No reportable or pathological variants were identified. Report will be sent to scanned in lab section. Cousin has EDS, kids as well, brother?   MSK: Hypermobility evaluation: Beighton score +5/9. EDS evaluation positive score of 6  including unusually soft or velvety  skin, mild skin hyperextensibility, unexplained stretch marks, bilateral piezogenic papules, atrophic scarring, dental crowding  History of ankylosing spondylitis, hidradenitis suppurativa, and multiple sclerosis.  Relevant historical information:     PAIN:  Are you having pain? Yes: NPRS scale: 5/10- is baseline Pain location: neck bilateral post and lateral to jaw Pain description: sharp, achiness, tightness  Aggravating factors: is constant Relieving factors: medicine, pillow   Are you having pain? Yes: NPRS scale: 3/10 at rest, 6/10 with moves  Pain location: L hip (post)  Pain description: sore, twinge Aggravating factors: moving the wrong way  Relieving factors: repositioning, pillows    PRECAUTIONS: None spasticity in Rt LE   RED FLAGS: None  Some incontinence with GI issues   WEIGHT BEARING RESTRICTIONS: No  FALLS:  Has patient fallen in last 6 months? 3 x in the past week, many near falls    LIVING ENVIRONMENT: Lives with: lives with their family Lives in: House/apartment Stairs: Yes: Internal: unk steps; on right going up Has following equipment at home: Single point cane  OCCUPATION: disability   PLOF: Independent with basic ADLs, Vocation/Vocational requirements: shower chair,, Leisure: concerts, and    PATIENT GOALS: See above  NEXT MD VISIT: 03/22/24  OBJECTIVE:  Note: Objective measures were completed at Evaluation unless otherwise noted.  DIAGNOSTIC FINDINGS:  IMPRESSION: This MRI of the cervical spine with and without contrast shows the following: There is a T2 hyperintense focus posterolaterally to the left within the spinal cord adjacent to C6, unchanged in appearance compared to the 2021 MRI.  It does not enhance. Multilevel degenerative changes as detailed above that does not lead to spinal stenosis or nerve root compression.  There are endplate degenerative changes with edema and enhancement at  C6-C7. Compared to the MRI from 2021, the degenerative changes at C4-C5 have mildly progressed.  The enhancement at C6-C7 is more intense.   PATIENT SURVEYS:  NT   COGNITION: Overall cognitive status: Within functional limits for tasks assessed     SENSATION: WFL   POSTURE: rounded shoulders, forward head, and swayback   PALPATION: Hypermobility in Lumbar segments and pain  Pain Lt lateral SI border> Rt    LUMBAR ROM:   AROM eval  Flexion WNL palms flat  Extension WNL pain central   Right lateral flexion Stiff 25%   Left lateral flexion Stiff 25% Pain   Right rotation WFL  Left rotation WFL    (Blank rows = not tested)  LOWER EXTREMITY ROM:   WFL AROM  Passive  Right eval Left eval  Hip flexion Pinching ant  Pinching ant   Hip extension    Hip abduction    Hip adduction    Hip internal rotation Hyper with pain Hyper with pain   Hip external rotation hyper hyper  Knee flexion    Knee extension    Ankle dorsiflexion    Ankle plantarflexion    Ankle inversion    Ankle eversion     (Blank rows = not tested)  LOWER EXTREMITY MMT:    MMT Right eval Left eval  Hip flexion 4 4-  Hip extension 3+ 3+  Hip abduction 4 3+  Hip adduction    Hip internal rotation    Hip external rotation    Knee flexion 4+ 4+  Knee extension 5 5  Ankle dorsiflexion 5 5  Ankle plantarflexion    Ankle inversion    Ankle eversion     (Blank rows = not tested)  LUMBAR SPECIAL TESTS:  Straight leg raise test: Negative, Single leg stance test: Positive, and FABER test: Positive L SIJ  Pain in L low back with SLR    CERVICAL ROM: NT   Active ROM A/PROM (deg) 03/18/2024  Flexion   Extension   Right lateral flexion   Left lateral flexion   Right rotation   Left rotation    (Blank rows = not tested)  UE ROM:  Active ROM Right 03/18/2024 Left 03/18/2024  Shoulder flexion    Shoulder extension    Shoulder abduction    Shoulder adduction    Shoulder extension     Shoulder internal rotation    Shoulder external rotation    Elbow flexion    Elbow extension    Wrist flexion    Wrist extension    Wrist ulnar deviation    Wrist radial deviation    Wrist pronation    Wrist supination     (Blank rows = not tested)  UE MMT:  MMT Right 03/18/2024 Left 03/18/2024  Shoulder flexion    Shoulder extension    Shoulder abduction    Shoulder adduction    Shoulder extension    Shoulder internal rotation    Shoulder external rotation    Middle trapezius    Lower trapezius    Elbow flexion    Elbow extension    Wrist flexion    Wrist extension    Wrist ulnar deviation    Wrist radial deviation    Wrist pronation    Wrist supination    Grip strength     (Blank rows = not tested)  CERVICAL SPECIAL TESTS:  NT   FUNCTIONAL TESTS:  5 times sit to stand: 23 sec SLS Rt LE 10 sec, Lt. < 5 sec     Beighton Scale- NT on eval but does have +1 lumbar and + 2 knees  Lumbar (_/1) Knees (_/2) Elbows (_/2)  5th digit (_2) Thumb (_/2) Comment on hips, shoulders    GAIT: Distance walked: 100 Assistive device utilized: Single point cane Level of assistance: Complete Independence Comments: slow pace   TREATMENT DATE:   [facility] Adult PT Treatment:                                                DATE: 03/18/24 Self Care: Neutral spine, A/P tilt, core and basic stability      POC, HEP                                                                                                                    PATIENT EDUCATION:  Education details: Posture, alignment, neutral zone and joint protection PNE/Explain pain   Joint inflammation/collagen/ligament laxity, muscular support Stretching vs stabilizing  Person educated: Patient Education method: Explanation, Demonstration, and Handouts Education comprehension: verbalized understanding and returned demonstration   HOME EXERCISE PROGRAM: Access Code: [email or web address] URL:  [email or web address] Date: 03/18/2024 Prepared by: Delon Norma  Exercises - Hooklying Transversus Abdominis Palpation  - 1 x daily - 7 x weekly - 2 sets - 10 reps - 5 hold - Supine March  - 1 x daily - 7 x weekly - 2 sets - 10 reps - 5 hold - Seated Transversus Abdominis Bracing  - 1 x daily - 7 x weekly - 2 sets - 10 reps - 5 hold  ASSESSMENT:  CLINICAL IMPRESSION: Patient is a 43 y.o. female  who was seen today for physical therapy evaluation and treatment for hypermobility syndrome/hEDS with a complicated medical history. Focus today was on LE and back but does also complain of neck pain and headaches- will complete next visit.   OBJECTIVE IMPAIRMENTS: Abnormal gait, cardiopulmonary status limiting activity, decreased activity tolerance, decreased balance, decreased coordination, decreased endurance, decreased mobility, difficulty walking, decreased strength, improper body mechanics, postural dysfunction, pain, and spasticity/tone, Hypermobility .   ACTIVITY LIMITATIONS: carrying, lifting, bending, sitting, standing, squatting, sleeping, stairs, transfers, reach over head, locomotion level, and caring for others  PARTICIPATION LIMITATIONS: meal prep, cleaning, laundry, interpersonal relationship, driving, shopping, community activity, and occupation  PERSONAL FACTORS: Past/current experiences, Social background, Time since onset of injury/illness/exacerbation, and 3+ comorbidities: ankylosing spondylitis, multiple sclerosis, EDS, POTS  are also affecting patient's functional outcome.   REHAB POTENTIAL: Good  CLINICAL DECISION MAKING: Unstable/unpredictable  EVALUATION COMPLEXITY: High   GOALS: Goals reviewed with patient? Yes  SHORT TERM GOALS: Target date: 04/15/2024    Patient will be able to show independence for initial HEP to include posture, core and hip strength and stability.   Baseline: Goal status: INITIAL  2.  Patient will be able to tolerate  functional testing and set goals (Balance and endurance)  Baseline:  Goal status: INITIAL  3.  Patient will understand and perform proper hip hinge for home tasks (laundry, reaching) and other strategies to reduce pain in back, neck.  Baseline:  Goal status: INITIAL  4.  Patient will notice improved core/abdominal support with functional activities/ADLs.  Baseline:  Goal status: INITIAL  LONG TERM GOALS: Target date: 05/13/2024    Patient will be independent with final HEP upon discharge from PT and report consistent benefit following exercise completion.    Baseline:  Goal status: INITIAL  2.  Patient will be able to demonstrate proper llifting techniques related to spine health and reduction of symptoms, items < 25 lbs from the floor.  Baseline:  Goal status: INITIAL  3.  Patient will be able to stand for 30 min with no more than min increase in hip, back pain  Baseline:  Goal status: INITIAL  4.  Functional mobility goals TBA Baseline:  Goal status: INITIAL  5.  Neck goals TBA based on 2nd visit.  Baseline:  Goal status: INITIAL  PLAN:  PT FREQUENCY: 2x/week  PT DURATION: 8 weeks  PLANNED INTERVENTIONS: 97164- PT Re-evaluation, 97750- Physical Performance Testing, 97110-Therapeutic exercises, 97530- Therapeutic activity, [id number]- Neuromuscular re-education, 97535- Self Care, [id number]- Manual therapy, [phone]- Gait training, [phone]- Electrical stimulation (unattended), Patient/Family education, Balance training, Stair training, Taping, Spinal mobilization, Cryotherapy, and Moist heat.  PLAN FOR NEXT SESSION: check C- spine.  HEP for core, posture.  Hinging /body mechanics.    Jaylenn Altier, PT 03/18/2024, 3:16 PM    Delon Norma, PT 03/18/24 4:55 PM Phone: [phone] Fax: [phone]

## 2024-03-22 ENCOUNTER — Ambulatory Visit: Admitting: Family Medicine

## 2024-03-22 VITALS — BP 110/58 | HR 99 | Ht 68.0 in | Wt 198.0 lb

## 2024-03-22 DIAGNOSIS — Q7962 Hypermobile Ehlers-Danlos syndrome: Secondary | ICD-10-CM | POA: Diagnosis not present

## 2024-03-22 DIAGNOSIS — G90A Postural orthostatic tachycardia syndrome (POTS): Secondary | ICD-10-CM | POA: Diagnosis not present

## 2024-03-22 NOTE — Patient Instructions (Signed)
 Thank you for coming in today.   Work on quitting smoking  Keep a log of your blood pressure  Check back in 3 months.

## 2024-03-22 NOTE — Progress Notes (Signed)
° °  I, Claretha Schimke am a scribe for Dr. Artist Lloyd, MD.  Brittney Harrison is a 43 y.o. female who presents to [facility] at [facility] today for 63-month f/u hEDS and POTS. Pt was last seen by Dr. Lloyd on 02/22/24 and was provided handouts and referred to PT, completing 1 visit.  Today, pt reports that she is in pain today more than normal. Doing slightly better at the salt and water intake.  Pertinent review of systems: No fevers or chills  Relevant historical information: Ankylosing spondylitis.  Ehlers-Danlos.  History of MS   Exam:  BP (!) 110/58   Pulse 99   Ht 5' 8 (1.727 m)   Wt 198 lb (89.8 kg)   SpO2 96%   BMI 30.11 kg/m  General: Well Developed, well nourished, and in no acute distress.   MSK: Normal cervical motion.  Upper extremity strength is intact.    Lab and Radiology Results No results found for this or any previous visit (from the past 72 hours). No results found.     Assessment and Plan: 43 y.o. female with hypermobile EDS.  POTS is a complicating factor.  She is doing better from a POTS standpoint with fluids.  Plan to continue current regimen including exercise.  Will keep track of blood pressure at home.  Check back in 3 months.  We also talked about smoking a bit.  She will work on quitting smoking.   PDMP not reviewed this encounter. No orders of the defined types were placed in this encounter.  No orders of the defined types were placed in this encounter.    Discussed warning signs or symptoms. Please see discharge instructions. Patient expresses understanding.   The above documentation has been reviewed and is accurate and complete Artist Lloyd, M.D. Total encounter time 20 minutes including face-to-face time with the patient and, reviewing past medical record, and charting on the date of service.

## 2024-03-27 ENCOUNTER — Telehealth: Payer: Self-pay | Admitting: *Deleted

## 2024-03-27 NOTE — Telephone Encounter (Signed)
 Received a call from patient assistance foundation. Calling to verify if they will be shipping the medication to the patient's house or to the office. They are requesting a call back to [phone]

## 2024-03-27 NOTE — Telephone Encounter (Signed)
Attempted to contact pt to discuss, left VoiceMail requesting a return call. Direct office number provided.

## 2024-03-27 NOTE — Telephone Encounter (Signed)
 Patient is calling regarding renewal forms for 2026 Cosentyx , patient states all forms should be the same as 2025, patient approved 01/2024.

## 2024-03-28 NOTE — Telephone Encounter (Signed)
 Spoke with pt earlier today who stated that she did in fact request medication be sent to her house. [employer] and spoke with rep who informs me that she does indeed see the request to ship to pt's home was made yesterday and is not sure how/why there was any confusion regarding delivery destination. Additionally she could not see that there was currently any shipping information attached to the order, which meant that it was either already in route to the pt or it was simply sitting there pending final confirmation. She reached out to the pharmacy to confirm the current status.  According to pharmacy rep, they had attempted to make contact with pt on 12/13 to confirm delivery address however pt had not responded. Apparently the nonverbal confirmation provided by pt on 12/17 was not good enough to meet the requirements for shipping. Rep stated that the pharmacy would make another attempt to contact pt to confirm and leave a message if needed, however the medication would NOT be shipped until the pt was personally spoken to.

## 2024-04-09 ENCOUNTER — Encounter: Admitting: Physical Therapy

## 2024-04-12 ENCOUNTER — Ambulatory Visit: Admitting: Physical Therapy

## 2024-04-12 ENCOUNTER — Encounter: Payer: Self-pay | Admitting: Physical Therapy

## 2024-04-12 ENCOUNTER — Telehealth: Payer: Self-pay

## 2024-04-12 DIAGNOSIS — M6281 Muscle weakness (generalized): Secondary | ICD-10-CM

## 2024-04-12 DIAGNOSIS — M25552 Pain in left hip: Secondary | ICD-10-CM

## 2024-04-12 DIAGNOSIS — M542 Cervicalgia: Secondary | ICD-10-CM

## 2024-04-12 DIAGNOSIS — M357 Hypermobility syndrome: Secondary | ICD-10-CM | POA: Diagnosis not present

## 2024-04-12 NOTE — Therapy (Signed)
 " OUTPATIENT PHYSICAL THERAPY NOTE   Patient Name: Brittney Harrison MRN: [id number] DOB:[birth year], 44 y.o., female Today's Date: 04/12/2024  END OF SESSION:  PT End of Session - 04/12/24 0933     Visit Number 2    Number of Visits 16    Date for Recertification  05/13/24    Authorization Type MCR A and B    PT Start Time 0931    PT Stop Time 1015    PT Time Calculation (min) 44 min    Activity Tolerance Patient tolerated treatment well    Behavior During Therapy [facility] for tasks assessed/performed;Anxious           Past Medical History:  Diagnosis Date   Abnormal Pap smear 2007   CIN-1   ADHD    Ankylosing spondylitis (HCC)    ASCUS with positive high risk HPV 05/2005   Asthma    CIN I (cervical intraepithelial neoplasia I) 06/2005   Clitoral irritation 07/2006   Complication of anesthesia    severe vomiting   DDD (degenerative disc disease), cervical    DDD (degenerative disc disease), lumbar    Fatigue    Fibrocystic breast 2007   Fullness of breast 01/2007   right   GAD (generalized anxiety disorder)    GERD (gastroesophageal reflux disease)    H/O seasonal allergies    H/O varicella    Headache(784.0)    migraines    Hidradenitis suppurativa    Hx: UTI (urinary tract infection)    Increased BMI    Irregular bleeding 10/2005   Multiple sclerosis    Occipital headache    POTS (postural orthostatic tachycardia syndrome)    Sleep apnea    Thyroid  disease    Fluxuate between hyper to hypo   Thyromegaly 11/2005   Vision abnormalities    Yeast vaginitis 07/2006   Past Surgical History:  Procedure Laterality Date   CHOLECYSTECTOMY  09/11/91   DILATION AND CURETTAGE OF UTERUS  2011   endometritis   DILATION AND CURETTAGE OF UTERUS  11/19/2011   Procedure: DILATATION AND CURETTAGE;  Surgeon: Shanda SHAUNNA Muscat, MD;  Location: [facility];  Service: Gynecology;  Laterality: N/A;  dilitation and currettage with repair of intraoperative cervical laceration.   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   Myofascial pain 01/30/2024   Neck pain 06/06/2022   POTS (postural orthostatic tachycardia syndrome) 11/29/2021   Other headache syndrome 11/25/2019   Fever 08/14/2019   Ankylosing spondylitis (HCC) 05/22/2019   High risk medication use 05/22/2019   Pneumonia 06/16/2017   Multifocal pneumonia 06/15/2017   Left optic neuritis 01/23/2017   Multiple sclerosis 10/13/2016   Gait disturbance 10/13/2016   Other fatigue 10/13/2016   Numbness 10/13/2016   Urinary urgency 10/13/2016   Kell isoimmunization during pregnancy    AMA (advanced maternal age) multigravida 35+    Multiple sclerosis complicating pregnancy    Advanced maternal age in multigravida    Vaginal delivery 11/19/2011   PPH (postpartum hemorrhage) 11/19/2011   Hyperthyroidism 07/27/2011   Asthma 07/27/2011   GERD (gastroesophageal reflux disease) 07/27/2011   IBS (irritable bowel syndrome) 07/27/2011   Migraines 07/27/2011   Tobacco abuse 07/27/2011    PCP: Kip Righter, MD   REFERRING PROVIDER: Joane Birmingham MD   REFERRING DIAG: Joane Birmingham MD   Rationale for Evaluation and Treatment: Rehabilitation  THERAPY DIAG:  No  diagnosis found.  ONSET DATE: chronic   SUBJECTIVE:                                                                                                                                                                                           SUBJECTIVE STATEMENT: AS, Osteo Did the exercises until XMAS/   Chronic neck pain 2004.  Pain has worsened over the years.  MRI showed progression of degenerative process. She has a C7 lesion (MS).  In 2020, she sat up, heard a crack in her neck hips down went numb.  New damage to C7. AS pissed off MS in her neck.   Feels tight no matter what I do. Head feels heavy can't hold it up. Constantly leaning.  Occipital  neuralgia gets flared up too.  The muscles are too tight squeezing the nerves. For years coat hanger pain . Tight traps.   Rt shoulder unstable for a week or so.       Patient was formally diagnosed with Ehlers-Danlos after a complete genetic workup see below.  She presents today for physical therapy evaluation at the request of Dr. Joane.   She originally brought it up to her Neurologist and did the testing with [facility].  Dr. Jeannetta referred to Dr. Joane who confirmed the diagnosis.    She currently has difficulty with the following activities due to joint instability pain and fatigue:   Standing> 10 min, walking>20-30 min, uses cane, bending forward and coming back up. Diff walking up due to fatigue in mm (low back)  Headaches, pain with cervical motions.  Pain in wrist/hand with Writing.  Clumsy.   Areas most affected are her L hip, low back and Neck Spasticity in her Rt LE>  She would like to be able to stand at a concert and be able to do things without a flare or a 4 day .  I have to plan my life around my pain.   She has had physical therapy before (2021) and results were decent.   She foes a lot of research and is very knowledgeable about her conditions.    Patient reports symptoms and/or instability in the following areas:  - Cervical Spine: [x] Pain [x] Instability [] Limited ROM [] Paresthesia  - Thoracic Spine: [] Pain [] Instability  - Lumbar Spine: [x] Pain [x] Instability [] Frequent locking/giving out  - Shoulder: [] L [] R  [] Subluxation [] Dislocations [] Pain [] Fatigue  - Elbow: [] L [] R  [] Instability [] Hyperextension [] Pain  - Wrist/Hand: [] L [x] R  [] Instability [] Fatigue with use [x] Pain due to OA , pain with gripping, writing. Tight when flared with AS  - Hip: [x] L [x] R  [x] Instability [x] Pain [] Clicking [] Gait  deviations , sublux Lt hip  - Knee: [] L [x] R  [] Instability [] Hyperextension [x] Pain OA  - Ankle/Foot: [x] L [] R  [] Instability [] Frequent sprains  X  Pain , sublux? Rt foot pain hx     PERTINENT HISTORY:  Reviewed Ehlers-Danlos syndrome genetic panel from the [facility] lab from March 2024. Patient had 22 genes detected typically associated with the variants of Ehlers-Danlos and Marfan's etc. No reportable or pathological variants were identified. Report will be sent to scanned in lab section. Cousin has EDS, kids as well, brother?   MSK: Hypermobility evaluation: Beighton score +5/9. EDS evaluation positive score of 6 including unusually soft or velvety skin, mild skin hyperextensibility, unexplained stretch marks, bilateral piezogenic papules, atrophic scarring, dental crowding  History of ankylosing spondylitis, hidradenitis suppurativa, and multiple sclerosis.  Relevant historical information:     PAIN:  Are you having pain? Yes: NPRS scale: 5/10- is baseline Pain location: neck bilateral post and lateral to jaw Pain description: sharp, achiness, tightness  Aggravating factors: is constant Relieving factors: medicine, pillow   Are you having pain? Yes: NPRS scale: 5/10  Pain location: L hip (post)  and back  Pain description: sore, twinge Aggravating factors: moving the wrong way  Relieving factors: repositioning, pillows    PRECAUTIONS: None spasticity in Rt LE   RED FLAGS: None  Some incontinence with GI issues   WEIGHT BEARING RESTRICTIONS: No  FALLS:  Has patient fallen in last 6 months? 3 x in the past week, many near falls    LIVING ENVIRONMENT: Lives with: lives with their family Lives in: House/apartment Stairs: Yes: Internal: unk steps; on right going up Has following equipment at home: Single point cane  OCCUPATION: disability   PLOF: Independent with basic ADLs, Vocation/Vocational requirements: shower chair,, Leisure: concerts, and    PATIENT GOALS: See above  NEXT MD VISIT: 03/22/24  OBJECTIVE:  Note: Objective measures were completed at Evaluation unless otherwise noted.  DIAGNOSTIC  FINDINGS:  IMPRESSION: This MRI of the cervical spine with and without contrast shows the following: There is a T2 hyperintense focus posterolaterally to the left within the spinal cord adjacent to C6, unchanged in appearance compared to the 2021 MRI.  It does not enhance. Multilevel degenerative changes as detailed above that does not lead to spinal stenosis or nerve root compression.  There are endplate degenerative changes with edema and enhancement at C6-C7. Compared to the MRI from 2021, the degenerative changes at C4-C5 have mildly progressed.  The enhancement at C6-C7 is more intense.  Lumbar 2020 IMPRESSION: This MRI of the lumbar spine without contrast shows the following: 1.   There are mild degenerative changes at L3-L4, L4-L5 and L5-S1 that do not lead to nerve root compression though there is mild to moderate foraminal narrowing to the right at L4-L5. 2.   No evidence of demyelination in the conus medullaris.    PATIENT SURVEYS:  NT   COGNITION: Overall cognitive status: Within functional limits for tasks assessed     SENSATION: WFL   POSTURE: rounded shoulders, forward head, and swayback   PALPATION: Hypermobility in Lumbar segments and pain  Pain Lt lateral SI border> Rt    LUMBAR ROM:   AROM eval  Flexion WNL palms flat  Extension WNL pain central   Right lateral flexion Stiff 25%   Left lateral flexion Stiff 25% Pain   Right rotation WFL  Left rotation WFL    (Blank rows = not tested)  LOWER EXTREMITY ROM:   WFL AROM  Passive  Right eval Left eval  Hip flexion Pinching ant  Pinching ant   Hip extension    Hip abduction    Hip adduction    Hip internal rotation Hyper with pain Hyper with pain   Hip external rotation hyper hyper  Knee flexion    Knee extension    Ankle dorsiflexion    Ankle plantarflexion    Ankle inversion    Ankle eversion     (Blank rows = not tested)  LOWER EXTREMITY MMT:    MMT Right eval Left eval  Hip flexion 4 4-   Hip extension 3+ 3+  Hip abduction 4 3+  Hip adduction    Hip internal rotation    Hip external rotation    Knee flexion 4+ 4+  Knee extension 5 5  Ankle dorsiflexion 5 5  Ankle plantarflexion    Ankle inversion    Ankle eversion     (Blank rows = not tested)  LUMBAR SPECIAL TESTS:  Straight leg raise test: Negative, Single leg stance test: Positive, and FABER test: Positive L SIJ  Pain in L low back with SLR    CERVICAL ROM: NT   Active ROM A/PROM (deg) 04/12/2024  Flexion 54 pulling  Extension 65 min central  Right lateral flexion 40  Left lateral flexion 36  Right rotation WFL crispies  Left rotation WFL crispies   (Blank rows = not tested)  UE ROM: WNL  Active ROM Right 04/12/2024 Left 04/12/2024  Shoulder flexion    Shoulder extension    Shoulder abduction    Shoulder adduction    Shoulder extension    Shoulder internal rotation    Shoulder external rotation    Elbow flexion    Elbow extension    Wrist flexion    Wrist extension    Wrist ulnar deviation    Wrist radial deviation    Wrist pronation    Wrist supination     (Blank rows = not tested)  UE MMT:  MMT Right 04/12/2024 Left 04/12/2024  Shoulder flexion 4- 3+  Shoulder extension    Shoulder abduction 3+ 3+  Shoulder adduction    Shoulder extension    Shoulder internal rotation 4+ 4+  Shoulder external rotation 4+ 4  Middle trapezius    Lower trapezius    Elbow flexion 4+ 4+  Elbow extension 4 4  Wrist flexion    Wrist extension    Wrist ulnar deviation    Wrist radial deviation    Wrist pronation    Wrist supination    Grip strength 43 39   (Blank rows = not tested)  CERVICAL SPECIAL TESTS:  NT   FUNCTIONAL TESTS:  5 times sit to stand: 23 sec SLS Rt LE 10 sec, Lt. < 5 sec  Cervical DNF 5 sec    GAIT: Distance walked: 100 Assistive device utilized: Single point cane Level of assistance: Complete Independence Comments: slow pace   TREATMENT DATE:   [facility] Adult PT  Treatment:                                                DATE: 04/12/24 Manual Therapy: Cervical isometrics all motions Gentle cervical retraction  Therapeutic Activity: TrA  TrA with march (increased head pain)  Tr A with clam alternating  Chin tuck Chin tuck with lift off  Standing  green Thera-Band shoulder elevation depression Seated plus pulls green band Self Care: Cervical proprioception, spinal stability and sense of upper trap tension, stabilization concepts throughout  [facility] Adult PT Treatment:                                                DATE: 03/18/24 Self Care: Neutral spine, A/P tilt, core and basic stability      POC, HEP                                                                                                                    PATIENT EDUCATION:  Education details: Posture, alignment, neutral zone and joint protection PNE/Explain pain   Joint inflammation/collagen/ligament laxity, muscular support Stretching vs stabilizing  Person educated: Patient Education method: Explanation, Demonstration, and Handouts Education comprehension: verbalized understanding and returned demonstration   HOME EXERCISE PROGRAM: Access Code: [email or web address] URL: [email or web address] Date: 03/18/2024 Prepared by: Delon Norma Access Code: [email or web address] URL: [email or web address] Date: 04/12/2024 Prepared by: Delon Norma  Exercises - Hooklying Transversus Abdominis Palpation  - 1 x daily - 7 x weekly - 2 sets - 10 reps - 5 hold - Supine March  - 1 x daily - 7 x weekly - 2 sets - 10 reps - 5 hold - Seated Transversus Abdominis Bracing  - 1 x daily - 7 x weekly - 2 sets - 10 reps - 5 hold - Standing Isometric Cervical Sidebending with Manual Resistance  - 1 x daily - 7 x weekly - 2 sets - 5-10 reps - 5 hold - Standing Isometric Cervical Rotation  - 1 x daily - 7 x weekly - 2 sets - 5-10 reps - 5 hold - Seated Isometric Cervical Flexion  - 1 x daily - 7 x  weekly - 2 sets - 5-10 reps - 5 hold - Supine Cervical Retraction with Towel  - 1 x daily - 7 x weekly - 2 sets - 10 reps - 5 hold - Standing Shoulder Shrugs with Dumbbells  - 1 x daily - 7 x weekly - 2 sets - 10 reps - 5 hold - Standing Shoulder Row with Anchored Resistance  - 1 x daily - 7 x weekly - 2 sets - 10 reps - 5 hold  ASSESSMENT:  CLINICAL IMPRESSION: Patient was seen for second treatment but first look at her upper body.  Although she senses a great deal of tightness in her upper traps neck and shoulders, she is feeling this because of the lack of stability in her cervical spine and that was clear with today's evaluation.  She was given isometrics for her cervical spine as well as banded exercises to improve connection to her scapula.  Patient needs cues for technique to keep head in a neutral position as well as grade the muscle contraction to ensure it is very light.  Recommended she use a  mirror to be sure her head does not move when she contracts.  Continue physical therapy as tolerated for connective tissue disorder.   Patient is a 44 y.o. female  who was seen today for physical therapy evaluation and treatment for hypermobility syndrome/hEDS with a complicated medical history. Focus today was on LE and back but does also complain of neck pain and headaches- will complete next visit.   OBJECTIVE IMPAIRMENTS: Abnormal gait, cardiopulmonary status limiting activity, decreased activity tolerance, decreased balance, decreased coordination, decreased endurance, decreased mobility, difficulty walking, decreased strength, improper body mechanics, postural dysfunction, pain, and spasticity/tone, Hypermobility .   ACTIVITY LIMITATIONS: carrying, lifting, bending, sitting, standing, squatting, sleeping, stairs, transfers, reach over head, locomotion level, and caring for others  PARTICIPATION LIMITATIONS: meal prep, cleaning, laundry, interpersonal relationship, driving, shopping, community  activity, and occupation  PERSONAL FACTORS: Past/current experiences, Social background, Time since onset of injury/illness/exacerbation, and 3+ comorbidities: ankylosing spondylitis, multiple sclerosis, EDS, POTS  are also affecting patient's functional outcome.   REHAB POTENTIAL: Good  CLINICAL DECISION MAKING: Unstable/unpredictable  EVALUATION COMPLEXITY: High   GOALS: Goals reviewed with patient? Yes  SHORT TERM GOALS: Target date: 04/15/2024    Patient will be able to show independence for initial HEP to include posture, core and hip strength and stability.   Baseline: Goal status: Ongoing 2.  Patient will be able to tolerate functional testing and set goals (Balance and endurance)  Baseline:  Goal status: Ongoing 3.  Patient will understand and perform proper hip hinge for home tasks (laundry, reaching) and other strategies to reduce pain in back, neck.  Baseline:  Goal status: Ongoing 4.  Patient will notice improved core/abdominal support with functional activities/ADLs.  Baseline:  Goal status: Ongoing LONG TERM GOALS: Target date: 05/13/2024    Patient will be independent with final HEP upon discharge from PT and report consistent benefit following exercise completion.    Baseline:  Goal status: INITIAL  2.  Patient will be able to demonstrate proper llifting techniques related to spine health and reduction of symptoms, items < 25 lbs from the floor.  Baseline:  Goal status: INITIAL  3.  Patient will be able to stand for 30 min with no more than min increase in hip, back pain  Baseline:  Goal status: INITIAL  4.  Functional mobility goals TBA Baseline:  Goal status: INITIAL  5.  Neck goals TBA based on 2nd visit.  Baseline:  Goal status: INITIAL  PLAN:  PT FREQUENCY: 2x/week  PT DURATION: 8 weeks  PLANNED INTERVENTIONS: 97164- PT Re-evaluation, 97750- Physical Performance Testing, 97110-Therapeutic exercises, 97530- Therapeutic activity, [id number]-  Neuromuscular re-education, 97535- Self Care, [id number]- Manual therapy, [phone]- Gait training, [phone]- Electrical stimulation (unattended), Patient/Family education, Balance training, Stair training, Taping, Spinal mobilization, Cryotherapy, and Moist heat.  PLAN FOR NEXT SESSION: check C- spine.  HEP for core, posture.  Hinging /body mechanics.    Raia Amico, PT 04/12/2024, 9:34 AM    Delon Norma, PT 04/12/2024 9:34 AM Phone: [phone] Fax: [phone]  "

## 2024-04-12 NOTE — Telephone Encounter (Signed)
 PAP forms have been drafted, pt's signature has been obtained. Completed forms have been sent to [facility] for retention and will be submitted once provider form has been signed and received in [place] as well.

## 2024-04-16 ENCOUNTER — Ambulatory Visit (INDEPENDENT_AMBULATORY_CARE_PROVIDER_SITE_OTHER): Admitting: Physical Therapy

## 2024-04-16 ENCOUNTER — Encounter: Payer: Self-pay | Admitting: Physical Therapy

## 2024-04-16 DIAGNOSIS — M357 Hypermobility syndrome: Secondary | ICD-10-CM | POA: Diagnosis not present

## 2024-04-16 DIAGNOSIS — M25552 Pain in left hip: Secondary | ICD-10-CM | POA: Diagnosis not present

## 2024-04-16 DIAGNOSIS — M542 Cervicalgia: Secondary | ICD-10-CM | POA: Diagnosis not present

## 2024-04-16 DIAGNOSIS — M6281 Muscle weakness (generalized): Secondary | ICD-10-CM | POA: Diagnosis not present

## 2024-04-16 NOTE — Therapy (Signed)
 " OUTPATIENT PHYSICAL THERAPY NOTE   Patient Name: Brittney Harrison MRN: [id number] DOB:Aug 18, 1980, 44 y.o., female Today's Date: 04/16/2024  END OF SESSION:  PT End of Session - 04/16/24 0936     Visit Number 3    Number of Visits 16    Date for Recertification  05/13/24    Authorization Type MCR A and B    PT Start Time 0934    PT Stop Time 1015    PT Time Calculation (min) 41 min    Activity Tolerance Patient tolerated treatment well    Behavior During Therapy [facility] for tasks assessed/performed;Anxious           Past Medical History:  Diagnosis Date   Abnormal Pap smear 2007   CIN-1   ADHD    Ankylosing spondylitis (HCC)    ASCUS with positive high risk HPV 05/2005   Asthma    CIN I (cervical intraepithelial neoplasia I) 06/2005   Clitoral irritation 07/2006   Complication of anesthesia    severe vomiting   DDD (degenerative disc disease), cervical    DDD (degenerative disc disease), lumbar    Fatigue    Fibrocystic breast 2007   Fullness of breast 01/2007   right   GAD (generalized anxiety disorder)    GERD (gastroesophageal reflux disease)    H/O seasonal allergies    H/O varicella    Headache(784.0)    migraines    Hidradenitis suppurativa    Hx: UTI (urinary tract infection)    Increased BMI    Irregular bleeding 10/2005   Multiple sclerosis    Occipital headache    POTS (postural orthostatic tachycardia syndrome)    Sleep apnea    Thyroid  disease    Fluxuate between hyper to hypo   Thyromegaly 11/2005   Vision abnormalities    Yeast vaginitis 07/2006   Past Surgical History:  Procedure Laterality Date   CHOLECYSTECTOMY  09/11/91   DILATION AND CURETTAGE OF UTERUS  2011   endometritis   DILATION AND CURETTAGE OF UTERUS  11/19/2011   Procedure: DILATATION AND CURETTAGE;  Surgeon: Shanda SHAUNNA Muscat, MD;  Location: [facility];  Service: Gynecology;  Laterality: N/A;  dilitation and currettage with repair of intraoperative cervical laceration.   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   Myofascial pain 01/30/2024   Neck pain 06/06/2022   POTS (postural orthostatic tachycardia syndrome) 11/29/2021   Other headache syndrome 11/25/2019   Fever 08/14/2019   Ankylosing spondylitis (HCC) 05/22/2019   High risk medication use 05/22/2019   Pneumonia 06/16/2017   Multifocal pneumonia 06/15/2017   Left optic neuritis 01/23/2017   Multiple sclerosis 10/13/2016   Gait disturbance 10/13/2016   Other fatigue 10/13/2016   Numbness 10/13/2016   Urinary urgency 10/13/2016   Kell isoimmunization during pregnancy    AMA (advanced maternal age) multigravida 35+    Multiple sclerosis complicating pregnancy    Advanced maternal age in multigravida    Vaginal delivery 11/19/2011   PPH (postpartum hemorrhage) 11/19/2011   Hyperthyroidism 07/27/2011   Asthma 07/27/2011   GERD (gastroesophageal reflux disease) 07/27/2011   IBS (irritable bowel syndrome) 07/27/2011   Migraines 07/27/2011   Tobacco abuse 07/27/2011    PCP: Kip Righter, MD   REFERRING PROVIDER: Joane Birmingham MD   REFERRING DIAG: Joane Birmingham MD   Rationale for Evaluation and Treatment: Rehabilitation  THERAPY DIAG:  Hypermobility  syndrome  Muscle weakness (generalized)  Pain in left hip  Cervicalgia  ONSET DATE: chronic   SUBJECTIVE:                                                                                                                                                                                           SUBJECTIVE STATEMENT: Pain is moderate right now in her neck, low back, hips.  Brought her cane today.  Headache today.    Patient was formally diagnosed with Ehlers-Danlos after a complete genetic workup see below.  She presents today for physical therapy evaluation at the request of Dr. Joane.   She originally brought it up to her Neurologist and  did the testing with [facility].  Dr. Jeannetta referred to Dr. Joane who confirmed the diagnosis.    She currently has difficulty with the following activities due to joint instability pain and fatigue:   Standing> 10 min, walking>20-30 min, uses cane, bending forward and coming back up. Diff walking up due to fatigue in mm (low back)  Headaches, pain with cervical motions.  Pain in wrist/hand with Writing.  Clumsy.   Areas most affected are her L hip, low back and Neck Spasticity in her Rt LE>  She would like to be able to stand at a concert and be able to do things without a flare or a 4 day .  I have to plan my life around my pain.   She has had physical therapy before (2021) and results were decent.   She foes a lot of research and is very knowledgeable about her conditions.    Patient reports symptoms and/or instability in the following areas:  - Cervical Spine: [x] Pain [x] Instability [] Limited ROM [] Paresthesia  - Thoracic Spine: [] Pain [] Instability  - Lumbar Spine: [x] Pain [x] Instability [] Frequent locking/giving out  - Shoulder: [] L [] R  [] Subluxation [] Dislocations [] Pain [] Fatigue  - Elbow: [] L [] R  [] Instability [] Hyperextension [] Pain  - Wrist/Hand: [] L [x] R  [] Instability [] Fatigue with use [x] Pain due to OA , pain with gripping, writing. Tight when flared with AS  - Hip: [x] L [x] R  [x] Instability [x] Pain [] Clicking [] Gait deviations , sublux Lt hip  - Knee: [] L [x] R  [] Instability [] Hyperextension [x] Pain OA  - Ankle/Foot: [x] L [] R  [] Instability [] Frequent sprains  X Pain , sublux? Rt foot pain hx     PERTINENT HISTORY:  Reviewed Ehlers-Danlos syndrome genetic panel from the [facility] lab from March 2024. Patient had 22 genes detected typically associated with the variants of Ehlers-Danlos and Marfan's etc. No reportable or pathological variants were identified. Report will be sent to scanned in lab section. Cousin has EDS,  kids as well, brother?   MSK:  Hypermobility evaluation: Beighton score +5/9. EDS evaluation positive score of 6 including unusually soft or velvety skin, mild skin hyperextensibility, unexplained stretch marks, bilateral piezogenic papules, atrophic scarring, dental crowding  History of ankylosing spondylitis, hidradenitis suppurativa, and multiple sclerosis.  Relevant historical information:     PAIN:  Are you having pain? Yes: NPRS scale: 5/10- is baseline Pain location: neck bilateral post and lateral to jaw Pain description: sharp, achiness, tightness  Aggravating factors: is constant Relieving factors: medicine, pillow   Are you having pain? Yes: NPRS scale: 5/10  Pain location: L hip (post)  and back  Pain description: sore, twinge Aggravating factors: moving the wrong way  Relieving factors: repositioning, pillows    PRECAUTIONS: None spasticity in Rt LE   RED FLAGS: None  Some incontinence with GI issues   WEIGHT BEARING RESTRICTIONS: No  FALLS:  Has patient fallen in last 6 months? 3 x in the past week, many near falls    LIVING ENVIRONMENT: Lives with: lives with their family Lives in: House/apartment Stairs: Yes: Internal: unk steps; on right going up Has following equipment at home: Single point cane  OCCUPATION: disability   PLOF: Independent with basic ADLs, Vocation/Vocational requirements: shower chair,, Leisure: concerts, and    PATIENT GOALS: See above  NEXT MD VISIT: 03/22/24  OBJECTIVE:  Note: Objective measures were completed at Evaluation unless otherwise noted.  DIAGNOSTIC FINDINGS:  IMPRESSION: This MRI of the cervical spine with and without contrast shows the following: There is a T2 hyperintense focus posterolaterally to the left within the spinal cord adjacent to C6, unchanged in appearance compared to the 2021 MRI.  It does not enhance. Multilevel degenerative changes as detailed above that does not lead to spinal stenosis or nerve root compression.  There are  endplate degenerative changes with edema and enhancement at C6-C7. Compared to the MRI from 2021, the degenerative changes at C4-C5 have mildly progressed.  The enhancement at C6-C7 is more intense.  Lumbar 2020 IMPRESSION: This MRI of the lumbar spine without contrast shows the following: 1.   There are mild degenerative changes at L3-L4, L4-L5 and L5-S1 that do not lead to nerve root compression though there is mild to moderate foraminal narrowing to the right at L4-L5. 2.   No evidence of demyelination in the conus medullaris.    PATIENT SURVEYS:  NT   COGNITION: Overall cognitive status: Within functional limits for tasks assessed     SENSATION: WFL   POSTURE: rounded shoulders, forward head, and swayback   PALPATION: Hypermobility in Lumbar segments and pain  Pain Lt lateral SI border> Rt    LUMBAR ROM:   AROM eval  Flexion WNL palms flat  Extension WNL pain central   Right lateral flexion Stiff 25%   Left lateral flexion Stiff 25% Pain   Right rotation WFL  Left rotation WFL    (Blank rows = not tested)  LOWER EXTREMITY ROM:   WFL AROM  Passive  Right eval Left eval  Hip flexion Pinching ant  Pinching ant   Hip extension    Hip abduction    Hip adduction    Hip internal rotation Hyper with pain Hyper with pain   Hip external rotation hyper hyper  Knee flexion    Knee extension    Ankle dorsiflexion    Ankle plantarflexion    Ankle inversion    Ankle eversion     (Blank rows = not tested)  LOWER  EXTREMITY MMT:    MMT Right eval Left eval  Hip flexion 4 4-  Hip extension 3+ 3+  Hip abduction 4 3+  Hip adduction    Hip internal rotation    Hip external rotation    Knee flexion 4+ 4+  Knee extension 5 5  Ankle dorsiflexion 5 5  Ankle plantarflexion    Ankle inversion    Ankle eversion     (Blank rows = not tested)  LUMBAR SPECIAL TESTS:  Straight leg raise test: Negative, Single leg stance test: Positive, and FABER test: Positive L SIJ   Pain in L low back with SLR    CERVICAL ROM: NT   Active ROM A/PROM (deg) 04/16/2024  Flexion 54 pulling  Extension 65 min central  Right lateral flexion 40  Left lateral flexion 36  Right rotation WFL crispies  Left rotation WFL crispies   (Blank rows = not tested)  UE ROM: WNL  Active ROM Right 04/16/2024 Left 04/16/2024  Shoulder flexion    Shoulder extension    Shoulder abduction    Shoulder adduction    Shoulder extension    Shoulder internal rotation    Shoulder external rotation    Elbow flexion    Elbow extension    Wrist flexion    Wrist extension    Wrist ulnar deviation    Wrist radial deviation    Wrist pronation    Wrist supination     (Blank rows = not tested)  UE MMT:  MMT Right 04/16/2024 Left 04/16/2024  Shoulder flexion 4- 3+  Shoulder extension    Shoulder abduction 3+ 3+  Shoulder adduction    Shoulder extension    Shoulder internal rotation 4+ 4+  Shoulder external rotation 4+ 4  Middle trapezius    Lower trapezius    Elbow flexion 4+ 4+  Elbow extension 4 4  Wrist flexion    Wrist extension    Wrist ulnar deviation    Wrist radial deviation    Wrist pronation    Wrist supination    Grip strength 43 39   (Blank rows = not tested)  CERVICAL SPECIAL TESTS:  NT   FUNCTIONAL TESTS:  5 times sit to stand: 23 sec SLS Rt LE 10 sec, Lt. < 5 sec  Cervical DNF 5 sec    GAIT: Distance walked: 100 Assistive device utilized: Single point cane Level of assistance: Complete Independence Comments: slow pace    BERG BALANCE  Sitting to Standing: Numbers; 0-4: 4  4. Stands without using hands and stabilize independently  3. Stands independently using hands  2. Stands using hands after multiple trials  1. Min A to stand  0. Mod-Max A to stand Standing unsupported: Numbers; 0-4: 4  4. Stands safely for 2 minutes  3. Stands 2 minutes with supervision  2. Stands 30 seconds unsupported  1. Needs several tries to stand unsupported for 30  seconds  0. Unable to stand unsupported for 30 seconds Sitting unsupported: Numbers; 0-4: 4  4. Sits for 2 minutes independently  3. Sits for 2 minutes with supervision  2. Able to sit 30 seconds  1. Able to sit 10 seconds  0. Unable to sit for 10 seconds Standing to Sitting: Numbers; 0-4: 4 4. Sits safely with minimal use of hands 3. Controls descent with hands 2. Uses back of legs against chair to control descent 1. Sits independently, but uncontrolled descent 0. Needs assistance Transfers: Numbers; 0-4: 4- increased time   4.  Transfers safely with minor use of hands  3. Transfers safely definite use of hands  2. Transfers with verbal cueing/supervision  1. Needs 1 person assist  0. Needs 2 person assist  Standing with eyes closed: Numbers; 0-4: 4  4. Stands safely for 10 seconds  3. Stands 10 seconds with supervision   2. Able to stand for 3 seconds  1. Unable to keep eyes closed for 3 seconds, but is safe  0. Needs assist to keep from falling Standing with feet together: Numbers; 0-4: 4 4. Stands for 1 minute safely 3. Stands for 1 minute with supervision 2. Unable to hold for 30 seconds  1. Needs help to attain position but can hold for 15 seconds  0. Needs help to attain position and unable to hold for 15 seconds Reaching forward with outstretched arm: Numbers; 0-4: 4  4. Reaches forward 10 inches  3. Reaches forward 5 inches  2. Reaches forward 2 inches  1. Reaches forward with supervision  0. Loses balance/requires assistace Retrieving object from the floor: Numbers; 0-4: 4 4. Able to pick up easily and safely 3. Able to pick up with supervision 2. Unable to pick up, but reaches within 1-2 inches independently 1. Unable to pick up and needs supervision 0. Unable/needs assistance to keep from falling  Turning to look behind: Numbers; 0-4: 4  4. Looks behind from both sides and weight shifts well  3. Looks behind one side only, other side less weight shift  2.  Turns sideways only, maintains balance  1. Needs supervision when turning  0. Needs assistance  Turning 360 degrees: Numbers; 0-4: 4  4. Able to turn in </=4 seconds  3. Able to turn on one side in </= 4 seconds   2. Able to turn slowly, but safely  1. Needs supervision or verbal cueing  0. Needs assistance Place alternate foot on stool: Numbers; 0-4: 3 4. Completes 8 steps in 20 seconds 3. Completes 8 steps in >20 seconds 2. 4 steps without assistance/supervision 1. Completes >2 steps with minimal assist 0. Unable, needs assist to keep from falling Standing with one foot in front: Numbers; 0-4: 3, difficulty with Rt LE back   4. Independent tandem for 30 seconds  3. Independent foot ahead for 30 seconds  2. Independent small step for 30 seconds  1. Needs help to step, but can hold for 15 seconds  0. Loses balance while standing/stepping Standing on one foot: Numbers; 0-4: 3, LLE 10 sec , Rt LE unable  4. Holds >10 seconds 3. Holds 5-10 seconds 2. Holds >/=3 seconds  1. Holds <3 seconds 0. Unable   Total Score: 53/56 patient uses a cane due to right leg muscle spasms and when she is uncertain of the location.  She does not use a device in her home  TREATMENT DATE:    [facility] Adult PT Treatment:                                                DATE: 04/16/24 Therapeutic Exercise: Recumbent bike L1 for 5 min  Hip flexor  Row blue band x 15  Extension blue band x 10  Horizontal abduction red band x 15 Looped red band ER x 15 Looped added chest press x 15 red OH lift red x 10   Therapeutic Activity: Standing posture cues  and exercises  Berg Balance Test      [facility] Adult PT Treatment:                                                DATE: 04/12/24 Manual Therapy: Cervical isometrics all motions Gentle cervical retraction  Therapeutic Activity: TrA  TrA with march (increased head pain)  Tr A with clam alternating  Chin tuck Chin tuck with lift off  Standing green Thera-Band  shoulder elevation depression Seated plus pulls green band Self Care: Cervical proprioception, spinal stability and sense of upper trap tension, stabilization concepts throughout  [facility] Adult PT Treatment:                                                DATE: 03/18/24 Self Care: Neutral spine, A/P tilt, core and basic stability      POC, HEP                                                                                                                    PATIENT EDUCATION:  Education details: Posture, alignment, neutral zone and joint protection PNE/Explain pain   Joint inflammation/collagen/ligament laxity, muscular support Stretching vs stabilizing  Person educated: Patient Education method: Explanation, Demonstration, and Handouts Education comprehension: verbalized understanding and returned demonstration   HOME EXERCISE PROGRAM: Access Code: [email or web address] URL: [email or web address] Date: 03/18/2024 Prepared by: Delon Norma Access Code: [email or web address] URL: [email or web address] Date: 04/12/2024 Prepared by: Delon Norma  Exercises - Hooklying Transversus Abdominis Palpation  - 1 x daily - 7 x weekly - 2 sets - 10 reps - 5 hold - Supine March  - 1 x daily - 7 x weekly - 2 sets - 10 reps - 5 hold - Seated Transversus Abdominis Bracing  - 1 x daily - 7 x weekly - 2 sets - 10 reps - 5 hold - Standing Isometric Cervical Sidebending with Manual Resistance  - 1 x daily - 7 x weekly - 2 sets - 5-10 reps - 5 hold - Standing Isometric Cervical Rotation  - 1 x daily - 7 x weekly - 2 sets - 5-10 reps - 5 hold - Seated Isometric Cervical Flexion  - 1 x daily - 7 x weekly - 2 sets - 5-10 reps - 5 hold - Supine Cervical Retraction with Towel  - 1 x daily - 7 x weekly - 2 sets - 10 reps - 5 hold - Standing Shoulder Shrugs with Dumbbells  - 1 x daily - 7 x weekly - 2 sets - 10 reps - 5 hold - Standing Shoulder Row with Anchored Resistance  - 1 x daily - 7 x weekly - 2 sets  - 10 reps - 5 hold  ASSESSMENT:  CLINICAL IMPRESSION:  Overall patient overall tolerated a good amount of exercise today including endurance standing posture and supine shoulder exercises.  She completed the Berg balance test which was quite good but she did have some difficulty with her right leg for certain exercises on the test.  She does use a cane consistently when she is uncertain of the environment and given her MS I think that is a good idea.  She continues to have leg fatigue and it limitations in standing tolerance.  She needs cues to improve postural support through her core and lower body she will continue to benefit from skilled physical therapy in order to meet her goals.  Patient is a 44 y.o. female  who was seen today for physical therapy evaluation and treatment for hypermobility syndrome/hEDS with a complicated medical history. Focus today was on LE and back but does also complain of neck pain and headaches- will complete next visit.   OBJECTIVE IMPAIRMENTS: Abnormal gait, cardiopulmonary status limiting activity, decreased activity tolerance, decreased balance, decreased coordination, decreased endurance, decreased mobility, difficulty walking, decreased strength, improper body mechanics, postural dysfunction, pain, and spasticity/tone, Hypermobility .   ACTIVITY LIMITATIONS: carrying, lifting, bending, sitting, standing, squatting, sleeping, stairs, transfers, reach over head, locomotion level, and caring for others  PARTICIPATION LIMITATIONS: meal prep, cleaning, laundry, interpersonal relationship, driving, shopping, community activity, and occupation  PERSONAL FACTORS: Past/current experiences, Social background, Time since onset of injury/illness/exacerbation, and 3+ comorbidities: ankylosing spondylitis, multiple sclerosis, EDS, POTS  are also affecting patient's functional outcome.   REHAB POTENTIAL: Good  CLINICAL DECISION MAKING: Unstable/unpredictable  EVALUATION  COMPLEXITY: High   GOALS: Goals reviewed with patient? Yes  SHORT TERM GOALS: Target date: 04/15/2024  Patient will be able to show independence for initial HEP to include posture, core and hip strength and stability.   Baseline: Goal status: Ongoing  2.  Patient will be able to tolerate functional testing and set goals (Balance and endurance)  Baseline:  Goal status: Ongoing  3.  Patient will understand and perform proper hip hinge for home tasks (laundry, reaching) and other strategies to reduce pain in back, neck.  Baseline:  Goal status: Ongoing  4.  Patient will notice improved core/abdominal support with functional activities/ADLs.  Baseline:  Goal status: Ongoing  LONG TERM GOALS: Target date: 05/13/2024    Patient will be independent with final HEP upon discharge from PT and report consistent benefit following exercise completion.    Baseline:  Goal status: INITIAL  2.  Patient will be able to demonstrate proper llifting techniques related to spine health and reduction of symptoms, items < 25 lbs from the floor.  Baseline:  Goal status: INITIAL  3.  Patient will be able to stand for 30 min with no more than min increase in hip, back pain  Baseline:  Goal status: INITIAL  4.  Patient will be able to stand in tandem with either leg forward for 30 sec  Baseline: diff with Rt leg back Goal status: INITIAL  5.  Patient will be able to perform 30 seconds sit to stand more than 8 repetitions Baseline:  Goal status: INITIAL  PLAN:  PT FREQUENCY: 2x/week  PT DURATION: 8 weeks  PLANNED INTERVENTIONS: [id number]- PT Re-evaluation, 97750- Physical Performance Testing, 97110-Therapeutic exercises, 97530- Therapeutic activity, [id number]- Neuromuscular re-education, 97535- Self Care, [id number]- Manual therapy, [id number]- Gait training, [phone]- Electrical stimulation (unattended), Patient/Family education, Balance training, Stair training, Taping, Spinal mobilization, Cryotherapy, and Moist  heat.   PLAN FOR NEXT SESSION:  HEP for core,  posture.  Hinging /body mechanics.  Standing postureCore stability hip strength and   Kyair Ditommaso, PT 04/16/2024, 11:21 AM    Delon Norma, PT 04/16/2024 11:21 AM Phone: [phone] Fax: [phone]  "

## 2024-04-17 NOTE — Progress Notes (Signed)
 "  Office Visit Note  Patient: Brittney Harrison             Date of Birth: 1980/09/21           MRN: [id number]             PCP: Kip Righter, MD Referring: Kip Righter, MD Visit Date: 05/01/2024   Subjective:   Discussed the use of AI scribe software for clinical note transcription with the patient, who gave verbal consent to proceed.  History of Present Illness   Brittney Harrison is a 44 year old female with Ehlers-Danlos Syndrome and Hidradenitis Suppurativa who presents for follow-up on Cosentyx  treatment after recently medication start.  She is currently undergoing treatment with Cosentyx , having completed her loading doses and awaiting maintenance dosing. No significant benefit has been noted yet from the treatment, and she finds the new injection pen more painful than previous ones. Previously, she had a good response to Cosentyx , reducing her HS boils from four to five every two weeks to one a month. She is experiencing flares of HS, primarily in the groin area. No axillary lesions have been noticed recently.  She has a history of Ehlers-Danlos Syndrome and is undergoing physical therapy twice a week, which has left her sore as she determines her baseline for activity. She experienced a subluxation of her left hip two months ago, which she managed to self-correct. Her left hip remains in constant pain, and she reports that her last ankylosing spondylitis flare was in her hips.  She had COVID-19 around Thanksgiving, which was severe for about a week. She was unable to take Paxlovid due to cost and was not put on steroids due to recent cessation of steroid treatment.  She reports swollen lymph nodes, particularly in the neck, causing mucus retention and coughing. She experiences earaches and has noted redness and swelling in her thumb, which she attributes to EDS. No axillary swelling is reported, but there is tenderness in the neck area.  She is working on smoking cessation with plans to quit  by her birthday. She is also concerned about her family history of heart disease, as her father and other relatives had significant cardiac issues before age 62.       Previous HPI 01/30/2024 Brittney Harrison is a 44 year old female with multiple sclerosis, ankylosing spondylitis, and hidradenitis suppurativa who presents with a flare-up of symptoms and medication management issues.   She reports redness and tightness in her knuckles, which she states typically precede a major flare of her ankylosing spondylitis. She recently had a sinus and eye infection, which was treated with antibiotics and steroid eye drops. She has not started Cosentyx  due to prescription plan issues.   Her multiple sclerosis symptoms are constant, with leg spasms worsened by heat and fever. She is taking baclofen  for these spasms, but it has not alleviated the constant achiness in her legs.   She reports her hidradenitis suppurativa is flaring, with constant lesions, particularly in the groin area. She was on antibiotics for an infected flare about two weeks ago. The last systemic steroid use was in March for a flare in her hip.     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   She has experienced a persistent low-grade fever for the past two years. Initially intermittent, lasting from one to eight months, it has been continuous since  September 2021. She associates the onset of the fever with a reaction to an infusion treatment for multiple sclerosis.   She describes a deep, persistent aching in her legs, likened to 'body aches with a fever, but ramped up.' This pain is constant, exacerbated by physical activity, and relieved when her legs are fully supported. The leg pain began coinciding with the constant fever.   She has a history of axial spondyloarthritis, diagnosed in 2018, affecting her lower  back, neck, and hips. She has experienced four flare-ups in her hips, with the left side more affected, although it is bilateral. She was on Cosentyx , which was effective for her hidradenitis suppurativa and axial spondyloarthritis, but is currently not on it due to insurance issues.   She has multiple sclerosis, diagnosed in 2014, with symptoms including spasticity and leg weakness. She reports a spinal lesion and exacerbations of symptoms since 2020, including a significant episode of paresthesia from her hips down after a neck movement. Follows with Dr. Sater for this, not on current maintenance although was though cosentyx  may have been providing benefit as well.   She reports symptoms of postural orthostatic tachycardia syndrome since 2015, with dizziness upon standing and occasional syncope. She experiences blood pooling in her legs and heart rate changes. Raynaud's phenomenon affects her feet, buttocks, thighs, and heels, triggered by temperatures below 65 degrees.   She has hidradenitis suppurativa, primarily affecting her groin, with less severe involvement of her armpits and under her breasts. She notes a recent loss of pubic hair over the last two months.   She experiences night sweats, pruritus of the feet and armpits, and lymphadenopathy, which she associates with possible B cell symptoms. She is currently under evaluation by a hematologist for these symptoms.   She has early onset osteoarthritis, starting in her spine at age 67, with joint pain and stiffness, particularly in her knees and wrists. She also reports frequent subluxations of various joints, including her jaw and hips.   She has a family history of early menopause, with her mother and aunts experiencing menopause by age 65. She was told she was starting perimenopause at age 63, and she experiences regular menstrual periods, although her last period was three weeks late.         12/2018 HBV/HCV neg HIV neg   Review of  Systems  Constitutional:  Positive for fatigue.  HENT:  Negative for mouth sores and mouth dryness.   Eyes:  Positive for dryness.  Respiratory:  Positive for shortness of breath.   Cardiovascular:  Positive for palpitations. Negative for chest pain.  Gastrointestinal:  Positive for diarrhea. Negative for blood in stool and constipation.  Endocrine: Negative for increased urination.  Genitourinary:  Positive for involuntary urination.  Musculoskeletal:  Positive for joint pain, gait problem, joint pain, myalgias, muscle weakness, morning stiffness, muscle tenderness and myalgias. Negative for joint swelling.  Skin:  Positive for color change, rash and sensitivity to sunlight. Negative for hair loss.  Allergic/Immunologic: Positive for susceptible to infections.  Neurological:  Positive for dizziness and headaches.  Hematological:  Negative for swollen glands.  Psychiatric/Behavioral:  Positive for sleep disturbance. Negative for depressed mood. The patient is nervous/anxious.     PMFS History:  Patient Active Problem List   Diagnosis Date Noted   Hypermobile Ehlers-Danlos syndrome 02/22/2024   Myofascial pain 01/30/2024   Neck pain 06/06/2022   POTS (postural orthostatic tachycardia syndrome) 11/29/2021   Other headache syndrome 11/25/2019   Fever 08/14/2019   Ankylosing  spondylitis (HCC) 05/22/2019   High risk medication use 05/22/2019   Pneumonia 06/16/2017   Multifocal pneumonia 06/15/2017   Left optic neuritis 01/23/2017   Multiple sclerosis 10/13/2016   Gait disturbance 10/13/2016   Other fatigue 10/13/2016   Numbness 10/13/2016   Urinary urgency 10/13/2016   Kell isoimmunization during pregnancy    AMA (advanced maternal age) multigravida 35+    Multiple sclerosis complicating pregnancy    Advanced maternal age in multigravida    Vaginal delivery 11/19/2011   PPH (postpartum hemorrhage) 11/19/2011   Hyperthyroidism 07/27/2011   Asthma 07/27/2011   GERD  (gastroesophageal reflux disease) 07/27/2011   IBS (irritable bowel syndrome) 07/27/2011   Migraines 07/27/2011   Tobacco abuse 07/27/2011    Past Medical History:  Diagnosis Date   Abnormal Pap smear 2007   CIN-1   ADHD    Ankylosing spondylitis (HCC)    ASCUS with positive high risk HPV 05/2005   Asthma    CIN I (cervical intraepithelial neoplasia I) 06/2005   Clitoral irritation 07/2006   Complication of anesthesia    severe vomiting   DDD (degenerative disc disease), cervical    DDD (degenerative disc disease), lumbar    Fatigue    Fibrocystic breast 2007   Fullness of breast 01/2007   right   GAD (generalized anxiety disorder)    GERD (gastroesophageal reflux disease)    H/O seasonal allergies    H/O varicella    Headache(784.0)    migraines    Hidradenitis suppurativa    Hx: UTI (urinary tract infection)    Increased BMI    Irregular bleeding 10/2005   Multiple sclerosis    Occipital headache    POTS (postural orthostatic tachycardia syndrome)    Sleep apnea    Thyroid  disease    Fluxuate between hyper to hypo   Thyromegaly 11/2005   Vision abnormalities    Yeast vaginitis 07/2006    Family History  Problem Relation Age of Onset   Hypothyroidism Mother    Goiter Mother    Heart disease Father    Alcohol abuse Father    Melanoma Father    Diabetes Maternal Grandmother    Cancer Maternal Grandmother        Kidney   Cancer Paternal Grandmother    Cancer Maternal Aunt        Thyroid  & breast   Cancer Paternal Uncle        Esophageal   Multiple sclerosis Cousin    Past Surgical History:  Procedure Laterality Date   CHOLECYSTECTOMY  09/11/91   DILATION AND CURETTAGE OF UTERUS  2011   endometritis   DILATION AND CURETTAGE OF UTERUS  11/19/2011   Procedure: DILATATION AND CURETTAGE;  Surgeon: Shanda SHAUNNA Muscat, MD;  Location: [facility];  Service: Gynecology;  Laterality: N/A;  dilitation and currettage with repair of intraoperative cervical laceration.    TONSILLECTOMY  10/28/97   WISDOM TOOTH EXTRACTION  2001   Social History   Social History Narrative   Not on file   Immunization History  Administered Date(s) Administered   Moderna Sars-Covid-2 Vaccination 06/07/2019, 07/05/2019   Tdap 11/21/2011, 10/07/2014     Objective: Vital Signs: BP (!) 160/93   Pulse 85   Temp 97.6 F (36.4 C)   Resp 14   Ht 5' 8 (1.727 m)   Wt 199 lb 12.8 oz (90.6 kg)   LMP 04/17/2024   BMI 30.38 kg/m    Physical Exam Eyes:     Conjunctiva/sclera: Conjunctivae  normal.  Neck:     Comments: Nontender cervical adenopathy Cardiovascular:     Rate and Rhythm: Normal rate and regular rhythm.  Pulmonary:     Effort: Pulmonary effort is normal.     Breath sounds: Normal breath sounds.  Lymphadenopathy:     Cervical: Cervical adenopathy present.  Skin:    General: Skin is warm and dry.  Neurological:     Mental Status: She is alert.  Psychiatric:        Mood and Affect: Mood normal.      Musculoskeletal Exam:  Shoulders full ROM no tenderness or swelling Elbows full ROM no tenderness or swelling Wrists full ROM no tenderness or swelling Fingers full ROM no tenderness or swelling Palpable muscle knots in the neck. Tenderness and tightness in the upper and lower back Hip normal internal and external rotation without pain, left hip lateral tenderness to pressure Knees full ROM no tenderness or swelling  Investigation: No additional findings.  Imaging: No results found.  Recent Labs: Lab Results  Component Value Date   WBC 13.8 (H) 05/01/2024   HGB 15.6 (H) 05/01/2024   PLT 273 05/01/2024   NA 137 05/01/2024   K 3.9 05/01/2024   CL 103 05/01/2024   CO2 26 05/01/2024   GLUCOSE 107 (H) 05/01/2024   BUN 8 05/01/2024   CREATININE 0.69 05/01/2024   BILITOT 0.4 05/01/2024   ALKPHOS 91 11/22/2023   AST 15 05/01/2024   ALT 17 05/01/2024   PROT 7.0 05/01/2024   ALBUMIN 4.6 11/22/2023   CALCIUM 9.7 05/01/2024   GFRAA 119 01/07/2019    QFTBGOLD Negative 10/13/2016   QFTBGOLDPLUS NEGATIVE 12/01/2023    Speciality Comments: No specialty comments available.  Procedures:  No procedures performed Allergies: Codeine, Eye-sed ophthalmic [zinc], Ocrelizumab , and Percocet [oxycodone -acetaminophen ]   Assessment / Plan:     Visit Diagnoses: Ankylosing spondylitis, unspecified site of spine (HCC) - Plan: Sedimentation rate Chronic condition with recent flare in left hip and groin. Current treatment with Cosentyx  shows no significant improvement yet but is early on treatment course. Previous good response to Cosentyx . Discussed potential benefits of GLP-1 agonists for inflammation. - Continue Cosentyx  300 mg [place] monthly - Continue physical therapy twice a week. - Recheck blood tests to monitor response to Cosentyx . - Consider alternative treatments if no improvement in 3 months.  Hidradenitis suppurativa - Plan: Sedimentation rate Chronic condition with current flare in the groin. No significant improvement with Cosentyx . Discussed non-drug interventions such as smoking cessation and weight loss. - Continue Cosentyx  as prescribed. - Encouraged smoking cessation and weight loss. - Consider alternative treatments if no improvement in 3 months.  High risk medication use - Plan: CBC with Differential/Platelet, Comprehensive metabolic panel with GFR Interval infection with COVID but prior to cosentyx  start. Local injcetion reactions otherwise tolerating well. - Checking CBC and CMP for medication monitoring on cosentyx  after new drug start  Ehlers-Danlos syndrome Chronic condition with non-skeletal symptoms. Reports joint pain and subluxation, particularly in the thumb and hip. - Continue physical therapy twice a week.        Orders: Orders Placed This Encounter  Procedures   Sedimentation rate   CBC with Differential/Platelet   Comprehensive metabolic panel with GFR   No orders of the defined types were placed in this  encounter.    Follow-Up Instructions: Return in about 3 months (around 07/30/2024) for AS/HS on COS f/u 3mos.   Lonni LELON Ester, MD  Note - This record has been created  using Animal nutritionist.  Chart creation errors have been sought, but may not always  have been located. Such creation errors do not reflect on  the standard of medical care. "

## 2024-04-17 NOTE — Telephone Encounter (Signed)
 Received a fax from [employer] regarding an approval for COSENTYX  SQ patient assistance from 04/17/2024 to 04/10/2025. Approval letter sent to scan center.  Phone: [phone] Fax: [phone]

## 2024-04-17 NOTE — Telephone Encounter (Signed)
 Received sign MD form  Submitted Patient Assistance RENEWAL Application to [employer] for COSENTYX  SQ with patient portion, provider portion, and current medication list. Will update patient when we receive a response.  Phone: [phone] Fax: [phone]

## 2024-04-18 ENCOUNTER — Encounter: Admitting: Physical Therapy

## 2024-04-23 ENCOUNTER — Ambulatory Visit: Admitting: Physical Therapy

## 2024-04-23 ENCOUNTER — Encounter: Payer: Self-pay | Admitting: Physical Therapy

## 2024-04-23 DIAGNOSIS — M542 Cervicalgia: Secondary | ICD-10-CM

## 2024-04-23 DIAGNOSIS — M357 Hypermobility syndrome: Secondary | ICD-10-CM

## 2024-04-23 DIAGNOSIS — M25552 Pain in left hip: Secondary | ICD-10-CM

## 2024-04-23 DIAGNOSIS — M6281 Muscle weakness (generalized): Secondary | ICD-10-CM

## 2024-04-23 NOTE — Therapy (Signed)
 " OUTPATIENT PHYSICAL THERAPY NOTE   Patient Name: Brittney Harrison MRN: [id number] DOB:23-Mar-1981, 44 y.o., female Today's Date: 04/23/2024  END OF SESSION:  PT End of Session - 04/23/24 1108     Visit Number 4    Number of Visits 16    Date for Recertification  05/13/24    Authorization Type MCR A and B    PT Start Time 1105    PT Stop Time 1145    PT Time Calculation (min) 40 min    Behavior During Therapy [facility] for tasks assessed/performed;Anxious            Past Medical History:  Diagnosis Date   Abnormal Pap smear 2007   CIN-1   ADHD    Ankylosing spondylitis (HCC)    ASCUS with positive high risk HPV 05/2005   Asthma    CIN I (cervical intraepithelial neoplasia I) 06/2005   Clitoral irritation 07/2006   Complication of anesthesia    severe vomiting   DDD (degenerative disc disease), cervical    DDD (degenerative disc disease), lumbar    Fatigue    Fibrocystic breast 2007   Fullness of breast 01/2007   right   GAD (generalized anxiety disorder)    GERD (gastroesophageal reflux disease)    H/O seasonal allergies    H/O varicella    Headache(784.0)    migraines    Hidradenitis suppurativa    Hx: UTI (urinary tract infection)    Increased BMI    Irregular bleeding 10/2005   Multiple sclerosis    Occipital headache    POTS (postural orthostatic tachycardia syndrome)    Sleep apnea    Thyroid  disease    Fluxuate between hyper to hypo   Thyromegaly 11/2005   Vision abnormalities    Yeast vaginitis 07/2006   Past Surgical History:  Procedure Laterality Date   CHOLECYSTECTOMY  09/11/91   DILATION AND CURETTAGE OF UTERUS  2011   endometritis   DILATION AND CURETTAGE OF UTERUS  11/19/2011   Procedure: DILATATION AND CURETTAGE;  Surgeon: Shanda SHAUNNA Muscat, MD;  Location: [facility];  Service: Gynecology;  Laterality: N/A;  dilitation and currettage with repair of intraoperative cervical laceration.   TONSILLECTOMY  10/28/97   WISDOM TOOTH EXTRACTION  2001   Patient  Active Problem List   Diagnosis Date Noted   Hypermobile Ehlers-Danlos syndrome 02/22/2024   Myofascial pain 01/30/2024   Neck pain 06/06/2022   POTS (postural orthostatic tachycardia syndrome) 11/29/2021   Other headache syndrome 11/25/2019   Fever 08/14/2019   Ankylosing spondylitis (HCC) 05/22/2019   High risk medication use 05/22/2019   Pneumonia 06/16/2017   Multifocal pneumonia 06/15/2017   Left optic neuritis 01/23/2017   Multiple sclerosis 10/13/2016   Gait disturbance 10/13/2016   Other fatigue 10/13/2016   Numbness 10/13/2016   Urinary urgency 10/13/2016   Kell isoimmunization during pregnancy    AMA (advanced maternal age) multigravida 35+    Multiple sclerosis complicating pregnancy    Advanced maternal age in multigravida    Vaginal delivery 11/19/2011   PPH (postpartum hemorrhage) 11/19/2011   Hyperthyroidism 07/27/2011   Asthma 07/27/2011   GERD (gastroesophageal reflux disease) 07/27/2011   IBS (irritable bowel syndrome) 07/27/2011   Migraines 07/27/2011   Tobacco abuse 07/27/2011    PCP: Kip Righter, MD   REFERRING PROVIDER: Joane Birmingham MD   REFERRING DIAG: Joane Birmingham MD   Rationale for Evaluation and Treatment: Rehabilitation  THERAPY DIAG:  Hypermobility syndrome  Muscle weakness (generalized)  Pain in  left hip  Cervicalgia  ONSET DATE: chronic   SUBJECTIVE:                                                                                                                                                                                           SUBJECTIVE STATEMENT:  Patient had to cancel due to a painful, stiff neck last time.  It is still sore.   I had some post exertional malaise.       Patient was formally diagnosed with Ehlers-Danlos after a complete genetic workup see below.  She presents today for physical therapy evaluation at the request of Dr. Joane.   She originally brought it up to her Neurologist and did the testing with  [facility].  Dr. Jeannetta referred to Dr. Joane who confirmed the diagnosis.    She currently has difficulty with the following activities due to joint instability pain and fatigue:   Standing> 10 min, walking>20-30 min, uses cane, bending forward and coming back up. Diff walking up due to fatigue in mm (low back)  Headaches, pain with cervical motions.  Pain in wrist/hand with Writing.  Clumsy.   Areas most affected are her L hip, low back and Neck Spasticity in her Rt LE>  She would like to be able to stand at a concert and be able to do things without a flare or a 4 day .  I have to plan my life around my pain.   She has had physical therapy before (2021) and results were decent.   She foes a lot of research and is very knowledgeable about her conditions.    Patient reports symptoms and/or instability in the following areas:  - Cervical Spine: [x] Pain [x] Instability [] Limited ROM [] Paresthesia  - Thoracic Spine: [] Pain [] Instability  - Lumbar Spine: [x] Pain [x] Instability [] Frequent locking/giving out  - Shoulder: [] L [] R  [] Subluxation [] Dislocations [] Pain [] Fatigue  - Elbow: [] L [] R  [] Instability [] Hyperextension [] Pain  - Wrist/Hand: [] L [x] R  [] Instability [] Fatigue with use [x] Pain due to OA , pain with gripping, writing. Tight when flared with AS  - Hip: [x] L [x] R  [x] Instability [x] Pain [] Clicking [] Gait deviations , sublux Lt hip  - Knee: [] L [x] R  [] Instability [] Hyperextension [x] Pain OA  - Ankle/Foot: [x] L [] R  [] Instability [] Frequent sprains  X Pain , sublux? Rt foot pain hx     PERTINENT HISTORY:  Reviewed Ehlers-Danlos syndrome genetic panel from the [facility] lab from March 2024. Patient had 22 genes detected typically associated with the variants of Ehlers-Danlos and Marfan's etc. No reportable or pathological variants were identified. Report will be sent to scanned in lab section. Cousin  has EDS, kids as well, brother?   MSK: Hypermobility  evaluation: Beighton score +5/9. EDS evaluation positive score of 6 including unusually soft or velvety skin, mild skin hyperextensibility, unexplained stretch marks, bilateral piezogenic papules, atrophic scarring, dental crowding  History of ankylosing spondylitis, hidradenitis suppurativa, and multiple sclerosis.  Relevant historical information:     PAIN:  Are you having pain? Yes: NPRS scale: 7/10 Pain location: neck bilateral post and lateral to jaw Pain description: sharp, achiness, tightness  Aggravating factors: is constant Relieving factors: medicine, pillow   Are you having pain? Yes: NPRS scale: 4/10-6/10  Pain location: L hip (post)  and back  Pain description: sore, twinge Aggravating factors: moving the wrong way  Relieving factors: repositioning, pillows    PRECAUTIONS: None spasticity in Rt LE   RED FLAGS: None  Some incontinence with GI issues   WEIGHT BEARING RESTRICTIONS: No  FALLS:  Has patient fallen in last 6 months? 3 x in the past week, many near falls    LIVING ENVIRONMENT: Lives with: lives with their family Lives in: House/apartment Stairs: Yes: Internal: unk steps; on right going up Has following equipment at home: Single point cane  OCCUPATION: disability   PLOF: Independent with basic ADLs, Vocation/Vocational requirements: shower chair,, Leisure: concerts, and    PATIENT GOALS: See above  NEXT MD VISIT: 03/22/24  OBJECTIVE:  Note: Objective measures were completed at Evaluation unless otherwise noted.  DIAGNOSTIC FINDINGS:  IMPRESSION: This MRI of the cervical spine with and without contrast shows the following: There is a T2 hyperintense focus posterolaterally to the left within the spinal cord adjacent to C6, unchanged in appearance compared to the 2021 MRI.  It does not enhance. Multilevel degenerative changes as detailed above that does not lead to spinal stenosis or nerve root compression.  There are endplate degenerative  changes with edema and enhancement at C6-C7. Compared to the MRI from 2021, the degenerative changes at C4-C5 have mildly progressed.  The enhancement at C6-C7 is more intense.  Lumbar 2020 IMPRESSION: This MRI of the lumbar spine without contrast shows the following: 1.   There are mild degenerative changes at L3-L4, L4-L5 and L5-S1 that do not lead to nerve root compression though there is mild to moderate foraminal narrowing to the right at L4-L5. 2.   No evidence of demyelination in the conus medullaris.    PATIENT SURVEYS:  NT   COGNITION: Overall cognitive status: Within functional limits for tasks assessed     SENSATION: WFL   POSTURE: rounded shoulders, forward head, and swayback   PALPATION: Hypermobility in Lumbar segments and pain  Pain Lt lateral SI border> Rt    LUMBAR ROM:   AROM eval  Flexion WNL palms flat  Extension WNL pain central   Right lateral flexion Stiff 25%   Left lateral flexion Stiff 25% Pain   Right rotation WFL  Left rotation WFL    (Blank rows = not tested)  LOWER EXTREMITY ROM:   WFL AROM  Passive  Right eval Left eval  Hip flexion Pinching ant  Pinching ant   Hip extension    Hip abduction    Hip adduction    Hip internal rotation Hyper with pain Hyper with pain   Hip external rotation hyper hyper  Knee flexion    Knee extension    Ankle dorsiflexion    Ankle plantarflexion    Ankle inversion    Ankle eversion     (Blank rows = not tested)  LOWER  EXTREMITY MMT:    MMT Right eval Left eval  Hip flexion 4 4-  Hip extension 3+ 3+  Hip abduction 4 3+  Hip adduction    Hip internal rotation    Hip external rotation    Knee flexion 4+ 4+  Knee extension 5 5  Ankle dorsiflexion 5 5  Ankle plantarflexion    Ankle inversion    Ankle eversion     (Blank rows = not tested)  LUMBAR SPECIAL TESTS:  Straight leg raise test: Negative, Single leg stance test: Positive, and FABER test: Positive L SIJ  Pain in L low back with  SLR    CERVICAL ROM: NT   Active ROM A/PROM (deg) 04/23/2024  Flexion 54 pulling  Extension 65 min central  Right lateral flexion 40  Left lateral flexion 36  Right rotation WFL crispies  Left rotation WFL crispies   (Blank rows = not tested)  UE ROM: WNL  Active ROM Right 04/23/2024 Left 04/23/2024  Shoulder flexion    Shoulder extension    Shoulder abduction    Shoulder adduction    Shoulder extension    Shoulder internal rotation    Shoulder external rotation    Elbow flexion    Elbow extension    Wrist flexion    Wrist extension    Wrist ulnar deviation    Wrist radial deviation    Wrist pronation    Wrist supination     (Blank rows = not tested)  UE MMT:  MMT Right 04/23/2024 Left 04/23/2024  Shoulder flexion 4- 3+  Shoulder extension    Shoulder abduction 3+ 3+  Shoulder adduction    Shoulder extension    Shoulder internal rotation 4+ 4+  Shoulder external rotation 4+ 4  Middle trapezius    Lower trapezius    Elbow flexion 4+ 4+  Elbow extension 4 4  Wrist flexion    Wrist extension    Wrist ulnar deviation    Wrist radial deviation    Wrist pronation    Wrist supination    Grip strength 43 39   (Blank rows = not tested)  CERVICAL SPECIAL TESTS:  NT   FUNCTIONAL TESTS:  5 times sit to stand: 23 sec SLS Rt LE 10 sec, Lt. < 5 sec  Cervical DNF 5 sec    GAIT: Distance walked: 100 Assistive device utilized: Single point cane Level of assistance: Complete Independence Comments: slow pace    BERG BALANCE  Sitting to Standing: Numbers; 0-4: 4  4. Stands without using hands and stabilize independently  3. Stands independently using hands  2. Stands using hands after multiple trials  1. Min A to stand  0. Mod-Max A to stand Standing unsupported: Numbers; 0-4: 4  4. Stands safely for 2 minutes  3. Stands 2 minutes with supervision  2. Stands 30 seconds unsupported  1. Needs several tries to stand unsupported for 30 seconds  0. Unable  to stand unsupported for 30 seconds Sitting unsupported: Numbers; 0-4: 4  4. Sits for 2 minutes independently  3. Sits for 2 minutes with supervision  2. Able to sit 30 seconds  1. Able to sit 10 seconds  0. Unable to sit for 10 seconds Standing to Sitting: Numbers; 0-4: 4 4. Sits safely with minimal use of hands 3. Controls descent with hands 2. Uses back of legs against chair to control descent 1. Sits independently, but uncontrolled descent 0. Needs assistance Transfers: Numbers; 0-4: 4- increased time   4.  Transfers safely with minor use of hands  3. Transfers safely definite use of hands  2. Transfers with verbal cueing/supervision  1. Needs 1 person assist  0. Needs 2 person assist  Standing with eyes closed: Numbers; 0-4: 4  4. Stands safely for 10 seconds  3. Stands 10 seconds with supervision   2. Able to stand for 3 seconds  1. Unable to keep eyes closed for 3 seconds, but is safe  0. Needs assist to keep from falling Standing with feet together: Numbers; 0-4: 4 4. Stands for 1 minute safely 3. Stands for 1 minute with supervision 2. Unable to hold for 30 seconds  1. Needs help to attain position but can hold for 15 seconds  0. Needs help to attain position and unable to hold for 15 seconds Reaching forward with outstretched arm: Numbers; 0-4: 4  4. Reaches forward 10 inches  3. Reaches forward 5 inches  2. Reaches forward 2 inches  1. Reaches forward with supervision  0. Loses balance/requires assistace Retrieving object from the floor: Numbers; 0-4: 4 4. Able to pick up easily and safely 3. Able to pick up with supervision 2. Unable to pick up, but reaches within 1-2 inches independently 1. Unable to pick up and needs supervision 0. Unable/needs assistance to keep from falling  Turning to look behind: Numbers; 0-4: 4  4. Looks behind from both sides and weight shifts well  3. Looks behind one side only, other side less weight shift  2. Turns sideways only,  maintains balance  1. Needs supervision when turning  0. Needs assistance  Turning 360 degrees: Numbers; 0-4: 4  4. Able to turn in </=4 seconds  3. Able to turn on one side in </= 4 seconds   2. Able to turn slowly, but safely  1. Needs supervision or verbal cueing  0. Needs assistance Place alternate foot on stool: Numbers; 0-4: 3 4. Completes 8 steps in 20 seconds 3. Completes 8 steps in >20 seconds 2. 4 steps without assistance/supervision 1. Completes >2 steps with minimal assist 0. Unable, needs assist to keep from falling Standing with one foot in front: Numbers; 0-4: 3, difficulty with Rt LE back   4. Independent tandem for 30 seconds  3. Independent foot ahead for 30 seconds  2. Independent small step for 30 seconds  1. Needs help to step, but can hold for 15 seconds  0. Loses balance while standing/stepping Standing on one foot: Numbers; 0-4: 3, LLE 10 sec , Rt LE unable  4. Holds >10 seconds 3. Holds 5-10 seconds 2. Holds >/=3 seconds  1. Holds <3 seconds 0. Unable   Total Score: 53/56 patient uses a cane due to right leg muscle spasms and when she is uncertain of the location.  She does not use a device in her home  TREATMENT DATE:   [facility] Adult PT Treatment:                                                DATE: 04/23/24 Manual Therapy: KT tape to upper traps  1 strip I with 50% in the center 1 Y strip post cervical to facilitate cervical ext  Therapeutic Activity: Cervical rotation Chin tuck x 10 into towel  Added arm arcs and horizontal abduction double and single  Femur Arcs x 10 each added band to reduce  instability  Dead bug to 90 deg hip and shoulder  Banded bridge x 10  Bridge unable to get to 75% due to pain in back  Open book each side added bent elbow x 5  Cervical towel stretches side bending/rot. (upper trap, levator)  and flex/ext Piriformis stretches seated fig 4   [facility] Adult PT Treatment:                                                 DATE: 04/16/24 Therapeutic Exercise: Recumbent bike L1 for 5 min  Hip flexor  Row blue band x 15  Extension blue band x 10  Horizontal abduction red band x 15 Looped red band ER x 15 Looped added chest press x 15 red OH lift red x 10   Therapeutic Activity: Standing posture cues and exercises  Berg Balance Test      [facility] Adult PT Treatment:                                                DATE: 04/12/24 Manual Therapy: Cervical isometrics all motions Gentle cervical retraction  Therapeutic Activity: TrA  TrA with march (increased head pain)  Tr A with clam alternating  Chin tuck Chin tuck with lift off  Standing green Thera-Band shoulder elevation depression Seated plus pulls green band Self Care: Cervical proprioception, spinal stability and sense of upper trap tension, stabilization concepts throughout  [facility] Adult PT Treatment:                                                DATE: 03/18/24 Self Care: Neutral spine, A/P tilt, core and basic stability      POC, HEP                                                                                                                    PATIENT EDUCATION:  Education details: Posture, alignment, neutral zone and joint protection PNE/Explain pain   Joint inflammation/collagen/ligament laxity, muscular support Stretching vs stabilizing  Person educated: Patient Education method: Explanation, Demonstration, and Handouts Education comprehension: verbalized understanding and returned demonstration   HOME EXERCISE PROGRAM: Access Code: [email or web address] URL: [email or web address] Date: 03/18/2024 Prepared by: Delon Norma Access Code: [email or web address] URL: [email or web address] Date: 04/12/2024 Prepared by: Delon Norma  Exercises - Hooklying Transversus Abdominis Palpation  - 1 x daily - 7 x weekly - 2 sets - 10 reps - 5 hold - Supine March  - 1 x daily - 7 x weekly - 2 sets - 10 reps - 5 hold - Seated Transversus  Abdominis Bracing  - 1 x  daily - 7 x weekly - 2 sets - 10 reps - 5 hold - Standing Isometric Cervical Sidebending with Manual Resistance  - 1 x daily - 7 x weekly - 2 sets - 5-10 reps - 5 hold - Standing Isometric Cervical Rotation  - 1 x daily - 7 x weekly - 2 sets - 5-10 reps - 5 hold - Seated Isometric Cervical Flexion  - 1 x daily - 7 x weekly - 2 sets - 5-10 reps - 5 hold - Supine Cervical Retraction with Towel  - 1 x daily - 7 x weekly - 2 sets - 10 reps - 5 hold - Standing Shoulder Shrugs with Dumbbells  - 1 x daily - 7 x weekly - 2 sets - 10 reps - 5 hold - Standing Shoulder Row with Anchored Resistance  - 1 x daily - 7 x weekly - 2 sets - 10 reps - 5 hold  ASSESSMENT:  CLINICAL IMPRESSION:  Patient reports increased neck pain after what she feels was due to poor sleep position.  We took time to integrate cervical stability into core/.  patient was limited in the amount of height she can get in her bridge due to pain in her back.  Utilized Kinesiotape in order to inhibit the upper traps and support the neck a bit better.  Patient continues to benefit from skilled PT in order to manage her symptoms and improve function.    Patient is a 44 y.o. female  who was seen today for physical therapy evaluation and treatment for hypermobility syndrome/hEDS with a complicated medical history. Focus today was on LE and back but does also complain of neck pain and headaches- will complete next visit.   OBJECTIVE IMPAIRMENTS: Abnormal gait, cardiopulmonary status limiting activity, decreased activity tolerance, decreased balance, decreased coordination, decreased endurance, decreased mobility, difficulty walking, decreased strength, improper body mechanics, postural dysfunction, pain, and spasticity/tone, Hypermobility .   ACTIVITY LIMITATIONS: carrying, lifting, bending, sitting, standing, squatting, sleeping, stairs, transfers, reach over head, locomotion level, and caring for others  PARTICIPATION  LIMITATIONS: meal prep, cleaning, laundry, interpersonal relationship, driving, shopping, community activity, and occupation  PERSONAL FACTORS: Past/current experiences, Social background, Time since onset of injury/illness/exacerbation, and 3+ comorbidities: ankylosing spondylitis, multiple sclerosis, EDS, POTS  are also affecting patient's functional outcome.   REHAB POTENTIAL: Good  CLINICAL DECISION MAKING: Unstable/unpredictable  EVALUATION COMPLEXITY: High   GOALS: Goals reviewed with patient? Yes  SHORT TERM GOALS: Target date: 04/15/2024  Patient will be able to show independence for initial HEP to include posture, core and hip strength and stability.   Baseline: Goal status: Ongoing  2.  Patient will be able to tolerate functional testing and set goals (Balance and endurance)  Baseline:  Goal status: Ongoing  3.  Patient will understand and perform proper hip hinge for home tasks (laundry, reaching) and other strategies to reduce pain in back, neck.  Baseline:  Goal status: Ongoing  4.  Patient will notice improved core/abdominal support with functional activities/ADLs.  Baseline:  Goal status: Ongoing  LONG TERM GOALS: Target date: 05/13/2024    Patient will be independent with final HEP upon discharge from PT and report consistent benefit following exercise completion.    Baseline:  Goal status: INITIAL  2.  Patient will be able to demonstrate proper llifting techniques related to spine health and reduction of symptoms, items < 25 lbs from the floor.  Baseline:  Goal status: INITIAL  3.  Patient will be able to stand for  30 min with no more than min increase in hip, back pain  Baseline:  Goal status: INITIAL  4.  Patient will be able to stand in tandem with either leg forward for 30 sec  Baseline: diff with Rt leg back Goal status: INITIAL  5.  Patient will be able to perform 30 seconds sit to stand more than 8 repetitions Baseline:  Goal status:  INITIAL  PLAN:  PT FREQUENCY: 2x/week  PT DURATION: 8 weeks  PLANNED INTERVENTIONS: [id number]- PT Re-evaluation, 97750- Physical Performance Testing, 97110-Therapeutic exercises, 97530- Therapeutic activity, [id number]- Neuromuscular re-education, 97535- Self Care, [id number]- Manual therapy, [id number]- Gait training, [id number]- Electrical stimulation (unattended), Patient/Family education, Balance training, Stair training, Taping, Spinal mobilization, Cryotherapy, and Moist heat.   PLAN FOR NEXT SESSION:  HEP for core, posture.  Hinging /body mechanics.  Standing posture.  Core stability hip strength   Devanshi Califf, PT 04/23/2024, 11:09 AM    Delon Norma, PT 04/23/2024 11:09 AM Phone: [phone] Fax: [phone]  "

## 2024-04-25 ENCOUNTER — Encounter: Admitting: Physical Therapy

## 2024-04-25 NOTE — Therapy (Unsigned)
 " OUTPATIENT PHYSICAL THERAPY NOTE   Patient Name: Brittney Harrison MRN: [id number] DOB:1980-09-12, 44 y.o., female Today's Date: 04/25/2024  END OF SESSION:      Past Medical History:  Diagnosis Date   Abnormal Pap smear 2007   CIN-1   ADHD    Ankylosing spondylitis (HCC)    ASCUS with positive high risk HPV 05/2005   Asthma    CIN I (cervical intraepithelial neoplasia I) 06/2005   Clitoral irritation 07/2006   Complication of anesthesia    severe vomiting   DDD (degenerative disc disease), cervical    DDD (degenerative disc disease), lumbar    Fatigue    Fibrocystic breast 2007   Fullness of breast 01/2007   right   GAD (generalized anxiety disorder)    GERD (gastroesophageal reflux disease)    H/O seasonal allergies    H/O varicella    Headache(784.0)    migraines    Hidradenitis suppurativa    Hx: UTI (urinary tract infection)    Increased BMI    Irregular bleeding 10/2005   Multiple sclerosis    Occipital headache    POTS (postural orthostatic tachycardia syndrome)    Sleep apnea    Thyroid  disease    Fluxuate between hyper to hypo   Thyromegaly 11/2005   Vision abnormalities    Yeast vaginitis 07/2006   Past Surgical History:  Procedure Laterality Date   CHOLECYSTECTOMY  09/11/91   DILATION AND CURETTAGE OF UTERUS  2011   endometritis   DILATION AND CURETTAGE OF UTERUS  11/19/2011   Procedure: DILATATION AND CURETTAGE;  Surgeon: Shanda SHAUNNA Muscat, MD;  Location: [facility];  Service: Gynecology;  Laterality: N/A;  dilitation and currettage with repair of intraoperative cervical laceration.   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   Myofascial pain 01/30/2024   Neck pain 06/06/2022   POTS (postural orthostatic tachycardia syndrome) 11/29/2021   Other headache syndrome 11/25/2019   Fever 08/14/2019   Ankylosing spondylitis (HCC) 05/22/2019    High risk medication use 05/22/2019   Pneumonia 06/16/2017   Multifocal pneumonia 06/15/2017   Left optic neuritis 01/23/2017   Multiple sclerosis 10/13/2016   Gait disturbance 10/13/2016   Other fatigue 10/13/2016   Numbness 10/13/2016   Urinary urgency 10/13/2016   Kell isoimmunization during pregnancy    AMA (advanced maternal age) multigravida 35+    Multiple sclerosis complicating pregnancy    Advanced maternal age in multigravida    Vaginal delivery 11/19/2011   PPH (postpartum hemorrhage) 11/19/2011   Hyperthyroidism 07/27/2011   Asthma 07/27/2011   GERD (gastroesophageal reflux disease) 07/27/2011   IBS (irritable bowel syndrome) 07/27/2011   Migraines 07/27/2011   Tobacco abuse 07/27/2011    PCP: Kip Righter, MD   REFERRING PROVIDER: Joane Birmingham MD   REFERRING DIAG: Joane Birmingham MD   Rationale for Evaluation and Treatment: Rehabilitation  THERAPY DIAG:  No diagnosis found.  ONSET DATE: chronic   SUBJECTIVE:  SUBJECTIVE STATEMENT:  Patient had to cancel due to a painful, stiff neck last time.  It is still sore.   I had some post exertional malaise.       Patient was formally diagnosed with Ehlers-Danlos after a complete genetic workup see below.  She presents today for physical therapy evaluation at the request of Dr. Joane.   She originally brought it up to her Neurologist and did the testing with [facility].  Dr. Jeannetta referred to Dr. Joane who confirmed the diagnosis.    She currently has difficulty with the following activities due to joint instability pain and fatigue:   Standing> 10 min, walking>20-30 min, uses cane, bending forward and coming back up. Diff walking up due to fatigue in mm (low back)  Headaches, pain with cervical motions.  Pain in wrist/hand with  Writing.  Clumsy.   Areas most affected are her L hip, low back and Neck Spasticity in her Rt LE>  She would like to be able to stand at a concert and be able to do things without a flare or a 4 day .  I have to plan my life around my pain.   She has had physical therapy before (2021) and results were decent.   She foes a lot of research and is very knowledgeable about her conditions.    Patient reports symptoms and/or instability in the following areas:  - Cervical Spine: [x] Pain [x] Instability [] Limited ROM [] Paresthesia  - Thoracic Spine: [] Pain [] Instability  - Lumbar Spine: [x] Pain [x] Instability [] Frequent locking/giving out  - Shoulder: [] L [] R  [] Subluxation [] Dislocations [] Pain [] Fatigue  - Elbow: [] L [] R  [] Instability [] Hyperextension [] Pain  - Wrist/Hand: [] L [x] R  [] Instability [] Fatigue with use [x] Pain due to OA , pain with gripping, writing. Tight when flared with AS  - Hip: [x] L [x] R  [x] Instability [x] Pain [] Clicking [] Gait deviations , sublux Lt hip  - Knee: [] L [x] R  [] Instability [] Hyperextension [x] Pain OA  - Ankle/Foot: [x] L [] R  [] Instability [] Frequent sprains  X Pain , sublux? Rt foot pain hx     PERTINENT HISTORY:  Reviewed Ehlers-Danlos syndrome genetic panel from the [facility] lab from March 2024. Patient had 22 genes detected typically associated with the variants of Ehlers-Danlos and Marfan's etc. No reportable or pathological variants were identified. Report will be sent to scanned in lab section. Cousin has EDS, kids as well, brother?   MSK: Hypermobility evaluation: Beighton score +5/9. EDS evaluation positive score of 6 including unusually soft or velvety skin, mild skin hyperextensibility, unexplained stretch marks, bilateral piezogenic papules, atrophic scarring, dental crowding  History of ankylosing spondylitis, hidradenitis suppurativa, and multiple sclerosis.  Relevant historical information:     PAIN:  Are you having pain?  Yes: NPRS scale: 7/10 Pain location: neck bilateral post and lateral to jaw Pain description: sharp, achiness, tightness  Aggravating factors: is constant Relieving factors: medicine, pillow   Are you having pain? Yes: NPRS scale: 4/10-6/10  Pain location: L hip (post)  and back  Pain description: sore, twinge Aggravating factors: moving the wrong way  Relieving factors: repositioning, pillows    PRECAUTIONS: None spasticity in Rt LE   RED FLAGS: None  Some incontinence with GI issues   WEIGHT BEARING RESTRICTIONS: No  FALLS:  Has patient fallen in last 6 months? 3 x in the past week, many near falls    LIVING ENVIRONMENT: Lives with: lives with their family Lives in: House/apartment Stairs: Yes: Internal: unk steps; on right going up Has following equipment  at home: Single point cane  OCCUPATION: disability   PLOF: Independent with basic ADLs, Vocation/Vocational requirements: shower chair,, Leisure: concerts, and    PATIENT GOALS: See above  NEXT MD VISIT: 03/22/24  OBJECTIVE:  Note: Objective measures were completed at Evaluation unless otherwise noted.  DIAGNOSTIC FINDINGS:  IMPRESSION: This MRI of the cervical spine with and without contrast shows the following: There is a T2 hyperintense focus posterolaterally to the left within the spinal cord adjacent to C6, unchanged in appearance compared to the 2021 MRI.  It does not enhance. Multilevel degenerative changes as detailed above that does not lead to spinal stenosis or nerve root compression.  There are endplate degenerative changes with edema and enhancement at C6-C7. Compared to the MRI from 2021, the degenerative changes at C4-C5 have mildly progressed.  The enhancement at C6-C7 is more intense.  Lumbar 2020 IMPRESSION: This MRI of the lumbar spine without contrast shows the following: 1.   There are mild degenerative changes at L3-L4, L4-L5 and L5-S1 that do not lead to nerve root compression though there is  mild to moderate foraminal narrowing to the right at L4-L5. 2.   No evidence of demyelination in the conus medullaris.    PATIENT SURVEYS:  NT   COGNITION: Overall cognitive status: Within functional limits for tasks assessed     SENSATION: WFL   POSTURE: rounded shoulders, forward head, and swayback   PALPATION: Hypermobility in Lumbar segments and pain  Pain Lt lateral SI border> Rt    LUMBAR ROM:   AROM eval  Flexion WNL palms flat  Extension WNL pain central   Right lateral flexion Stiff 25%   Left lateral flexion Stiff 25% Pain   Right rotation WFL  Left rotation WFL    (Blank rows = not tested)  LOWER EXTREMITY ROM:   WFL AROM  Passive  Right eval Left eval  Hip flexion Pinching ant  Pinching ant   Hip extension    Hip abduction    Hip adduction    Hip internal rotation Hyper with pain Hyper with pain   Hip external rotation hyper hyper  Knee flexion    Knee extension    Ankle dorsiflexion    Ankle plantarflexion    Ankle inversion    Ankle eversion     (Blank rows = not tested)  LOWER EXTREMITY MMT:    MMT Right eval Left eval  Hip flexion 4 4-  Hip extension 3+ 3+  Hip abduction 4 3+  Hip adduction    Hip internal rotation    Hip external rotation    Knee flexion 4+ 4+  Knee extension 5 5  Ankle dorsiflexion 5 5  Ankle plantarflexion    Ankle inversion    Ankle eversion     (Blank rows = not tested)  LUMBAR SPECIAL TESTS:  Straight leg raise test: Negative, Single leg stance test: Positive, and FABER test: Positive L SIJ  Pain in L low back with SLR    CERVICAL ROM: NT   Active ROM A/PROM (deg) 04/25/2024  Flexion 54 pulling  Extension 65 min central  Right lateral flexion 40  Left lateral flexion 36  Right rotation WFL crispies  Left rotation WFL crispies   (Blank rows = not tested)  UE ROM: WNL  Active ROM Right 04/25/2024 Left 04/25/2024  Shoulder flexion    Shoulder extension    Shoulder abduction    Shoulder  adduction    Shoulder extension    Shoulder internal  rotation    Shoulder external rotation    Elbow flexion    Elbow extension    Wrist flexion    Wrist extension    Wrist ulnar deviation    Wrist radial deviation    Wrist pronation    Wrist supination     (Blank rows = not tested)  UE MMT:  MMT Right 04/25/2024 Left 04/25/2024  Shoulder flexion 4- 3+  Shoulder extension    Shoulder abduction 3+ 3+  Shoulder adduction    Shoulder extension    Shoulder internal rotation 4+ 4+  Shoulder external rotation 4+ 4  Middle trapezius    Lower trapezius    Elbow flexion 4+ 4+  Elbow extension 4 4  Wrist flexion    Wrist extension    Wrist ulnar deviation    Wrist radial deviation    Wrist pronation    Wrist supination    Grip strength 43 39   (Blank rows = not tested)  CERVICAL SPECIAL TESTS:  NT   FUNCTIONAL TESTS:  5 times sit to stand: 23 sec SLS Rt LE 10 sec, Lt. < 5 sec  Cervical DNF 5 sec    GAIT: Distance walked: 100 Assistive device utilized: Single point cane Level of assistance: Complete Independence Comments: slow pace    BERG BALANCE  Sitting to Standing: Numbers; 0-4: 4  4. Stands without using hands and stabilize independently  3. Stands independently using hands  2. Stands using hands after multiple trials  1. Min A to stand  0. Mod-Max A to stand Standing unsupported: Numbers; 0-4: 4  4. Stands safely for 2 minutes  3. Stands 2 minutes with supervision  2. Stands 30 seconds unsupported  1. Needs several tries to stand unsupported for 30 seconds  0. Unable to stand unsupported for 30 seconds Sitting unsupported: Numbers; 0-4: 4  4. Sits for 2 minutes independently  3. Sits for 2 minutes with supervision  2. Able to sit 30 seconds  1. Able to sit 10 seconds  0. Unable to sit for 10 seconds Standing to Sitting: Numbers; 0-4: 4 4. Sits safely with minimal use of hands 3. Controls descent with hands 2. Uses back of legs against chair to  control descent 1. Sits independently, but uncontrolled descent 0. Needs assistance Transfers: Numbers; 0-4: 4- increased time   4. Transfers safely with minor use of hands  3. Transfers safely definite use of hands  2. Transfers with verbal cueing/supervision  1. Needs 1 person assist  0. Needs 2 person assist  Standing with eyes closed: Numbers; 0-4: 4  4. Stands safely for 10 seconds  3. Stands 10 seconds with supervision   2. Able to stand for 3 seconds  1. Unable to keep eyes closed for 3 seconds, but is safe  0. Needs assist to keep from falling Standing with feet together: Numbers; 0-4: 4 4. Stands for 1 minute safely 3. Stands for 1 minute with supervision 2. Unable to hold for 30 seconds  1. Needs help to attain position but can hold for 15 seconds  0. Needs help to attain position and unable to hold for 15 seconds Reaching forward with outstretched arm: Numbers; 0-4: 4  4. Reaches forward 10 inches  3. Reaches forward 5 inches  2. Reaches forward 2 inches  1. Reaches forward with supervision  0. Loses balance/requires assistace Retrieving object from the floor: Numbers; 0-4: 4 4. Able to pick up easily and safely 3. Able to pick up  with supervision 2. Unable to pick up, but reaches within 1-2 inches independently 1. Unable to pick up and needs supervision 0. Unable/needs assistance to keep from falling  Turning to look behind: Numbers; 0-4: 4  4. Looks behind from both sides and weight shifts well  3. Looks behind one side only, other side less weight shift  2. Turns sideways only, maintains balance  1. Needs supervision when turning  0. Needs assistance  Turning 360 degrees: Numbers; 0-4: 4  4. Able to turn in </=4 seconds  3. Able to turn on one side in </= 4 seconds   2. Able to turn slowly, but safely  1. Needs supervision or verbal cueing  0. Needs assistance Place alternate foot on stool: Numbers; 0-4: 3 4. Completes 8 steps in 20 seconds 3. Completes 8  steps in >20 seconds 2. 4 steps without assistance/supervision 1. Completes >2 steps with minimal assist 0. Unable, needs assist to keep from falling Standing with one foot in front: Numbers; 0-4: 3, difficulty with Rt LE back   4. Independent tandem for 30 seconds  3. Independent foot ahead for 30 seconds  2. Independent small step for 30 seconds  1. Needs help to step, but can hold for 15 seconds  0. Loses balance while standing/stepping Standing on one foot: Numbers; 0-4: 3, LLE 10 sec , Rt LE unable  4. Holds >10 seconds 3. Holds 5-10 seconds 2. Holds >/=3 seconds  1. Holds <3 seconds 0. Unable   Total Score: 53/56 patient uses a cane due to right leg muscle spasms and when she is uncertain of the location.  She does not use a device in her home  TREATMENT DATE:    [facility] Adult PT Treatment:                                                DATE: 04/25/24 Therapeutic Exercise: *** Manual Therapy: *** Neuromuscular re-ed: *** Therapeutic Activity: *** Modalities: *** Self Care: ***   [facility] Adult PT Treatment:                                                DATE: 04/23/24 Manual Therapy: KT tape to upper traps  1 strip I with 50% in the center 1 Y strip post cervical to facilitate cervical ext  Therapeutic Activity: Cervical rotation Chin tuck x 10 into towel  Added arm arcs and horizontal abduction double and single  Femur Arcs x 10 each added band to reduce instability  Dead bug to 90 deg hip and shoulder  Banded bridge x 10  Bridge unable to get to 75% due to pain in back  Open book each side added bent elbow x 5  Cervical towel stretches side bending/rot. (upper trap, levator)  and flex/ext Piriformis stretches seated fig 4   [facility] Adult PT Treatment:                                                DATE: 04/16/24 Therapeutic Exercise: Recumbent bike L1 for 5 min  Hip flexor  Row blue band  x 15  Extension blue band x 10  Horizontal abduction red band x  15 Looped red band ER x 15 Looped added chest press x 15 red OH lift red x 10   Therapeutic Activity: Standing posture cues and exercises  Berg Balance Test      [facility] Adult PT Treatment:                                                DATE: 04/12/24 Manual Therapy: Cervical isometrics all motions Gentle cervical retraction  Therapeutic Activity: TrA  TrA with march (increased head pain)  Tr A with clam alternating  Chin tuck Chin tuck with lift off  Standing green Thera-Band shoulder elevation depression Seated plus pulls green band Self Care: Cervical proprioception, spinal stability and sense of upper trap tension, stabilization concepts throughout  [facility] Adult PT Treatment:                                                DATE: 03/18/24 Self Care: Neutral spine, A/P tilt, core and basic stability      POC, HEP                                                                                                                    PATIENT EDUCATION:  Education details: Posture, alignment, neutral zone and joint protection PNE/Explain pain   Joint inflammation/collagen/ligament laxity, muscular support Stretching vs stabilizing  Person educated: Patient Education method: Explanation, Demonstration, and Handouts Education comprehension: verbalized understanding and returned demonstration   HOME EXERCISE PROGRAM: Access Code: [email or web address] URL: [email or web address] Date: 03/18/2024 Prepared by: Delon Norma Access Code: [email or web address] URL: [email or web address] Date: 04/12/2024 Prepared by: Delon Norma  Exercises - Hooklying Transversus Abdominis Palpation  - 1 x daily - 7 x weekly - 2 sets - 10 reps - 5 hold - Supine March  - 1 x daily - 7 x weekly - 2 sets - 10 reps - 5 hold - Seated Transversus Abdominis Bracing  - 1 x daily - 7 x weekly - 2 sets - 10 reps - 5 hold - Standing Isometric Cervical Sidebending with Manual Resistance  - 1 x daily - 7 x  weekly - 2 sets - 5-10 reps - 5 hold - Standing Isometric Cervical Rotation  - 1 x daily - 7 x weekly - 2 sets - 5-10 reps - 5 hold - Seated Isometric Cervical Flexion  - 1 x daily - 7 x weekly - 2 sets - 5-10 reps - 5 hold - Supine Cervical Retraction with Towel  - 1 x daily - 7 x weekly - 2 sets - 10 reps - 5 hold - Standing Shoulder Shrugs with Dumbbells  - 1 x daily - 7 x  weekly - 2 sets - 10 reps - 5 hold - Standing Shoulder Row with Anchored Resistance  - 1 x daily - 7 x weekly - 2 sets - 10 reps - 5 hold  ASSESSMENT:  CLINICAL IMPRESSION:  Patient reports increased neck pain after what she feels was due to poor sleep position.  We took time to integrate cervical stability into core/.  patient was limited in the amount of height she can get in her bridge due to pain in her back.  Utilized Kinesiotape in order to inhibit the upper traps and support the neck a bit better.  Patient continues to benefit from skilled PT in order to manage her symptoms and improve function.    Patient is a 44 y.o. female  who was seen today for physical therapy evaluation and treatment for hypermobility syndrome/hEDS with a complicated medical history. Focus today was on LE and back but does also complain of neck pain and headaches- will complete next visit.   OBJECTIVE IMPAIRMENTS: Abnormal gait, cardiopulmonary status limiting activity, decreased activity tolerance, decreased balance, decreased coordination, decreased endurance, decreased mobility, difficulty walking, decreased strength, improper body mechanics, postural dysfunction, pain, and spasticity/tone, Hypermobility .   ACTIVITY LIMITATIONS: carrying, lifting, bending, sitting, standing, squatting, sleeping, stairs, transfers, reach over head, locomotion level, and caring for others  PARTICIPATION LIMITATIONS: meal prep, cleaning, laundry, interpersonal relationship, driving, shopping, community activity, and occupation  PERSONAL FACTORS: Past/current  experiences, Social background, Time since onset of injury/illness/exacerbation, and 3+ comorbidities: ankylosing spondylitis, multiple sclerosis, EDS, POTS  are also affecting patient's functional outcome.   REHAB POTENTIAL: Good  CLINICAL DECISION MAKING: Unstable/unpredictable  EVALUATION COMPLEXITY: High   GOALS: Goals reviewed with patient? Yes  SHORT TERM GOALS: Target date: 04/15/2024  Patient will be able to show independence for initial HEP to include posture, core and hip strength and stability.   Baseline: Goal status: Ongoing  2.  Patient will be able to tolerate functional testing and set goals (Balance and endurance)  Baseline:  Goal status: Ongoing  3.  Patient will understand and perform proper hip hinge for home tasks (laundry, reaching) and other strategies to reduce pain in back, neck.  Baseline:  Goal status: Ongoing  4.  Patient will notice improved core/abdominal support with functional activities/ADLs.  Baseline:  Goal status: Ongoing  LONG TERM GOALS: Target date: 05/13/2024    Patient will be independent with final HEP upon discharge from PT and report consistent benefit following exercise completion.    Baseline:  Goal status: INITIAL  2.  Patient will be able to demonstrate proper llifting techniques related to spine health and reduction of symptoms, items < 25 lbs from the floor.  Baseline:  Goal status: INITIAL  3.  Patient will be able to stand for 30 min with no more than min increase in hip, back pain  Baseline:  Goal status: INITIAL  4.  Patient will be able to stand in tandem with either leg forward for 30 sec  Baseline: diff with Rt leg back Goal status: INITIAL  5.  Patient will be able to perform 30 seconds sit to stand more than 8 repetitions Baseline:  Goal status: INITIAL  PLAN:  PT FREQUENCY: 2x/week  PT DURATION: 8 weeks  PLANNED INTERVENTIONS: 97164- PT Re-evaluation, 97750- Physical Performance Testing,  97110-Therapeutic exercises, 97530- Therapeutic activity, 97112- Neuromuscular re-education, 97535- Self Care, [id number]- Manual therapy, [phone]- Gait training, [phone]- Electrical stimulation (unattended), Patient/Family education, Balance training, Stair training, Taping, Spinal mobilization, Cryotherapy, and Moist  heat.   PLAN FOR NEXT SESSION:  HEP for core, posture.  Hinging /body mechanics.  Standing posture.  Core stability hip strength   Edgard Debord, PT 04/25/2024, 8:00 AM    Delon Norma, PT 04/25/24 8:00 AM Phone: [phone] Fax: [phone]  "

## 2024-04-30 ENCOUNTER — Ambulatory Visit (INDEPENDENT_AMBULATORY_CARE_PROVIDER_SITE_OTHER): Admitting: Physical Therapy

## 2024-04-30 ENCOUNTER — Encounter: Payer: Self-pay | Admitting: Physical Therapy

## 2024-04-30 DIAGNOSIS — M542 Cervicalgia: Secondary | ICD-10-CM | POA: Diagnosis not present

## 2024-04-30 DIAGNOSIS — M357 Hypermobility syndrome: Secondary | ICD-10-CM | POA: Diagnosis not present

## 2024-04-30 DIAGNOSIS — M25552 Pain in left hip: Secondary | ICD-10-CM | POA: Diagnosis not present

## 2024-04-30 DIAGNOSIS — M6281 Muscle weakness (generalized): Secondary | ICD-10-CM | POA: Diagnosis not present

## 2024-04-30 NOTE — Therapy (Addendum)
 " OUTPATIENT PHYSICAL THERAPY NOTE   Patient Name: Brittney Harrison MRN: [id number] DOB:08/02/1980, 44 y.o., female Today's Date: 04/30/2024  END OF SESSION:  PT End of Session - 04/30/24 1023     Visit Number 5    Number of Visits 16    Date for Recertification  05/13/24    Authorization Type MCR A and B    PT Start Time 1020    PT Stop Time 1101    PT Time Calculation (min) 41 min    Activity Tolerance Patient tolerated treatment well    Behavior During Therapy [facility] for tasks assessed/performed             Past Medical History:  Diagnosis Date   Abnormal Pap smear 2007   CIN-1   ADHD    Ankylosing spondylitis (HCC)    ASCUS with positive high risk HPV 05/2005   Asthma    CIN I (cervical intraepithelial neoplasia I) 06/2005   Clitoral irritation 07/2006   Complication of anesthesia    severe vomiting   DDD (degenerative disc disease), cervical    DDD (degenerative disc disease), lumbar    Fatigue    Fibrocystic breast 2007   Fullness of breast 01/2007   right   GAD (generalized anxiety disorder)    GERD (gastroesophageal reflux disease)    H/O seasonal allergies    H/O varicella    Headache(784.0)    migraines    Hidradenitis suppurativa    Hx: UTI (urinary tract infection)    Increased BMI    Irregular bleeding 10/2005   Multiple sclerosis    Occipital headache    POTS (postural orthostatic tachycardia syndrome)    Sleep apnea    Thyroid  disease    Fluxuate between hyper to hypo   Thyromegaly 11/2005   Vision abnormalities    Yeast vaginitis 07/2006   Past Surgical History:  Procedure Laterality Date   CHOLECYSTECTOMY  09/11/91   DILATION AND CURETTAGE OF UTERUS  2011   endometritis   DILATION AND CURETTAGE OF UTERUS  11/19/2011   Procedure: DILATATION AND CURETTAGE;  Surgeon: Shanda SHAUNNA Muscat, MD;  Location: [facility];  Service: Gynecology;  Laterality: N/A;  dilitation and currettage with repair of intraoperative cervical laceration.   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   Myofascial pain 01/30/2024   Neck pain 06/06/2022   POTS (postural orthostatic tachycardia syndrome) 11/29/2021   Other headache syndrome 11/25/2019   Fever 08/14/2019   Ankylosing spondylitis (HCC) 05/22/2019   High risk medication use 05/22/2019   Pneumonia 06/16/2017   Multifocal pneumonia 06/15/2017   Left optic neuritis 01/23/2017   Multiple sclerosis 10/13/2016   Gait disturbance 10/13/2016   Other fatigue 10/13/2016   Numbness 10/13/2016   Urinary urgency 10/13/2016   Kell isoimmunization during pregnancy    AMA (advanced maternal age) multigravida 35+    Multiple sclerosis complicating pregnancy    Advanced maternal age in multigravida    Vaginal delivery 11/19/2011   PPH (postpartum hemorrhage) 11/19/2011   Hyperthyroidism 07/27/2011   Asthma 07/27/2011   GERD (gastroesophageal reflux disease) 07/27/2011   IBS (irritable bowel syndrome) 07/27/2011   Migraines 07/27/2011   Tobacco abuse 07/27/2011    PCP: Kip Righter, MD   REFERRING PROVIDER: Joane Birmingham MD   REFERRING DIAG: Joane Birmingham MD   Rationale for Evaluation and Treatment: Rehabilitation  THERAPY DIAG:  Hypermobility syndrome  Muscle weakness (generalized)  Pain in left hip  Cervicalgia  ONSET DATE: chronic   SUBJECTIVE:                                                                                                                                                                                           SUBJECTIVE STATEMENT:  Patient had a rough a weekend.  She thinks the tape may have aggravated her occipital neuralgia.  She fell over the weekend as well.  Its been the same for about a year now in my L hip, low back.   Patient was formally diagnosed with Ehlers-Danlos after a complete genetic workup see below.  She presents today for physical  therapy evaluation at the request of Dr. Joane.   She originally brought it up to her Neurologist and did the testing with [facility].  Dr. Jeannetta referred to Dr. Joane who confirmed the diagnosis.    She currently has difficulty with the following activities due to joint instability pain and fatigue:   Standing> 10 min, walking>20-30 min, uses cane, bending forward and coming back up. Diff walking up due to fatigue in mm (low back)  Headaches, pain with cervical motions.  Pain in wrist/hand with Writing.  Clumsy.   Areas most affected are her L hip, low back and Neck Spasticity in her Rt LE>  She would like to be able to stand at a concert and be able to do things without a flare or a 4 day .  I have to plan my life around my pain.   She has had physical therapy before (2021) and results were decent.   She foes a lot of research and is very knowledgeable about her conditions.    Patient reports symptoms and/or instability in the following areas:  - Cervical Spine: [x] Pain [x] Instability [] Limited ROM [] Paresthesia  - Thoracic Spine: [] Pain [] Instability  - Lumbar Spine: [x] Pain [x] Instability [] Frequent locking/giving out  - Shoulder: [] L [] R  [] Subluxation [] Dislocations [] Pain [] Fatigue  - Elbow: [] L [] R  [] Instability [] Hyperextension [] Pain  - Wrist/Hand: [] L [x] R  [] Instability [] Fatigue with use [x] Pain due to OA , pain with gripping, writing. Tight when flared with AS  - Hip: [x] L [x] R  [x] Instability [x] Pain [] Clicking [] Gait deviations , sublux Lt hip  - Knee: [] L [x] R  [] Instability [] Hyperextension [x] Pain OA  - Ankle/Foot: [x] L [] R  [] Instability [] Frequent sprains  X Pain , sublux? Rt foot pain hx     PERTINENT HISTORY:  Reviewed Ehlers-Danlos syndrome genetic panel from the [facility] lab from March 2024. Patient had 22 genes detected typically associated with the variants of  Ehlers-Danlos and Marfan's etc. No reportable or pathological variants were  identified. Report will be sent to scanned in lab section. Cousin has EDS, kids as well, brother?   MSK: Hypermobility evaluation: Beighton score +5/9. EDS evaluation positive score of 6 including unusually soft or velvety skin, mild skin hyperextensibility, unexplained stretch marks, bilateral piezogenic papules, atrophic scarring, dental crowding  History of ankylosing spondylitis, hidradenitis suppurativa, and multiple sclerosis.  Relevant historical information:     PAIN:  Are you having pain? Yes: NPRS scale: 7/10 Pain location: neck bilateral post and lateral to jaw Pain description: sharp, achiness, tightness  Aggravating factors: is constant Relieving factors: medicine, pillow   Are you having pain? Yes: NPRS scale: 4/10-6/10  Pain location: L hip (post)  and back  Pain description: sore, twinge Aggravating factors: moving the wrong way  Relieving factors: repositioning, pillows    PRECAUTIONS: None spasticity in Rt LE   RED FLAGS: None  Some incontinence with GI issues   WEIGHT BEARING RESTRICTIONS: No  FALLS:  Has patient fallen in last 6 months? 3 x in the past week, many near falls    LIVING ENVIRONMENT: Lives with: lives with their family Lives in: House/apartment Stairs: Yes: Internal: unk steps; on right going up Has following equipment at home: Single point cane  OCCUPATION: disability   PLOF: Independent with basic ADLs, Vocation/Vocational requirements: shower chair,, Leisure: concerts, and    PATIENT GOALS: See above  NEXT MD VISIT: 03/22/24  OBJECTIVE:  Note: Objective measures were completed at Evaluation unless otherwise noted.  DIAGNOSTIC FINDINGS:  IMPRESSION: This MRI of the cervical spine with and without contrast shows the following: There is a T2 hyperintense focus posterolaterally to the left within the spinal cord adjacent to C6, unchanged in appearance compared to the 2021 MRI.  It does not enhance. Multilevel degenerative changes  as detailed above that does not lead to spinal stenosis or nerve root compression.  There are endplate degenerative changes with edema and enhancement at C6-C7. Compared to the MRI from 2021, the degenerative changes at C4-C5 have mildly progressed.  The enhancement at C6-C7 is more intense.  Lumbar 2020 IMPRESSION: This MRI of the lumbar spine without contrast shows the following: 1.   There are mild degenerative changes at L3-L4, L4-L5 and L5-S1 that do not lead to nerve root compression though there is mild to moderate foraminal narrowing to the right at L4-L5. 2.   No evidence of demyelination in the conus medullaris.    PATIENT SURVEYS:  NT   COGNITION: Overall cognitive status: Within functional limits for tasks assessed     SENSATION: WFL   POSTURE: rounded shoulders, forward head, and swayback   PALPATION: Hypermobility in Lumbar segments and pain  Pain Lt lateral SI border> Rt    LUMBAR ROM:   AROM eval  Flexion WNL palms flat  Extension WNL pain central   Right lateral flexion Stiff 25%   Left lateral flexion Stiff 25% Pain   Right rotation WFL  Left rotation WFL    (Blank rows = not tested)  LOWER EXTREMITY ROM:   WFL AROM  Passive  Right eval Left eval  Hip flexion Pinching ant  Pinching ant   Hip extension    Hip abduction    Hip adduction    Hip internal rotation Hyper with pain Hyper with pain   Hip external rotation hyper hyper  Knee flexion    Knee extension    Ankle dorsiflexion    Ankle plantarflexion  Ankle inversion    Ankle eversion     (Blank rows = not tested)  LOWER EXTREMITY MMT:    MMT Right eval Left eval  Hip flexion 4 4-  Hip extension 3+ 3+  Hip abduction 4 3+  Hip adduction    Hip internal rotation    Hip external rotation    Knee flexion 4+ 4+  Knee extension 5 5  Ankle dorsiflexion 5 5  Ankle plantarflexion    Ankle inversion    Ankle eversion     (Blank rows = not tested)  LUMBAR SPECIAL TESTS:  Straight  leg raise test: Negative, Single leg stance test: Positive, and FABER test: Positive L SIJ  Pain in L low back with SLR    CERVICAL ROM: NT   Active ROM A/PROM (deg) 04/30/2024  Flexion 54 pulling  Extension 65 min central  Right lateral flexion 40  Left lateral flexion 36  Right rotation WFL crispies  Left rotation WFL crispies   (Blank rows = not tested)  UE ROM: WNL  Active ROM Right 04/30/2024 Left 04/30/2024  Shoulder flexion    Shoulder extension    Shoulder abduction    Shoulder adduction    Shoulder extension    Shoulder internal rotation    Shoulder external rotation    Elbow flexion    Elbow extension    Wrist flexion    Wrist extension    Wrist ulnar deviation    Wrist radial deviation    Wrist pronation    Wrist supination     (Blank rows = not tested)  UE MMT:  MMT Right 04/30/2024 Left 04/30/2024  Shoulder flexion 4- 3+  Shoulder extension    Shoulder abduction 3+ 3+  Shoulder adduction    Shoulder extension    Shoulder internal rotation 4+ 4+  Shoulder external rotation 4+ 4  Middle trapezius    Lower trapezius    Elbow flexion 4+ 4+  Elbow extension 4 4  Wrist flexion    Wrist extension    Wrist ulnar deviation    Wrist radial deviation    Wrist pronation    Wrist supination    Grip strength 43 39   (Blank rows = not tested)  CERVICAL SPECIAL TESTS:  NT   FUNCTIONAL TESTS:  5 times sit to stand: 23 sec SLS Rt LE 10 sec, Lt. < 5 sec  Cervical DNF 5 sec    GAIT: Distance walked: 100 Assistive device utilized: Single point cane Level of assistance: Complete Independence Comments: slow pace   Berg Balance  Total Score: 53/56 patient uses a cane due to right leg muscle spasms and when she is uncertain of the location.  She does not use a device in her home  TREATMENT DATE:   [facility] Adult PT Treatment:                                                DATE: 04/25/24 Therapeutic Exercise: Squat with slight turnout x 15 countertop   Hip hinging with cane, foam roller Wall sit 30 sec x 3  Row 5 lbs bench kneeling x 12 each side  Supine march with 5 lbs DB SLR with 5 lbs DB bilateral - unable to do this with wgt, 8-10 Bridging x 10  Self Care: DOMS vs PEM  Flat back hinging, posture and body  mechanics   [facility] Adult PT Treatment:                                                DATE: 04/23/24 Manual Therapy: KT tape to upper traps  1 strip I with 50% in the center 1 Y strip post cervical to facilitate cervical ext  Therapeutic Activity: Cervical rotation Chin tuck x 10 into towel  Added arm arcs and horizontal abduction double and single  Femur Arcs x 10 each added band to reduce instability  Dead bug to 90 deg hip and shoulder  Banded bridge x 10  Bridge unable to get to 75% due to pain in back  Open book each side added bent elbow x 5  Cervical towel stretches side bending/rot. (upper trap, levator)  and flex/ext Piriformis stretches seated fig 4                                                                                                   PATIENT EDUCATION:  Education details: Posture, alignment, neutral zone and joint protection PNE/Explain pain   Joint inflammation/collagen/ligament laxity, muscular support Stretching vs stabilizing  Person educated: Patient Education method: Explanation, Demonstration, and Handouts Education comprehension: verbalized understanding and returned demonstration   HOME EXERCISE PROGRAM: Access Code: [email or web address] URL: [email or web address] Date: 03/18/2024 Prepared by: Delon Norma  Exercises - Hooklying Transversus Abdominis Palpation  - 1 x daily - 7 x weekly - 2 sets - 10 reps - 5 hold - Supine March  - 1 x daily - 7 x weekly - 2 sets - 10 reps - 5 hold - Seated Transversus Abdominis Bracing  - 1 x daily - 7 x weekly - 2 sets - 10 reps - 5 hold - Standing Isometric Cervical Sidebending with Manual Resistance  - 1 x daily - 7 x weekly - 2 sets -  5-10 reps - 5 hold - Standing Isometric Cervical Rotation  - 1 x daily - 7 x weekly - 2 sets - 5-10 reps - 5 hold - Seated Isometric Cervical Flexion  - 1 x daily - 7 x weekly - 2 sets - 5-10 reps - 5 hold - Supine Cervical Retraction with Towel  - 1 x daily - 7 x weekly - 2 sets - 10 reps - 5 hold - Standing Shoulder Shrugs with Dumbbells  - 1 x daily - 7 x weekly - 2 sets - 10 reps - 5 hold - Standing Shoulder Row with Anchored Resistance  - 1 x daily - 7 x weekly - 2 sets - 10 reps - 5 hold  ASSESSMENT:  CLINICAL IMPRESSION:   Patient unfortunately had an increase in neck pain, Occipital neuralgia which interfered with her ability to participate in her weekend celebrations.  She was able to work on strengthening exercises today.  She needs cues for maintaining a flat back for hinging and bench rows. Fatigue in lower body, will let me know how long the post  session soreness lasts.     Patient is a 44 y.o. female  who was seen today for physical therapy evaluation and treatment for hypermobility syndrome/hEDS with a complicated medical history. Focus today was on LE and back but does also complain of neck pain and headaches- will complete next visit.   OBJECTIVE IMPAIRMENTS: Abnormal gait, cardiopulmonary status limiting activity, decreased activity tolerance, decreased balance, decreased coordination, decreased endurance, decreased mobility, difficulty walking, decreased strength, improper body mechanics, postural dysfunction, pain, and spasticity/tone, Hypermobility .   ACTIVITY LIMITATIONS: carrying, lifting, bending, sitting, standing, squatting, sleeping, stairs, transfers, reach over head, locomotion level, and caring for others  PARTICIPATION LIMITATIONS: meal prep, cleaning, laundry, interpersonal relationship, driving, shopping, community activity, and occupation  PERSONAL FACTORS: Past/current experiences, Social background, Time since onset of injury/illness/exacerbation, and 3+  comorbidities: ankylosing spondylitis, multiple sclerosis, EDS, POTS  are also affecting patient's functional outcome.   REHAB POTENTIAL: Good  CLINICAL DECISION MAKING: Unstable/unpredictable  EVALUATION COMPLEXITY: High   GOALS: Goals reviewed with patient? Yes  SHORT TERM GOALS: Target date: 04/15/2024  Patient will be able to show independence for initial HEP to include posture, core and hip strength and stability.   Baseline: Goal status: Ongoing  2.  Patient will be able to tolerate functional testing and set goals (Balance and endurance)  Baseline:  Goal status: Ongoing  3.  Patient will understand and perform proper hip hinge for home tasks (laundry, reaching) and other strategies to reduce pain in back, neck.  Baseline:  Goal status: Ongoing  4.  Patient will notice improved core/abdominal support with functional activities/ADLs.  Baseline:  Goal status: Ongoing  LONG TERM GOALS: Target date: 05/13/2024    Patient will be independent with final HEP upon discharge from PT and report consistent benefit following exercise completion.    Baseline:  Goal status: INITIAL  2.  Patient will be able to demonstrate proper llifting techniques related to spine health and reduction of symptoms, items < 25 lbs from the floor.  Baseline:  Goal status: INITIAL  3.  Patient will be able to stand for 30 min with no more than min increase in hip, back pain  Baseline:  Goal status: INITIAL  4.  Patient will be able to stand in tandem with either leg forward for 30 sec  Baseline: diff with Rt leg back Goal status: INITIAL  5.  Patient will be able to perform 30 seconds sit to stand more than 8 repetitions Baseline:  Goal status: INITIAL  PLAN:  PT FREQUENCY: 2x/week  PT DURATION: 8 weeks  PLANNED INTERVENTIONS: [id number]- PT Re-evaluation, 97750- Physical Performance Testing, 97110-Therapeutic exercises, 97530- Therapeutic activity, [id number]- Neuromuscular re-education, 97535-  Self Care, [id number]- Manual therapy, [id number]- Gait training, [phone]- Electrical stimulation (unattended), Patient/Family education, Balance training, Stair training, Taping, Spinal mobilization, Cryotherapy, and Moist heat.   PLAN FOR NEXT SESSION:  Cont strength. HEP for core, posture.  Hinging /body mechanics.  Standing posture.  Core stability/ hip strength   Stephone Gum, PT 04/30/2024, 11:00 AM    Delon Norma, PT 04/30/24 11:00 AM Phone: [phone] Fax: [phone]  "

## 2024-05-01 ENCOUNTER — Encounter: Payer: Self-pay | Admitting: Internal Medicine

## 2024-05-01 ENCOUNTER — Ambulatory Visit: Attending: Internal Medicine | Admitting: Internal Medicine

## 2024-05-01 VITALS — BP 160/93 | HR 85 | Temp 97.6°F | Resp 14 | Ht 68.0 in | Wt 199.8 lb

## 2024-05-01 DIAGNOSIS — M459 Ankylosing spondylitis of unspecified sites in spine: Secondary | ICD-10-CM | POA: Insufficient documentation

## 2024-05-01 DIAGNOSIS — G35D Multiple sclerosis, unspecified: Secondary | ICD-10-CM | POA: Diagnosis present

## 2024-05-01 DIAGNOSIS — L732 Hidradenitis suppurativa: Secondary | ICD-10-CM | POA: Insufficient documentation

## 2024-05-02 ENCOUNTER — Ambulatory Visit: Admitting: Physical Therapy

## 2024-05-02 ENCOUNTER — Encounter: Payer: Self-pay | Admitting: Physical Therapy

## 2024-05-02 DIAGNOSIS — M6281 Muscle weakness (generalized): Secondary | ICD-10-CM

## 2024-05-02 DIAGNOSIS — M542 Cervicalgia: Secondary | ICD-10-CM | POA: Diagnosis not present

## 2024-05-02 DIAGNOSIS — M357 Hypermobility syndrome: Secondary | ICD-10-CM | POA: Diagnosis not present

## 2024-05-02 DIAGNOSIS — M25552 Pain in left hip: Secondary | ICD-10-CM

## 2024-05-02 LAB — COMPREHENSIVE METABOLIC PANEL WITH GFR
AG Ratio: 1.7 (calc) (ref 1.0–2.5)
ALT: 17 U/L (ref 6–29)
AST: 15 U/L (ref 10–35)
Albumin: 4.4 g/dL (ref 3.6–5.1)
Alkaline phosphatase (APISO): 85 U/L (ref 31–125)
BUN: 8 mg/dL (ref 7–25)
CO2: 26 mmol/L (ref 20–32)
Calcium: 9.7 mg/dL (ref 8.6–10.2)
Chloride: 103 mmol/L (ref 98–110)
Creat: 0.69 mg/dL (ref 0.50–0.99)
Globulin: 2.6 g/dL (ref 1.9–3.7)
Glucose, Bld: 107 mg/dL — ABNORMAL HIGH (ref 65–99)
Potassium: 3.9 mmol/L (ref 3.5–5.3)
Sodium: 137 mmol/L (ref 135–146)
Total Bilirubin: 0.4 mg/dL (ref 0.2–1.2)
Total Protein: 7 g/dL (ref 6.1–8.1)
eGFR: 109 mL/min/1.73m2

## 2024-05-02 LAB — CBC WITH DIFFERENTIAL/PLATELET
Absolute Lymphocytes: 3961 {cells}/uL — ABNORMAL HIGH (ref 850–3900)
Absolute Monocytes: 607 {cells}/uL (ref 200–950)
Basophils Absolute: 69 {cells}/uL (ref 0–200)
Basophils Relative: 0.5 %
Eosinophils Absolute: 138 {cells}/uL (ref 15–500)
Eosinophils Relative: 1 %
HCT: 46.7 % — ABNORMAL HIGH (ref 35.9–46.0)
Hemoglobin: 15.6 g/dL — ABNORMAL HIGH (ref 11.7–15.5)
MCH: 30.6 pg (ref 27.0–33.0)
MCHC: 33.4 g/dL (ref 31.6–35.4)
MCV: 91.7 fL (ref 81.4–101.7)
MPV: 10 fL (ref 7.5–12.5)
Monocytes Relative: 4.4 %
Neutro Abs: 9025 {cells}/uL — ABNORMAL HIGH (ref 1500–7800)
Neutrophils Relative %: 65.4 %
Platelets: 273 Thousand/uL (ref 140–400)
RBC: 5.09 Million/uL (ref 3.80–5.10)
RDW: 13.1 % (ref 11.0–15.0)
Total Lymphocyte: 28.7 %
WBC: 13.8 Thousand/uL — ABNORMAL HIGH (ref 3.8–10.8)

## 2024-05-02 LAB — SEDIMENTATION RATE: Sed Rate: 2 mm/h (ref 0–20)

## 2024-05-02 NOTE — Therapy (Signed)
 " OUTPATIENT PHYSICAL THERAPY NOTE   Patient Name: Brittney Harrison MRN: [id number] DOB:08/05/1980, 44 y.o., female Today's Date: 05/02/2024  END OF SESSION:  PT End of Session - 05/02/24 1020     Visit Number 6    Number of Visits 16    Date for Recertification  05/13/24    Authorization Type MCR A and B    PT Start Time 1016    PT Stop Time 1100    PT Time Calculation (min) 44 min    Activity Tolerance Patient tolerated treatment well    Behavior During Therapy [facility] for tasks assessed/performed           Past Medical History:  Diagnosis Date   Abnormal Pap smear 2007   CIN-1   ADHD    Ankylosing spondylitis (HCC)    ASCUS with positive high risk HPV 05/2005   Asthma    CIN I (cervical intraepithelial neoplasia I) 06/2005   Clitoral irritation 07/2006   Complication of anesthesia    severe vomiting   DDD (degenerative disc disease), cervical    DDD (degenerative disc disease), lumbar    Fatigue    Fibrocystic breast 2007   Fullness of breast 01/2007   right   GAD (generalized anxiety disorder)    GERD (gastroesophageal reflux disease)    H/O seasonal allergies    H/O varicella    Headache(784.0)    migraines    Hidradenitis suppurativa    Hx: UTI (urinary tract infection)    Increased BMI    Irregular bleeding 10/2005   Multiple sclerosis    Occipital headache    POTS (postural orthostatic tachycardia syndrome)    Sleep apnea    Thyroid  disease    Fluxuate between hyper to hypo   Thyromegaly 11/2005   Vision abnormalities    Yeast vaginitis 07/2006   Past Surgical History:  Procedure Laterality Date   CHOLECYSTECTOMY  09/11/91   DILATION AND CURETTAGE OF UTERUS  2011   endometritis   DILATION AND CURETTAGE OF UTERUS  11/19/2011   Procedure: DILATATION AND CURETTAGE;  Surgeon: Shanda SHAUNNA Muscat, MD;  Location: [facility];  Service: Gynecology;  Laterality: N/A;  dilitation and currettage with repair of intraoperative cervical laceration.   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   Myofascial pain 01/30/2024   Neck pain 06/06/2022   POTS (postural orthostatic tachycardia syndrome) 11/29/2021   Other headache syndrome 11/25/2019   Fever 08/14/2019   Ankylosing spondylitis (HCC) 05/22/2019   High risk medication use 05/22/2019   Pneumonia 06/16/2017   Multifocal pneumonia 06/15/2017   Left optic neuritis 01/23/2017   Multiple sclerosis 10/13/2016   Gait disturbance 10/13/2016   Other fatigue 10/13/2016   Numbness 10/13/2016   Urinary urgency 10/13/2016   Kell isoimmunization during pregnancy    AMA (advanced maternal age) multigravida 35+    Multiple sclerosis complicating pregnancy    Advanced maternal age in multigravida    Vaginal delivery 11/19/2011   PPH (postpartum hemorrhage) 11/19/2011   Hyperthyroidism 07/27/2011   Asthma 07/27/2011   GERD (gastroesophageal reflux disease) 07/27/2011   IBS (irritable bowel syndrome) 07/27/2011   Migraines 07/27/2011   Tobacco abuse 07/27/2011    PCP: Kip Righter, MD   REFERRING PROVIDER: Joane Birmingham MD   REFERRING DIAG: Joane Birmingham MD   Rationale for Evaluation and Treatment: Rehabilitation  THERAPY DIAG:  Hypermobility  syndrome  Muscle weakness (generalized)  Pain in left hip  Cervicalgia  ONSET DATE: chronic   SUBJECTIVE:                                                                                                                                                                                           SUBJECTIVE STATEMENT:  Going to try to quit smoking again.  She saw Dr. Jeannetta and that was good.   L sided hip pain.  I was incredibly sore yesterday, today is better.      Patient reports symptoms and/or instability in the following areas:  - Cervical Spine: [x] Pain [x] Instability [] Limited ROM [] Paresthesia  - Thoracic Spine: [] Pain [] Instability  -  Lumbar Spine: [x] Pain [x] Instability [] Frequent locking/giving out  - Shoulder: [] L [] R  [] Subluxation [] Dislocations [] Pain [] Fatigue  - Elbow: [] L [] R  [] Instability [] Hyperextension [] Pain  - Wrist/Hand: [] L [x] R  [] Instability [] Fatigue with use [x] Pain due to OA , pain with gripping, writing. Tight when flared with AS  - Hip: [x] L [x] R  [x] Instability [x] Pain [] Clicking [] Gait deviations , sublux Lt hip  - Knee: [] L [x] R  [] Instability [] Hyperextension [x] Pain OA  - Ankle/Foot: [x] L [] R  [] Instability [] Frequent sprains  X Pain , sublux? Rt foot pain hx     PERTINENT HISTORY:  Reviewed Ehlers-Danlos syndrome genetic panel from the [facility] lab from March 2024. Patient had 22 genes detected typically associated with the variants of Ehlers-Danlos and Marfan's etc. No reportable or pathological variants were identified. Report will be sent to scanned in lab section. Cousin has EDS, kids as well, brother?   MSK: Hypermobility evaluation: Beighton score +5/9. EDS evaluation positive score of 6 including unusually soft or velvety skin, mild skin hyperextensibility, unexplained stretch marks, bilateral piezogenic papules, atrophic scarring, dental crowding  History of ankylosing spondylitis, hidradenitis suppurativa, and multiple sclerosis.  Relevant historical information:     PAIN:  Are you having pain? Yes: NPRS scale: 7/10 Pain location: neck bilateral post and lateral to jaw Pain description: sharp, achiness, tightness  Aggravating factors: is constant Relieving factors: medicine, pillow   Are you having pain? Yes: NPRS scale: 5/10  Pain location: L hip (post)  and back  Pain description: sore, twinge Aggravating factors: moving the wrong way  Relieving factors: repositioning, pillows    PRECAUTIONS: None spasticity in Rt LE   RED FLAGS: None  Some incontinence with GI issues   WEIGHT BEARING RESTRICTIONS: No  FALLS:  Has patient fallen in last 6 months? 3  x in the past week, many near falls    LIVING ENVIRONMENT: Lives with: lives with their family Lives in:  House/apartment Stairs: Yes: Internal: unk steps; on right going up Has following equipment at home: Single point cane  OCCUPATION: disability   PLOF: Independent with basic ADLs, Vocation/Vocational requirements: shower chair,, Leisure: concerts, and    PATIENT GOALS: See above  NEXT MD VISIT: 03/22/24  OBJECTIVE:  Note: Objective measures were completed at Evaluation unless otherwise noted.  DIAGNOSTIC FINDINGS:  IMPRESSION: This MRI of the cervical spine with and without contrast shows the following: There is a T2 hyperintense focus posterolaterally to the left within the spinal cord adjacent to C6, unchanged in appearance compared to the 2021 MRI.  It does not enhance. Multilevel degenerative changes as detailed above that does not lead to spinal stenosis or nerve root compression.  There are endplate degenerative changes with edema and enhancement at C6-C7. Compared to the MRI from 2021, the degenerative changes at C4-C5 have mildly progressed.  The enhancement at C6-C7 is more intense.  Lumbar 2020 IMPRESSION: This MRI of the lumbar spine without contrast shows the following: 1.   There are mild degenerative changes at L3-L4, L4-L5 and L5-S1 that do not lead to nerve root compression though there is mild to moderate foraminal narrowing to the right at L4-L5. 2.   No evidence of demyelination in the conus medullaris.    PATIENT SURVEYS:  NT   COGNITION: Overall cognitive status: Within functional limits for tasks assessed     SENSATION: WFL   POSTURE: rounded shoulders, forward head, and swayback   PALPATION: Hypermobility in Lumbar segments and pain  Pain Lt lateral SI border> Rt    LUMBAR ROM:   AROM eval  Flexion WNL palms flat  Extension WNL pain central   Right lateral flexion Stiff 25%   Left lateral flexion Stiff 25% Pain   Right rotation WFL   Left rotation WFL    (Blank rows = not tested)  LOWER EXTREMITY ROM:   WFL AROM  Passive  Right eval Left eval  Hip flexion Pinching ant  Pinching ant   Hip extension    Hip abduction    Hip adduction    Hip internal rotation Hyper with pain Hyper with pain   Hip external rotation hyper hyper  Knee flexion    Knee extension    Ankle dorsiflexion    Ankle plantarflexion    Ankle inversion    Ankle eversion     (Blank rows = not tested)  LOWER EXTREMITY MMT:    MMT Right eval Left eval  Hip flexion 4 4-  Hip extension 3+ 3+  Hip abduction 4 3+  Hip adduction    Hip internal rotation    Hip external rotation    Knee flexion 4+ 4+  Knee extension 5 5  Ankle dorsiflexion 5 5  Ankle plantarflexion    Ankle inversion    Ankle eversion     (Blank rows = not tested)  LUMBAR SPECIAL TESTS:  Straight leg raise test: Negative, Single leg stance test: Positive, and FABER test: Positive L SIJ  Pain in L low back with SLR    CERVICAL ROM: NT   Active ROM A/PROM (deg) 05/02/2024  Flexion 54 pulling  Extension 65 min central  Right lateral flexion 40  Left lateral flexion 36  Right rotation WFL crispies  Left rotation WFL crispies   (Blank rows = not tested)  UE ROM: WNL  Active ROM Right 05/02/2024 Left 05/02/2024  Shoulder flexion    Shoulder extension    Shoulder abduction  Shoulder adduction    Shoulder extension    Shoulder internal rotation    Shoulder external rotation    Elbow flexion    Elbow extension    Wrist flexion    Wrist extension    Wrist ulnar deviation    Wrist radial deviation    Wrist pronation    Wrist supination     (Blank rows = not tested)  UE MMT:  MMT Right 05/02/2024 Left 05/02/2024  Shoulder flexion 4- 3+  Shoulder extension    Shoulder abduction 3+ 3+  Shoulder adduction    Shoulder extension    Shoulder internal rotation 4+ 4+  Shoulder external rotation 4+ 4  Middle trapezius    Lower trapezius    Elbow  flexion 4+ 4+  Elbow extension 4 4  Wrist flexion    Wrist extension    Wrist ulnar deviation    Wrist radial deviation    Wrist pronation    Wrist supination    Grip strength 43 39   (Blank rows = not tested)  CERVICAL SPECIAL TESTS:  NT   FUNCTIONAL TESTS:  5 times sit to stand: 23 sec SLS Rt LE 10 sec, Lt. < 5 sec  Cervical DNF 5 sec    GAIT: Distance walked: 100 Assistive device utilized: Single point cane Level of assistance: Complete Independence Comments: slow pace   Berg Balance  Total Score: 53/56 patient uses a cane due to right leg muscle spasms and when she is uncertain of the location.  She does not use a device in her home  TREATMENT DATE:    [facility] Adult PT Treatment:                                                DATE: 05/02/24  Therapeutic Activity: Pilates Tower for LE/Core strength, postural strength, lumbopelvic disassociation and core control.  Exercises included: Supine Leg Springs  Feet in straps yellow Arcs, circles singe and then double  Frog squats   Arm Springs yellow- arcs x 8 then added partial bridge x 8  Bridge with ball was pinchy in SIJ, added a blue band for pressing out  3 way hip stretch with strap    [facility] Adult PT Treatment:                                                DATE: 04/25/24 Therapeutic Exercise: Squat with slight turnout x 15 countertop  Hip hinging with cane, foam roller Wall sit 30 sec x 3  Row 5 lbs bench kneeling x 12 each side  Supine march with 5 lbs DB SLR with 5 lbs DB bilateral - unable to do this with wgt, 8-10 Bridging x 10  Self Care: DOMS vs PEM  Flat back hinging, posture and body mechanics   [facility] Adult PT Treatment:                                                DATE: 04/23/24 Manual Therapy: KT tape to upper traps  1 strip I with 50% in the center 1 Y strip  post cervical to facilitate cervical ext  Therapeutic Activity: Cervical rotation Chin tuck x 10 into towel  Added arm arcs and  horizontal abduction double and single  Femur Arcs x 10 each added band to reduce instability  Dead bug to 90 deg hip and shoulder  Banded bridge x 10  Bridge unable to get to 75% due to pain in back  Open book each side added bent elbow x 5  Cervical towel stretches side bending/rot. (upper trap, levator)  and flex/ext Piriformis stretches seated fig 4                                                                                                   PATIENT EDUCATION:  Education details: Posture, alignment, neutral zone and joint protection PNE/Explain pain   Joint inflammation/collagen/ligament laxity, muscular support Stretching vs stabilizing  Person educated: Patient Education method: Explanation, Demonstration, and Handouts Education comprehension: verbalized understanding and returned demonstration   HOME EXERCISE PROGRAM: Access Code: [email or web address] URL: [email or web address] Date: 03/18/2024 Prepared by: Delon Norma  Exercises - Hooklying Transversus Abdominis Palpation  - 1 x daily - 7 x weekly - 2 sets - 10 reps - 5 hold - Supine March  - 1 x daily - 7 x weekly - 2 sets - 10 reps - 5 hold - Seated Transversus Abdominis Bracing  - 1 x daily - 7 x weekly - 2 sets - 10 reps - 5 hold - Standing Isometric Cervical Sidebending with Manual Resistance  - 1 x daily - 7 x weekly - 2 sets - 5-10 reps - 5 hold - Standing Isometric Cervical Rotation  - 1 x daily - 7 x weekly - 2 sets - 5-10 reps - 5 hold - Seated Isometric Cervical Flexion  - 1 x daily - 7 x weekly - 2 sets - 5-10 reps - 5 hold - Supine Cervical Retraction with Towel  - 1 x daily - 7 x weekly - 2 sets - 10 reps - 5 hold - Standing Shoulder Shrugs with Dumbbells  - 1 x daily - 7 x weekly - 2 sets - 10 reps - 5 hold - Standing Shoulder Row with Anchored Resistance  - 1 x daily - 7 x weekly - 2 sets - 10 reps - 5 hold  ASSESSMENT:  CLINICAL IMPRESSION:  Patient was introduced to the Pilates Tower today,  noted excessive tension in bilateral hamstrings, Rt sided adductor pain.  Unable to do bridging with ball squeeze due to SIJ pain blue band made it a bit easier. Cont POC.    Patient is a 44 y.o. female  who was seen today for physical therapy evaluation and treatment for hypermobility syndrome/hEDS with a complicated medical history. Focus today was on LE and back but does also complain of neck pain and headaches- will complete next visit.   OBJECTIVE IMPAIRMENTS: Abnormal gait, cardiopulmonary status limiting activity, decreased activity tolerance, decreased balance, decreased coordination, decreased endurance, decreased mobility, difficulty walking, decreased strength, improper body mechanics, postural dysfunction, pain, and spasticity/tone, Hypermobility .   ACTIVITY LIMITATIONS: carrying, lifting, bending, sitting, standing, squatting,  sleeping, stairs, transfers, reach over head, locomotion level, and caring for others  PARTICIPATION LIMITATIONS: meal prep, cleaning, laundry, interpersonal relationship, driving, shopping, community activity, and occupation  PERSONAL FACTORS: Past/current experiences, Social background, Time since onset of injury/illness/exacerbation, and 3+ comorbidities: ankylosing spondylitis, multiple sclerosis, EDS, POTS  are also affecting patient's functional outcome.   REHAB POTENTIAL: Good  CLINICAL DECISION MAKING: Unstable/unpredictable  EVALUATION COMPLEXITY: High   GOALS: Goals reviewed with patient? Yes  SHORT TERM GOALS: Target date: 04/15/2024  Patient will be able to show independence for initial HEP to include posture, core and hip strength and stability.   Baseline: Goal status: Ongoing  2.  Patient will be able to tolerate functional testing and set goals (Balance and endurance)  Baseline:  Goal status: Ongoing  3.  Patient will understand and perform proper hip hinge for home tasks (laundry, reaching) and other strategies to reduce pain in  back, neck.  Baseline:  Goal status: Ongoing  4.  Patient will notice improved core/abdominal support with functional activities/ADLs.  Baseline:  Goal status: Ongoing  LONG TERM GOALS: Target date: 05/13/2024    Patient will be independent with final HEP upon discharge from PT and report consistent benefit following exercise completion.    Baseline:  Goal status: INITIAL  2.  Patient will be able to demonstrate proper llifting techniques related to spine health and reduction of symptoms, items < 25 lbs from the floor.  Baseline:  Goal status: INITIAL  3.  Patient will be able to stand for 30 min with no more than min increase in hip, back pain  Baseline:  Goal status: INITIAL  4.  Patient will be able to stand in tandem with either leg forward for 30 sec  Baseline: diff with Rt leg back Goal status: INITIAL  5.  Patient will be able to perform 30 seconds sit to stand more than 8 repetitions Baseline:  Goal status: INITIAL  PLAN:  PT FREQUENCY: 2x/week  PT DURATION: 8 weeks  PLANNED INTERVENTIONS: [id number]- PT Re-evaluation, 97750- Physical Performance Testing, 97110-Therapeutic exercises, 97530- Therapeutic activity, [id number]- Neuromuscular re-education, 97535- Self Care, [id number]- Manual therapy, [id number]- Gait training, [phone]- Electrical stimulation (unattended), Patient/Family education, Balance training, Stair training, Taping, Spinal mobilization, Cryotherapy, and Moist heat.   PLAN FOR NEXT SESSION:  Cont strength. HEP for core, posture.  Hinging /body mechanics.  Standing posture.  Core stability/ hip strength   Tametra Ahart, PT 05/02/2024, 10:58 AM    Delon Norma, PT 05/02/24 10:58 AM Phone: [phone] Fax: [phone]  "

## 2024-05-11 ENCOUNTER — Ambulatory Visit: Payer: Self-pay | Admitting: Internal Medicine

## 2024-05-17 ENCOUNTER — Encounter: Admitting: Physical Therapy

## 2024-05-21 ENCOUNTER — Encounter: Admitting: Physical Therapy

## 2024-05-23 ENCOUNTER — Encounter: Admitting: Physical Therapy

## 2024-05-28 ENCOUNTER — Encounter: Admitting: Physical Therapy

## 2024-05-30 ENCOUNTER — Encounter: Admitting: Physical Therapy

## 2024-06-04 ENCOUNTER — Encounter: Admitting: Physical Therapy

## 2024-06-06 ENCOUNTER — Encounter: Admitting: Physical Therapy

## 2024-06-20 ENCOUNTER — Ambulatory Visit: Admitting: Family Medicine

## 2024-07-30 ENCOUNTER — Ambulatory Visit: Admitting: Internal Medicine

## 2024-08-01 ENCOUNTER — Ambulatory Visit: Admitting: Neurology
